# Patient Record
Sex: Male | Born: 1955
Health system: Southern US, Community
[De-identification: ages and names within clinical notes are randomized; demographics above are authoritative.]

## PROBLEM LIST (undated history)

## (undated) DIAGNOSIS — T8859XA Other complications of anesthesia, initial encounter: Secondary | ICD-10-CM

## (undated) DIAGNOSIS — M1990 Unspecified osteoarthritis, unspecified site: Secondary | ICD-10-CM

## (undated) DIAGNOSIS — K219 Gastro-esophageal reflux disease without esophagitis: Secondary | ICD-10-CM

## (undated) DIAGNOSIS — G8929 Other chronic pain: Secondary | ICD-10-CM

## (undated) DIAGNOSIS — I2510 Atherosclerotic heart disease of native coronary artery without angina pectoris: Secondary | ICD-10-CM

## (undated) DIAGNOSIS — I10 Essential (primary) hypertension: Secondary | ICD-10-CM

## (undated) DIAGNOSIS — T4145XA Adverse effect of unspecified anesthetic, initial encounter: Secondary | ICD-10-CM

## (undated) DIAGNOSIS — Z87442 Personal history of urinary calculi: Secondary | ICD-10-CM

## (undated) DIAGNOSIS — J309 Allergic rhinitis, unspecified: Secondary | ICD-10-CM

## (undated) DIAGNOSIS — D696 Thrombocytopenia, unspecified: Secondary | ICD-10-CM

## (undated) DIAGNOSIS — R519 Headache, unspecified: Secondary | ICD-10-CM

## (undated) DIAGNOSIS — E119 Type 2 diabetes mellitus without complications: Secondary | ICD-10-CM

## (undated) DIAGNOSIS — Z9889 Other specified postprocedural states: Secondary | ICD-10-CM

## (undated) DIAGNOSIS — G4730 Sleep apnea, unspecified: Secondary | ICD-10-CM

## (undated) DIAGNOSIS — R112 Nausea with vomiting, unspecified: Secondary | ICD-10-CM

## (undated) DIAGNOSIS — K648 Other hemorrhoids: Secondary | ICD-10-CM

## (undated) DIAGNOSIS — IMO0002 Reserved for concepts with insufficient information to code with codable children: Secondary | ICD-10-CM

## (undated) DIAGNOSIS — E785 Hyperlipidemia, unspecified: Secondary | ICD-10-CM

## (undated) DIAGNOSIS — R51 Headache: Secondary | ICD-10-CM

## (undated) HISTORY — DX: Other chronic pain: G89.29

## (undated) HISTORY — PX: TONSILLECTOMY AND ADENOIDECTOMY: SUR1326

## (undated) HISTORY — DX: Allergic rhinitis, unspecified: J30.9

## (undated) HISTORY — DX: Essential (primary) hypertension: I10

## (undated) HISTORY — DX: Personal history of urinary calculi: Z87.442

## (undated) HISTORY — PX: SHOULDER SURGERY: SHX246

## (undated) HISTORY — DX: Thrombocytopenia, unspecified: D69.6

## (undated) HISTORY — DX: Reserved for concepts with insufficient information to code with codable children: IMO0002

## (undated) HISTORY — PX: VASECTOMY: SHX75

## (undated) HISTORY — DX: Headache, unspecified: R51.9

## (undated) HISTORY — DX: Headache: R51

## (undated) HISTORY — DX: Type 2 diabetes mellitus without complications: E11.9

## (undated) HISTORY — DX: Other hemorrhoids: K64.8

## (undated) HISTORY — DX: Hyperlipidemia, unspecified: E78.5

## (undated) HISTORY — DX: Unspecified osteoarthritis, unspecified site: M19.90

## (undated) HISTORY — DX: Gastro-esophageal reflux disease without esophagitis: K21.9

---
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
## 1999-11-07 ENCOUNTER — Encounter: Payer: Self-pay | Admitting: Internal Medicine

## 1999-11-07 ENCOUNTER — Ambulatory Visit (HOSPITAL_COMMUNITY): Admission: RE | Admit: 1999-11-07 | Discharge: 1999-11-07 | Payer: Self-pay | Admitting: Internal Medicine

## 2000-07-31 ENCOUNTER — Ambulatory Visit (HOSPITAL_COMMUNITY): Admission: RE | Admit: 2000-07-31 | Discharge: 2000-07-31 | Payer: Self-pay | Admitting: Specialist

## 2000-07-31 ENCOUNTER — Encounter: Payer: Self-pay | Admitting: Specialist

## 2001-08-06 ENCOUNTER — Ambulatory Visit (HOSPITAL_COMMUNITY): Admission: RE | Admit: 2001-08-06 | Discharge: 2001-08-06 | Payer: Self-pay | Admitting: Internal Medicine

## 2001-08-06 ENCOUNTER — Encounter: Payer: Self-pay | Admitting: Internal Medicine

## 2001-10-04 ENCOUNTER — Ambulatory Visit (HOSPITAL_COMMUNITY): Admission: RE | Admit: 2001-10-04 | Discharge: 2001-10-04 | Payer: Self-pay | Admitting: Dentistry

## 2001-10-04 ENCOUNTER — Encounter: Payer: Self-pay | Admitting: Dentistry

## 2001-10-05 LAB — HM COLONOSCOPY: HM Colonoscopy: ABNORMAL

## 2002-09-29 ENCOUNTER — Encounter: Admission: RE | Admit: 2002-09-29 | Discharge: 2002-12-28 | Payer: Self-pay | Admitting: Internal Medicine

## 2003-09-10 ENCOUNTER — Ambulatory Visit (HOSPITAL_COMMUNITY): Admission: RE | Admit: 2003-09-10 | Discharge: 2003-09-10 | Payer: Self-pay | Admitting: Internal Medicine

## 2003-11-18 ENCOUNTER — Ambulatory Visit (HOSPITAL_COMMUNITY): Admission: RE | Admit: 2003-11-18 | Discharge: 2003-11-18 | Payer: Self-pay | Admitting: Internal Medicine

## 2006-02-17 ENCOUNTER — Ambulatory Visit: Payer: Self-pay | Admitting: Internal Medicine

## 2006-02-25 ENCOUNTER — Ambulatory Visit: Payer: Self-pay | Admitting: Internal Medicine

## 2007-02-13 ENCOUNTER — Ambulatory Visit: Payer: Self-pay | Admitting: Cardiovascular Disease

## 2007-02-13 ENCOUNTER — Ambulatory Visit: Payer: Self-pay | Admitting: Internal Medicine

## 2007-10-16 ENCOUNTER — Encounter: Admission: RE | Admit: 2007-10-16 | Discharge: 2007-10-16 | Payer: Self-pay | Admitting: Otolaryngology

## 2008-01-12 ENCOUNTER — Encounter: Payer: Self-pay | Admitting: Internal Medicine

## 2008-03-02 ENCOUNTER — Encounter: Payer: Self-pay | Admitting: *Deleted

## 2008-03-02 DIAGNOSIS — I10 Essential (primary) hypertension: Secondary | ICD-10-CM | POA: Insufficient documentation

## 2008-03-02 DIAGNOSIS — E785 Hyperlipidemia, unspecified: Secondary | ICD-10-CM | POA: Insufficient documentation

## 2008-03-02 DIAGNOSIS — J309 Allergic rhinitis, unspecified: Secondary | ICD-10-CM | POA: Insufficient documentation

## 2008-03-02 DIAGNOSIS — E119 Type 2 diabetes mellitus without complications: Secondary | ICD-10-CM | POA: Insufficient documentation

## 2008-03-02 DIAGNOSIS — IMO0002 Reserved for concepts with insufficient information to code with codable children: Secondary | ICD-10-CM | POA: Insufficient documentation

## 2008-03-02 DIAGNOSIS — M19019 Primary osteoarthritis, unspecified shoulder: Secondary | ICD-10-CM | POA: Insufficient documentation

## 2008-05-31 ENCOUNTER — Ambulatory Visit: Payer: Self-pay | Admitting: Internal Medicine

## 2008-05-31 DIAGNOSIS — K219 Gastro-esophageal reflux disease without esophagitis: Secondary | ICD-10-CM | POA: Insufficient documentation

## 2008-05-31 DIAGNOSIS — M2550 Pain in unspecified joint: Secondary | ICD-10-CM | POA: Insufficient documentation

## 2008-05-31 DIAGNOSIS — E119 Type 2 diabetes mellitus without complications: Secondary | ICD-10-CM | POA: Insufficient documentation

## 2008-07-12 ENCOUNTER — Encounter: Payer: Self-pay | Admitting: Internal Medicine

## 2008-11-21 ENCOUNTER — Ambulatory Visit: Payer: Self-pay | Admitting: Internal Medicine

## 2008-11-22 LAB — CONVERTED CEMR LAB
ALT: 31 units/L (ref 0–53)
Bilirubin, Direct: 0.2 mg/dL (ref 0.0–0.3)
CO2: 31 meq/L (ref 19–32)
Chloride: 105 meq/L (ref 96–112)
Cholesterol: 237 mg/dL (ref 0–200)
Creatinine, Ser: 1.2 mg/dL (ref 0.4–1.5)
Eosinophils Absolute: 0.1 10*3/uL (ref 0.0–0.7)
Eosinophils Relative: 2.6 % (ref 0.0–5.0)
Glucose, Bld: 114 mg/dL — ABNORMAL HIGH (ref 70–99)
MCHC: 34.2 g/dL (ref 30.0–36.0)
MCV: 97.3 fL (ref 78.0–100.0)
Microalb, Ur: 0.7 mg/dL (ref 0.0–1.9)
Monocytes Relative: 10.5 % (ref 3.0–12.0)
Neutrophils Relative %: 58.4 % (ref 43.0–77.0)
PSA: 0.59 ng/mL (ref 0.10–4.00)
Platelets: 127 10*3/uL — ABNORMAL LOW (ref 150–400)
RBC: 5.08 M/uL (ref 4.22–5.81)
RDW: 13.2 % (ref 11.5–14.6)
Total Bilirubin: 1.8 mg/dL — ABNORMAL HIGH (ref 0.3–1.2)
Total Protein: 6.6 g/dL (ref 6.0–8.3)
Triglycerides: 60 mg/dL (ref 0–149)
WBC: 5.5 10*3/uL (ref 4.5–10.5)

## 2008-11-29 ENCOUNTER — Ambulatory Visit: Payer: Self-pay | Admitting: Internal Medicine

## 2008-11-29 DIAGNOSIS — M26629 Arthralgia of temporomandibular joint, unspecified side: Secondary | ICD-10-CM | POA: Insufficient documentation

## 2008-11-29 DIAGNOSIS — M2550 Pain in unspecified joint: Secondary | ICD-10-CM

## 2008-12-07 ENCOUNTER — Encounter (INDEPENDENT_AMBULATORY_CARE_PROVIDER_SITE_OTHER): Payer: Self-pay | Admitting: *Deleted

## 2008-12-14 ENCOUNTER — Encounter: Payer: Self-pay | Admitting: Internal Medicine

## 2008-12-14 ENCOUNTER — Encounter: Admission: RE | Admit: 2008-12-14 | Discharge: 2008-12-14 | Payer: Self-pay | Admitting: Internal Medicine

## 2008-12-16 ENCOUNTER — Encounter: Payer: Self-pay | Admitting: Internal Medicine

## 2008-12-16 ENCOUNTER — Ambulatory Visit: Payer: Self-pay

## 2008-12-30 ENCOUNTER — Telehealth: Payer: Self-pay | Admitting: Internal Medicine

## 2009-02-22 ENCOUNTER — Ambulatory Visit: Payer: Self-pay | Admitting: Internal Medicine

## 2009-02-22 LAB — CONVERTED CEMR LAB
AST: 41 units/L — ABNORMAL HIGH (ref 0–37)
Albumin: 4.2 g/dL (ref 3.5–5.2)
Aldolase: 7.5 units/L (ref <=8.1)
Alkaline Phosphatase: 70 units/L (ref 39–117)
CO2: 33 meq/L — ABNORMAL HIGH (ref 19–32)
Calcium: 9.7 mg/dL (ref 8.4–10.5)
Cholesterol: 134 mg/dL (ref 0–200)
GFR calc non Af Amer: 74.46 mL/min (ref >60)
Glucose, Bld: 108 mg/dL — ABNORMAL HIGH (ref 70–99)
HDL: 52.9 mg/dL (ref >39.00)
Sodium: 143 meq/L (ref 135–145)
Total CHOL/HDL Ratio: 3
Total Protein: 7.1 g/dL (ref 6.0–8.3)
VLDL: 10 mg/dL (ref 0.0–40.0)

## 2009-03-01 ENCOUNTER — Ambulatory Visit: Payer: Self-pay | Admitting: Internal Medicine

## 2009-06-01 ENCOUNTER — Ambulatory Visit: Payer: Self-pay | Admitting: Internal Medicine

## 2009-06-01 LAB — CONVERTED CEMR LAB
ALT: 41 units/L (ref 0–53)
AST: 30 units/L (ref 0–37)
Albumin: 3.7 g/dL (ref 3.5–5.2)
BUN: 14 mg/dL (ref 6–23)
Calcium: 9.2 mg/dL (ref 8.4–10.5)
Cholesterol: 135 mg/dL (ref 0–200)
Creatinine, Ser: 1.2 mg/dL (ref 0.4–1.5)
GFR calc non Af Amer: 81.58 mL/min (ref >60)
LDL Cholesterol: 74 mg/dL (ref 0–99)
Triglycerides: 31 mg/dL (ref 0.0–149.0)

## 2009-06-14 ENCOUNTER — Ambulatory Visit: Payer: Self-pay | Admitting: Internal Medicine

## 2009-07-25 ENCOUNTER — Ambulatory Visit: Payer: Self-pay | Admitting: Internal Medicine

## 2009-07-25 LAB — CONVERTED CEMR LAB
Sed Rate: 4 mm/hr (ref 0–22)
TSH: 0.56 microintl units/mL (ref 0.35–5.50)
Uric Acid, Serum: 6 mg/dL (ref 4.0–7.8)

## 2009-08-02 ENCOUNTER — Ambulatory Visit: Payer: Self-pay | Admitting: Internal Medicine

## 2009-09-20 ENCOUNTER — Telehealth: Payer: Self-pay | Admitting: Internal Medicine

## 2010-01-31 ENCOUNTER — Inpatient Hospital Stay (HOSPITAL_COMMUNITY): Admission: EM | Admit: 2010-01-31 | Discharge: 2010-02-02 | Payer: Self-pay | Admitting: Emergency Medicine

## 2010-01-31 ENCOUNTER — Ambulatory Visit: Payer: Self-pay | Admitting: Internal Medicine

## 2010-01-31 DIAGNOSIS — M791 Myalgia, unspecified site: Secondary | ICD-10-CM | POA: Insufficient documentation

## 2010-01-31 LAB — CONVERTED CEMR LAB: Blood Glucose, Fingerstick: 136

## 2010-02-01 ENCOUNTER — Telehealth: Payer: Self-pay | Admitting: Internal Medicine

## 2010-02-01 ENCOUNTER — Ambulatory Visit: Payer: Self-pay | Admitting: Infectious Diseases

## 2010-02-06 ENCOUNTER — Ambulatory Visit: Payer: Self-pay | Admitting: Internal Medicine

## 2010-02-06 DIAGNOSIS — D696 Thrombocytopenia, unspecified: Secondary | ICD-10-CM

## 2010-02-06 LAB — CONVERTED CEMR LAB
ALT: 113 units/L — ABNORMAL HIGH (ref 0–53)
Basophils Absolute: 0.1 10*3/uL (ref 0.0–0.1)
CO2: 32 meq/L (ref 19–32)
Eosinophils Absolute: 0.1 10*3/uL (ref 0.0–0.7)
GFR calc non Af Amer: 89.96 mL/min (ref >60)
HCT: 47.7 % (ref 39.0–52.0)
Lymphocytes Relative: 22.4 % (ref 12.0–46.0)
Lymphs Abs: 1.8 10*3/uL (ref 0.7–4.0)
MCHC: 34.4 g/dL (ref 30.0–36.0)
MCV: 94.8 fL (ref 78.0–100.0)
Neutro Abs: 5.4 10*3/uL (ref 1.4–7.7)
RBC: 5.03 M/uL (ref 4.22–5.81)
Sodium: 141 meq/L (ref 135–145)
Total Protein: 7 g/dL (ref 6.0–8.3)

## 2010-02-09 ENCOUNTER — Emergency Department (HOSPITAL_COMMUNITY): Admission: EM | Admit: 2010-02-09 | Discharge: 2010-02-10 | Payer: Self-pay | Admitting: Emergency Medicine

## 2010-02-27 ENCOUNTER — Telehealth: Payer: Self-pay | Admitting: Internal Medicine

## 2010-03-08 ENCOUNTER — Encounter: Payer: Self-pay | Admitting: Internal Medicine

## 2010-03-30 ENCOUNTER — Ambulatory Visit: Payer: Self-pay | Admitting: Internal Medicine

## 2010-03-30 ENCOUNTER — Encounter (INDEPENDENT_AMBULATORY_CARE_PROVIDER_SITE_OTHER): Payer: Self-pay | Admitting: *Deleted

## 2010-04-04 LAB — CONVERTED CEMR LAB
ALT: 33 units/L (ref 0–53)
Albumin: 4.3 g/dL (ref 3.5–5.2)
Alkaline Phosphatase: 74 units/L (ref 39–117)
Basophils Absolute: 0 10*3/uL (ref 0.0–0.1)
Bilirubin, Direct: 0.3 mg/dL (ref 0.0–0.3)
CO2: 30 meq/L (ref 19–32)
Chloride: 106 meq/L (ref 96–112)
Eosinophils Absolute: 0 10*3/uL (ref 0.0–0.7)
GFR calc non Af Amer: 77.58 mL/min (ref >60)
Lipase: 22 units/L (ref 11.0–59.0)
Lymphs Abs: 1.6 10*3/uL (ref 0.7–4.0)
MCHC: 34.5 g/dL (ref 30.0–36.0)
MCV: 96.1 fL (ref 78.0–100.0)
Monocytes Absolute: 0.6 10*3/uL (ref 0.1–1.0)
Platelets: 133 10*3/uL — ABNORMAL LOW (ref 150.0–400.0)
Potassium: 3.9 meq/L (ref 3.5–5.1)
RBC: 4.95 M/uL (ref 4.22–5.81)
RDW: 15 % — ABNORMAL HIGH (ref 11.5–14.6)
Sed Rate: 7 mm/hr (ref 0–22)
Sodium: 143 meq/L (ref 135–145)

## 2010-04-13 ENCOUNTER — Encounter: Payer: Self-pay | Admitting: Internal Medicine

## 2010-04-13 ENCOUNTER — Ambulatory Visit (HOSPITAL_COMMUNITY): Admission: RE | Admit: 2010-04-13 | Discharge: 2010-04-13 | Payer: Self-pay | Admitting: Internal Medicine

## 2010-04-30 ENCOUNTER — Ambulatory Visit: Payer: Self-pay | Admitting: Internal Medicine

## 2010-04-30 ENCOUNTER — Telehealth: Payer: Self-pay | Admitting: Internal Medicine

## 2010-04-30 LAB — CONVERTED CEMR LAB
BUN: 12 mg/dL (ref 6–23)
Bilirubin, Direct: 0.2 mg/dL (ref 0.0–0.3)
Creatinine, Ser: 1.2 mg/dL (ref 0.4–1.5)
GFR calc non Af Amer: 81.3 mL/min (ref >60)
Glucose, Bld: 100 mg/dL — ABNORMAL HIGH (ref 70–99)
Total Bilirubin: 1.6 mg/dL — ABNORMAL HIGH (ref 0.3–1.2)

## 2010-05-02 ENCOUNTER — Telehealth: Payer: Self-pay | Admitting: Internal Medicine

## 2010-05-03 ENCOUNTER — Telehealth: Payer: Self-pay | Admitting: Internal Medicine

## 2010-05-22 ENCOUNTER — Ambulatory Visit: Payer: Self-pay | Admitting: Internal Medicine

## 2010-05-28 ENCOUNTER — Telehealth: Payer: Self-pay | Admitting: Internal Medicine

## 2010-06-02 ENCOUNTER — Encounter: Payer: Self-pay | Admitting: Internal Medicine

## 2010-06-13 ENCOUNTER — Ambulatory Visit (HOSPITAL_COMMUNITY)
Admission: RE | Admit: 2010-06-13 | Discharge: 2010-06-13 | Payer: Self-pay | Source: Home / Self Care | Admitting: Internal Medicine

## 2010-08-13 ENCOUNTER — Ambulatory Visit: Payer: Self-pay | Admitting: Internal Medicine

## 2010-08-13 LAB — CONVERTED CEMR LAB
ALT: 27 units/L (ref 0–53)
AST: 27 units/L (ref 0–37)
Albumin: 3.9 g/dL (ref 3.5–5.2)
Alkaline Phosphatase: 72 units/L (ref 39–117)
BUN: 16 mg/dL (ref 6–23)
Basophils Absolute: 0 10*3/uL (ref 0.0–0.1)
Basophils Relative: 0.3 % (ref 0.0–3.0)
Bilirubin, Direct: 0.2 mg/dL (ref 0.0–0.3)
CO2: 30 meq/L (ref 19–32)
Calcium: 10 mg/dL (ref 8.4–10.5)
Chloride: 107 meq/L (ref 96–112)
Creatinine, Ser: 1.3 mg/dL (ref 0.4–1.5)
Eosinophils Absolute: 0.1 10*3/uL (ref 0.0–0.7)
Eosinophils Relative: 1.2 % (ref 0.0–5.0)
GFR calc non Af Amer: 72.75 mL/min (ref >60)
Glucose, Bld: 117 mg/dL — ABNORMAL HIGH (ref 70–99)
HCT: 47.4 % (ref 39.0–52.0)
Hemoglobin: 16.3 g/dL (ref 13.0–17.0)
Hgb A1c MFr Bld: 6.4 % (ref 4.6–6.5)
Lymphocytes Relative: 27.3 % (ref 12.0–46.0)
Lymphs Abs: 1.4 10*3/uL (ref 0.7–4.0)
MCHC: 34.4 g/dL (ref 30.0–36.0)
MCV: 96.1 fL (ref 78.0–100.0)
Monocytes Absolute: 0.6 10*3/uL (ref 0.1–1.0)
Monocytes Relative: 10.8 % (ref 3.0–12.0)
Neutro Abs: 3.2 10*3/uL (ref 1.4–7.7)
Neutrophils Relative %: 60.4 % (ref 43.0–77.0)
Platelets: 126 10*3/uL — ABNORMAL LOW (ref 150.0–400.0)
Potassium: 5.7 meq/L — ABNORMAL HIGH (ref 3.5–5.1)
RBC: 4.93 M/uL (ref 4.22–5.81)
RDW: 14.4 % (ref 11.5–14.6)
Sodium: 144 meq/L (ref 135–145)
Total Bilirubin: 1.5 mg/dL — ABNORMAL HIGH (ref 0.3–1.2)
Total Protein: 6.8 g/dL (ref 6.0–8.3)
Vitamin B-12: 261 pg/mL (ref 211–911)
WBC: 5.2 10*3/uL (ref 4.5–10.5)

## 2010-11-23 ENCOUNTER — Encounter: Payer: Self-pay | Admitting: Internal Medicine

## 2010-12-04 NOTE — Assessment & Plan Note (Signed)
Summary: FU FROM APPT W/DR NORINS--STC   Vital Signs:  Patient profile:   55 year old male Height:      69 inches Weight:      191 pounds BMI:     28.31 O2 Sat:      96 % on Room air Temp:     97.9 degrees F oral Pulse rate:   68 / minute Pulse rhythm:   regular Resp:     16 per minute BP sitting:   130 / 90  (left arm) Cuff size:   regular  Vitals Entered By: Lanier Prude, CMA(AAMA) (May 22, 2010 3:56 PM)  O2 Flow:  Room air CC: f/u Comments Needs Rf on Metoprolol and NEW rx for Norvasc 10mg  1 once daily.   Pt needs gastric emptying study scheduled   Primary Care Provider:  Tresa Garter MD  CC:  f/u.  History of Present Illness: F/u nausea w/ vomiting, bloating - better F/u HTN, DM C/o L side tingling, tripping L foot  Current Medications (verified): 1)  Metoprolol Succinate 200 Mg  Tb24 (Metoprolol Succinate) .... Once Daily 2)  Glucophage 500 Mg  Tabs (Metformin Hcl) .... Take Once Daily 3)  Norvasc 5 Mg  Tabs (Amlodipine Besylate) .... 2 Daily 4)  Sumatriptan Succinate 100 Mg Tabs (Sumatriptan Succinate) .Marland Kitchen.. 1 By Mouth Once Daily As Needed For Migraine 5)  Metoclopramide Hcl 10 Mg Tabs (Metoclopramide Hcl) .Marland Kitchen.. 1 Before Meals and At Bedtime 6)  Promethazine Hcl 25 Mg Tabs (Promethazine Hcl) .Marland Kitchen.. 1-2 By Mouth Four Times A Day As Needed Nausea 7)  Nexium 40 Mg Cpdr (Esomeprazole Magnesium) .... One By Mouth Two Times A Day  Allergies (verified): 1)  ! Diovan 2)  ! * Caduet 3)  ! Pravachol  Past History:  Social History: Last updated: 06/14/2009 Occupation: office Married Never Smoked  Past Medical History: DEGENERATIVE DISC DISEASE (ICD-722.6) Hx of POLYP, COLON (ICD-211.3) UROLITHIASIS, HX OF (ICD-V13.01) Hx of ACID REFLUX DISEASE (ICD-530.81) ALLERGIC RHINITIS (ICD-477.9) Hx of DIABETES MELLITUS (ICD-250.00) DYSLIPIDEMIA (ICD-272.4) Hx of ARTHRITIS, SHOULDER (ICD-716.91) orto at [facility] HYPERTENSION (ICD-401.9)   Diabetes mellitus, type  II Hyperlipidemia Hypertension GERD Osteoarthritis Gastroparesis 2011  Review of Systems  The patient denies fever, chest pain, abdominal pain, and hematochezia.    Physical Exam  General:  WNWD AA male in no acute distress Nose:  External nasal examination shows no deformity or inflammation. Nasal mucosa are pink and moist without lesions or exudates. Mouth:  Nl throat mucosa and intranasal mucosa.  Lungs:  normal respiratory effort and normal breath sounds.   Heart:  normal rate, regular rhythm, no murmur, and no gallop.   Abdomen:  Soft, very hypoactive bowel sounds. No palpable liver edge. Non tender to palpation RUQ No stigmata on the abdominal wall.  Msk:  normal ROM, no joint tenderness, and no joint warmth.  Large wallet is in L rear pocket Neurologic:  alert & oriented X3, cranial nerves II-XII intact, and gait normal.     Impression & Recommendations:  Problem # 1:  VOMITING (ICD-787.03) Assessment Improved  Orders: Radiology Referral (Radiology)  Problem # 2:  DYSPHAGIA UNSPECIFIED (ICD-787.20) Assessment: Improved  Orders: Radiology Referral (Radiology)  Problem # 3:  DIABETES MELLITUS, TYPE II (ICD-250.00)  His updated medication list for this problem includes:    Glucophage 500 Mg Tabs (Metformin hcl) .Marland Kitchen... Take once daily  Orders: Radiology Referral (Radiology)  Problem # 4:  GERD (ICD-530.81)  His updated medication list for this problem  includes:    Nexium 40 Mg Cpdr (Esomeprazole magnesium) ..... One by mouth two times a day  Problem # 5:  Likely gastroparesis causing #1-2 Assessment: Comment Only See orders  Problem # 6:  LEG PAIN (ICD-729.5) L - poss schiatica Assessment: New Remove wallet from L rear pocket  Complete Medication List: 1)  Metoprolol Succinate 200 Mg Tb24 (Metoprolol succinate) .... Once daily 2)  Glucophage 500 Mg Tabs (Metformin hcl) .... Take once daily 3)  Sumatriptan Succinate 100 Mg Tabs (Sumatriptan succinate)  .Marland Kitchen.. 1 by mouth once daily as needed for migraine 4)  Metoclopramide Hcl 10 Mg Tabs (Metoclopramide hcl) .Marland Kitchen.. 1 before meals and at bedtime 5)  Promethazine Hcl 25 Mg Tabs (Promethazine hcl) .Marland Kitchen.. 1-2 by mouth four times a day as needed nausea 6)  Nexium 40 Mg Cpdr (Esomeprazole magnesium) .... One by mouth two times a day 7)  Amlodipine Besylate 10 Mg Tabs (Amlodipine besylate) .Marland Kitchen.. 1 by mouth once daily for blood pressure  Patient Instructions: 1)  Please schedule a follow-up appointment in 3 months. 2)  BMP prior to visit, ICD-9: 3)  HbgA1C prior to visit, ICD-9: 4)  B12 782.0 5)  Hepatic Panel prior to visit, ICD-9: 6)  CBC w/ Diff prior to visit, ICD-9: 995.20 250.00 Prescriptions: AMLODIPINE BESYLATE 10 MG TABS (AMLODIPINE BESYLATE) 1 by mouth once daily for blood pressure  #90 x 3   Entered and Authorized by:   Tresa Garter MD   Signed by:   Tresa Garter MD on 05/22/2010   Method used:   Print then Give to Patient   RxID:   [id number] METOPROLOL SUCCINATE 200 MG  TB24 (METOPROLOL SUCCINATE) once daily  #90 x 3   Entered and Authorized by:   Tresa Garter MD   Signed by:   Tresa Garter MD on 05/22/2010   Method used:   Print then Give to Patient   RxID:   [id number] METOPROLOL SUCCINATE 200 MG  TB24 (METOPROLOL SUCCINATE) once daily  #90 x 3   Entered and Authorized by:   Tresa Garter MD   Signed by:   Tresa Garter MD on 05/22/2010   Method used:   Faxed to ...       [facility] (mail-order)             , [place]         Ph: [phone]       Fax: [phone]   RxID:   [id number] AMLODIPINE BESYLATE 10 MG TABS (AMLODIPINE BESYLATE) 1 by mouth once daily for blood pressure  #90 x 3   Entered and Authorized by:   Tresa Garter MD   Signed by:   Tresa Garter MD on 05/22/2010   Method used:   Faxed to ...       [facility] (mail-order)             , [place]         Ph: [phone]       Fax: [phone]   RxID:    [id number]

## 2010-12-04 NOTE — Letter (Signed)
Summary: [facility]   Imported By: Lester Ava 03/21/2010 08:25:05  _____________________________________________________________________  External Attachment:    Type:   Image     Comment:   External Document

## 2010-12-04 NOTE — Progress Notes (Signed)
Summary: metoprolol/amlodipine  Phone Note Refill Request Message from:  Fax from Pharmacy on February 27, 2010 2:50 PM  Refills Requested: Medication #1:  METOPROLOL SUCCINATE 200 MG  TB24 once daily   Dosage confirmed as above?Dosage Confirmed  Medication #2:  NORVASC 5 MG  TABS Take once daily   Dosage confirmed as above?Dosage Confirmed  Method Requested: Fax to [facility] Initial call taken by: Brenton Grills,  February 27, 2010 2:51 PM Caller: [facility]*    Prescriptions: NORVASC 5 MG  TABS (AMLODIPINE BESYLATE) Take once daily  #90 x 3   Entered by:   Lucious Groves   Authorized by:   Tresa Garter MD   Signed by:   Lucious Groves on 02/27/2010   Method used:   Faxed to ...       [facility] (mail-order)             ,          Ph: [phone]       Fax: [phone]   RxID:   [id number] METOPROLOL SUCCINATE 200 MG  TB24 (METOPROLOL SUCCINATE) once daily  #90 x 3   Entered by:   Lucious Groves   Authorized by:   Tresa Garter MD   Signed by:   Lucious Groves on 02/27/2010   Method used:   Faxed to ...       [facility] (mail-order)             ,          Ph: [phone]       Fax: [phone]   RxID:   [id number]

## 2010-12-04 NOTE — Assessment & Plan Note (Signed)
Summary: FU  #  STC   Vital Signs:  Patient profile:   55 year old male Weight:      192 pounds BMI:     28.46 Temp:     102 degrees F oral Pulse rate:   84 / minute BP sitting:   114 / 86  (left arm)  Vitals Entered By: Tora Perches (January 31, 2010 9:11 AM) CC: f/u  pt c/o not feeling well--fever of 102/vg Is Patient Diabetic? Yes CBG Result 136   CC:  f/u  pt c/o not feeling well--fever of 102/vg.  History of Present Illness: C/o HA 6/10, fever, chills x 4 d, myalgia  Nauseated;  very weak  Preventive Screening-Counseling & Management  Alcohol-Tobacco     Smoking Status: never  Current Medications (verified): 1)  Metoprolol Succinate 200 Mg  Tb24 (Metoprolol Succinate) .... Once Daily 2)  Glucophage 500 Mg  Tabs (Metformin Hcl) .... Take Once Daily 3)  Norvasc 5 Mg  Tabs (Amlodipine Besylate) .... Take Once Daily 4)  Omeprazole 20 Mg Cpdr (Omeprazole) .... One By Mouth Daily 5)  Vitamin D3 1000 Unit  Tabs (Cholecalciferol) .... 2 Tabs By Mouth Daily 6)  Crestor 40 Mg Tabs (Rosuvastatin Calcium) .Marland Kitchen.. 1 Tablet By Mouth Daily 7)  Ibuprofen 600 Mg  Tabs (Ibuprofen) .Marland Kitchen.. 1 By Mouth Two Times A Day As Needed By Mouth Pc Prn  Allergies: 1)  ! Diovan 2)  ! * Caduet 3)  ! Pravachol  Past History:  Past Medical History: Last updated: 11/29/2008 DEGENERATIVE DISC DISEASE (ICD-722.6) Hx of POLYP, COLON (ICD-211.3) UROLITHIASIS, HX OF (ICD-V13.01) Hx of ACID REFLUX DISEASE (ICD-530.81) ALLERGIC RHINITIS (ICD-477.9) Hx of DIABETES MELLITUS (ICD-250.00) DYSLIPIDEMIA (ICD-272.4) Hx of ARTHRITIS, SHOULDER (ICD-716.91) orto at [facility] HYPERTENSION (ICD-401.9)   Diabetes mellitus, type II Hyperlipidemia Hypertension GERD Osteoarthritis  Past Surgical History: Last updated: 03/02/2008 TONSILLECTOMY AND ADENOIDECTOMY, HX OF (ICD-V45.79) POLYPECTOMY, HX OF (ICD-V15.9)     Family History: Last updated: 06/14/2009 Family History Hypertension OA in the  family  Social History: Last updated: 06/14/2009 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  The patient denies chest pain, syncope, dyspnea on exertion, prolonged cough, and abdominal pain.    Physical Exam  General:  Very tired, ill Head:  Normocephalic and atraumatic without obvious abnormalities. No apparent alopecia or balding. Ears:  External ear exam shows no significant lesions or deformities.  Otoscopic examination reveals clear canals, tympanic membranes are intact bilaterally without bulging, retraction, inflammation or discharge. Hearing is grossly normal bilaterally. Mouth:  Erythematous throat mucosa and intranasal erythema.  Lungs:  Normal respiratory effort, chest expands symmetrically. Lungs are clear to auscultation, no crackles or wheezes. Heart:  Normal rate and regular rhythm. S1 and S2 normal without gallop, murmur, click, rub or other extra sounds. Abdomen:  Bowel sounds positive,abdomen soft and non-tender without masses, organomegaly or hernias noted. Msk:  Lumbar-sacral spine is tender to palpation over paraspinal muscles and painfull with the ROM  Neurologic:  Photophobic.No cranial nerve deficits noted. Sensory, motor and coordinative functions appear intact. No meningeal signs. Skin:  Clear   Impression & Recommendations:  Problem # 1:  HEADACHE (ICD-784.0) severe Assessment New Flu vs viral meningitis. Dem/phen given. CT head/LP. Tamiflu. Spoke w/[facility] - adviced ER transfer  The office visit took longer than 45 min with patient councelling for more than 50% of the 45 min  Problem # 2:  MYALGIA (ICD-729.1) Assessment: New  as above  Problem # 3:  FEVER UNSPECIFIED (ICD-780.60) Assessment: New  Problem # 4:  NAUSEA (ICD-787.02) Assessment: New  Complete Medication List: 1)  Metoprolol Succinate 200 Mg Tb24 (Metoprolol succinate)  .... Once daily 2)  Glucophage 500 Mg Tabs (Metformin hcl) .... Take once daily 3)  Norvasc 5 Mg Tabs (Amlodipine besylate) .... Take once daily 4)  Omeprazole 20 Mg Cpdr (Omeprazole) .... One by mouth daily 5)  Vitamin D3 1000 Unit Tabs (Cholecalciferol) .... 2 tabs by mouth daily 6)  Crestor 40 Mg Tabs (Rosuvastatin calcium) .Marland Kitchen.. 1 tablet by mouth daily 7)  Ibuprofen 600 Mg Tabs (Ibuprofen) .Marland Kitchen.. 1 by mouth two times a day as needed by mouth pc prn  Other Orders: Demerol  100mg   Injection (Z6109) Promethazine up to 50mg  (J2550) Admin of Therapeutic Inj  intramuscular or subcutaneous (60454) Capillary Blood Glucose/CBG (09811)   Medication Administration  Injection # 1:    Medication: Demerol  100mg   Injection    Diagnosis: HEADACHE (ICD-784.0)    Route: IM    Site: LUOQ gluteus    Exp Date: 09/05/2011    Lot #: [id number]    Mfr: [email or web address]    Comments: 50mg  given     Patient tolerated injection without complications    Given by: Tora Perches (January 31, 2010 10:23 AM)  Injection # 2:    Medication: Promethazine up to 50mg     Diagnosis: HEADACHE (ICD-784.0)    Route: IM    Site: LUOQ gluteus    Exp Date: 02/2010    Lot #: [id number]    Mfr: [email or web address]    Patient tolerated injection without complications    Given by: Tora Perches (January 31, 2010 10:25 AM)  Orders Added: 1)  Demerol  100mg   Injection [J2175] 2)  Promethazine up to 50mg  [J2550] 3)  Admin of Therapeutic Inj  intramuscular or subcutaneous [96372] 4)  Capillary Blood Glucose/CBG [82948] 5)  Est. Patient Level V [95621]

## 2010-12-04 NOTE — Progress Notes (Signed)
  Phone Note Refill Request Message from:  Fax from Pharmacy on May 03, 2010 11:43 AM  Refills Requested: Medication #1:  METOCLOPRAMIDE HCL 10 MG TABS 1 before meals and at bedtime Initial call taken by: Ami Bullins CMA,  May 03, 2010 11:43 AM    Prescriptions: METOCLOPRAMIDE HCL 10 MG TABS (METOCLOPRAMIDE HCL) 1 before meals and at bedtime  #120 x 0   Entered by:   Ami Bullins CMA   Authorized by:   Tresa Garter MD   Signed by:   Bill Salinas CMA on 05/03/2010   Method used:   Electronically to        [phone]* (retail)       [address]       [place], [place]  [address]       Ph: [phone]       Fax: [phone]   RxID:   [id number]

## 2010-12-04 NOTE — Assessment & Plan Note (Signed)
Summary: INDIGESTION W/NAUSEA/PLOT/CD   Vital Signs:  Patient profile:   55 year old male Height:      69 inches Weight:      192 pounds BMI:     28.46 O2 Sat:      97 % on Room air Temp:     98.2 degrees F oral Pulse rate:   63 / minute BP sitting:   148 / 110  (left arm) Cuff size:   regular  Vitals Entered By: Bill Salinas CMA (April 30, 2010 3:38 PM)  O2 Flow:  Room air CC: pt here with c/o indigestion and nausea with discomfort in rib cage area on both sides/ ab   Primary Care Provider:  Tresa Garter MD  CC:  pt here with c/o indigestion and nausea with discomfort in rib cage area on both sides/ ab.  History of Present Illness: Patient presents with a 3 day history of bilateral upper abdominal pain up under the ribs, loose stools 2 times a day - no blood or mucus in the stool, normal color, and intermittent nausea. He has been taking metoclopromide which does help. He also has had a lot of eructation.   He had an episode of chest and back pain in April '11 - he had an admission for 3/30-4/1. He had an extensive work-up: negative for toxo, CMV, Syphyllis, HIV, acute hepatitis, Acute EBV. CSF was negative. CT abd/pelvis - negative except for small stones, CT brain negative, U/S - negative. Post-hospital 4/5 lab- platelet count returned to normal, WBC normal with normal diff., LFTs remained elevated.  He had rcurrent abdominal pain in late april and was seen by Dr. Isabel Caprice May 5th - negative eval and no indication renal colic.   He has recently on Amoxicillin p;rescribed by his dentist for a possible sinus infection.   Current Medications (verified): 1)  Metoprolol Succinate 200 Mg  Tb24 (Metoprolol Succinate) .... Once Daily 2)  Glucophage 500 Mg  Tabs (Metformin Hcl) .... Take Once Daily 3)  Norvasc 5 Mg  Tabs (Amlodipine Besylate) .... Take Once Daily 4)  Sumatriptan Succinate 100 Mg Tabs (Sumatriptan Succinate) .Marland Kitchen.. 1 By Mouth Once Daily As Needed For Migraine 5)   Metoclopramide Hcl 10 Mg Tabs (Metoclopramide Hcl) .Marland Kitchen.. 1 Tab Q 4 Hours As Needed 6)  Promethazine Hcl 25 Mg Tabs (Promethazine Hcl) .Marland Kitchen.. 1-2 By Mouth Four Times A Day As Needed Nausea 7)  Nexium 40 Mg Cpdr (Esomeprazole Magnesium) .... One By Mouth Two Times A Day  Allergies (verified): 1)  ! Diovan 2)  ! * Caduet 3)  ! Pravachol  Past History:  Past Medical History: Last updated: 11/29/2008 DEGENERATIVE DISC DISEASE (ICD-722.6) Hx of POLYP, COLON (ICD-211.3) UROLITHIASIS, HX OF (ICD-V13.01) Hx of ACID REFLUX DISEASE (ICD-530.81) ALLERGIC RHINITIS (ICD-477.9) Hx of DIABETES MELLITUS (ICD-250.00) DYSLIPIDEMIA (ICD-272.4) Hx of ARTHRITIS, SHOULDER (ICD-716.91) orto at [facility] HYPERTENSION (ICD-401.9)   Diabetes mellitus, type II Hyperlipidemia Hypertension GERD Osteoarthritis  Past Surgical History: Last updated: 03/02/2008 TONSILLECTOMY AND ADENOIDECTOMY, HX OF (ICD-V45.79) POLYPECTOMY, HX OF (ICD-V15.9)     Family History: Last updated: 06/14/2009 Family History Hypertension OA in the family  Social History: Last updated: 06/14/2009 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  The patient denies fever, weight loss, weight gain, vision loss, hoarseness, chest pain, syncope, dyspnea on exertion, peripheral edema, melena, hematochezia, severe indigestion/heartburn, incontinence, muscle weakness, abnormal bleeding, and enlarged lymph nodes.  eructation, early satiety, bloating and slow transit. GI:  Complains of abdominal pain, change in bowel habits, diarrhea, and loss of appetite.  Physical Exam  General:  WNWD AA male in no acute distress Head:  Normocephalic and atraumatic without obvious abnormalities. No apparent alopecia or balding. Eyes:  corneas and lenses clear.  No scleral icterus Lungs:  normal respiratory effort and normal breath sounds.   Heart:  normal rate, regular  rhythm, no murmur, and no gallop.   Abdomen:  Soft, very hypoactive bowel sounds. No palpable liver edge but tender to palpation RUQ and to percussion over the lower right anterior chest wall. No stigmata on the abdominal wall.  Msk:  normal ROM, no joint tenderness, and no joint warmth.   Pulses:  2+ radial Neurologic:  alert & oriented X3, cranial nerves II-XII intact, and gait normal.   Skin:  turgor normal, color normal, no rashes, and no suspicious lesions.   Cervical Nodes:  no anterior cervical adenopathy and no posterior cervical adenopathy.   Psych:  Oriented X3, normally interactive, and good eye contact.     Impression & Recommendations:  Problem # 1:  NAUSEA (ICD-787.02) Patient with a set of symptoms suggestive of poor GI function: gastroparesis, hepatitis, acid related dyspepsia. He has had eval for swallowing difficulty - barium swallow done, report from speech path not located.   Plan - f/u LFTs, if elevated - chronic Hep screen           Patient to discuss gastric emptying scan with [name]           continue reglan ac/hs  Orders: TLB-Hepatic/Liver Function Pnl (80076-HEPATIC) TLB-BMP (Basic Metabolic Panel-BMET) (80048-METABOL)  Addendum - LFTs normal, Bmet essentially normal  Problem # 2:  GERD (ICD-530.81) Symptoms could be related to acid related disease.  Plan - continue with nexium.  His updated medication list for this problem includes:    Nexium 40 Mg Cpdr (Esomeprazole magnesium) ..... One by mouth two times a day  Problem # 3:  HYPERTENSION (ICD-401.9)  His updated medication list for this problem includes:    Metoprolol Succinate 200 Mg Tb24 (Metoprolol succinate) ..... Once daily    Norvasc 5 Mg Tabs (Amlodipine besylate) .Marland Kitchen... Take once daily  BP today: 148/110 Prior BP: 138/90 (03/30/2010)  Labs Reviewed: K+: 3.9 (03/30/2010)  BP excessively elevated today.  Plan -continue present meds.             monitor BP at home and call if SBP  140+, DBP 100+  Problem # 4:  THROMBOCYTOPENIA (ICD-287.5) Low platelet count while in hospital  Repeat platelet April 5th - was normal.   Complete Medication List: 1)  Metoprolol Succinate 200 Mg Tb24 (Metoprolol succinate) .... Once daily 2)  Glucophage 500 Mg Tabs (Metformin hcl) .... Take once daily 3)  Norvasc 5 Mg Tabs (Amlodipine besylate) .... Take once daily 4)  Sumatriptan Succinate 100 Mg Tabs (Sumatriptan succinate) .Marland Kitchen.. 1 by mouth once daily as needed for migraine 5)  Metoclopramide Hcl 10 Mg Tabs (Metoclopramide hcl) .Marland Kitchen.. 1 tab q 4 hours as needed 6)  Promethazine Hcl 25 Mg Tabs (Promethazine hcl) .Marland Kitchen.. 1-2 by mouth four times a day as needed nausea 7)  Nexium 40 Mg Cpdr (Esomeprazole magnesium) .... One by mouth two times a day   tient: Less Reller Note: All result statuses are Final unless otherwise noted.  Tests: (1) Hepatic/Liver Function Panel (HEPATIC)   Total Bilirubin      [H]  1.6  mg/dL                   8.1-1.9   Direct Bilirubin          0.2 mg/dL                   1.4-7.8   Alkaline Phosphatase      74 U/L                      39-117   AST                       28 U/L                      0-37   ALT                       34 U/L                      0-53   Total Protein             7.6 g/dL                    2.9-5.6   Albumin                   4.2 g/dL                    2.1-3.0  Tests: (2) BMP (METABOL)   Sodium                    142 mEq/L                   135-145   Potassium                 3.8 mEq/L                   3.5-5.1   Chloride                  104 mEq/L                   96-112   Carbon Dioxide            32 mEq/L                    19-32   Glucose              [H]  100 mg/dL                   86-57   BUN                       12 mg/dL                    8-46

## 2010-12-04 NOTE — Assessment & Plan Note (Signed)
Summary: 3 MO ROV /NWS  #   Vital Signs:  Patient profile:   55 year old male Height:      69 inches Weight:      195 pounds BMI:     28.90 Temp:     98.1 degrees F oral Pulse rate:   84 / minute Pulse rhythm:   regular Resp:     16 per minute BP sitting:   120 / 88  (left arm) Cuff size:   regular  Vitals Entered By: Lanier Prude, Beverly Gust) (August 13, 2010 2:37 PM) CC: 3 mo f/u Is Patient Diabetic? Yes Comments pt is not taking sumatriptan, metoclopramide or promethazine   Primary Care Provider:  Tresa Garter MD  CC:  3 mo f/u.  History of Present Illness: The patient presents for a follow up of hypertension, diabetes, hyperlipidemia and n/v. Doing well  Current Medications (verified): 1)  Metoprolol Succinate 200 Mg  Tb24 (Metoprolol Succinate) .... Once Daily 2)  Glucophage 500 Mg  Tabs (Metformin Hcl) .... Take Once Daily 3)  Sumatriptan Succinate 100 Mg Tabs (Sumatriptan Succinate) .Marland Kitchen.. 1 By Mouth Once Daily As Needed For Migraine 4)  Metoclopramide Hcl 10 Mg Tabs (Metoclopramide Hcl) .Marland Kitchen.. 1 Before Meals and At Bedtime 5)  Promethazine Hcl 25 Mg Tabs (Promethazine Hcl) .Marland Kitchen.. 1-2 By Mouth Four Times A Day As Needed Nausea 6)  Nexium 40 Mg Cpdr (Esomeprazole Magnesium) .... One By Mouth Two Times A Day 7)  Amlodipine Besylate 10 Mg Tabs (Amlodipine Besylate) .Marland Kitchen.. 1 By Mouth Once Daily For Blood Pressure  Allergies (verified): 1)  ! Diovan 2)  ! * Caduet 3)  ! Pravachol  Past History:  Social History: Last updated: 06/14/2009 Occupation: office Married Never Smoked  Past Medical History: DEGENERATIVE DISC DISEASE (ICD-722.6) Hx of POLYP, COLON (ICD-211.3) UROLITHIASIS, HX OF (ICD-V13.01) Hx of ACID REFLUX DISEASE (ICD-530.81) ALLERGIC RHINITIS (ICD-477.9) Hx of DIABETES MELLITUS (ICD-250.00) DYSLIPIDEMIA (ICD-272.4) Hx of ARTHRITIS, SHOULDER (ICD-716.91) orto at [facility] HYPERTENSION (ICD-401.9) Borderline low B12 2011  Diabetes mellitus, type  II Hyperlipidemia Hypertension GERD Osteoarthritis Gastroparesis 2011  Review of Systems  The patient denies weight loss, dyspnea on exertion, and abdominal pain.    Physical Exam  General:  WNWD AA male in no acute distress Eyes:  corneas and lenses clear.  No scleral icterus Ears:  External ear exam shows no significant lesions or deformities.  Otoscopic examination reveals clear canals, tympanic membranes are intact bilaterally without bulging, retraction, inflammation or discharge. Hearing is grossly normal bilaterally. Mouth:  Nl throat mucosa and intranasal mucosa.  Lungs:  normal respiratory effort and normal breath sounds.   Heart:  normal rate, regular rhythm, no murmur, and no gallop.   Abdomen:  Soft, very hypoactive bowel sounds. No palpable liver edge. Non tender to palpation RUQ No stigmata on the abdominal wall.  Msk:  normal ROM, no joint tenderness, and no joint warmth.  Large wallet is in L rear pocket Extremities:  No clubbing, cyanosis, edema, or deformity noted with normal full range of motion of all joints.   Neurologic:  alert & oriented X3, cranial nerves II-XII intact, and gait normal.   Skin:  turgor normal, color normal, no rashes, and no suspicious lesions.   Psych:  Oriented X3, normally interactive, and good eye contact.     Impression & Recommendations:  Problem # 1:  VOMITING (ICD-787.03) - resolved Assessment Comment Only  Problem # 2:  DIABETES MELLITUS, TYPE II (ICD-250.00) Assessment: Unchanged  His  updated medication list for this problem includes:    Glucophage 500 Mg Tabs (Metformin hcl) .Marland Kitchen... Take once daily  Problem # 3:  HYPERLIPIDEMIA (ICD-272.4) Assessment: Unchanged  Problem # 4:  HYPERTENSION (ICD-401.9) Assessment: Unchanged  His updated medication list for this problem includes:    Metoprolol Succinate 200 Mg Tb24 (Metoprolol succinate) ..... Once daily    Amlodipine Besylate 10 Mg Tabs (Amlodipine besylate) .Marland Kitchen... 1 by  mouth once daily for blood pressure  BP today: 120/88 Prior BP: 130/90 (05/22/2010)  Labs Reviewed: K+: 3.8 (04/30/2010) Creat: : 1.2 (04/30/2010)   Chol: 135 (06/01/2009)   HDL: 55.20 (06/01/2009)   LDL: 74 (06/01/2009)   TG: 31.0 (06/01/2009)  Complete Medication List: 1)  Metoprolol Succinate 200 Mg Tb24 (Metoprolol succinate) .... Once daily 2)  Glucophage 500 Mg Tabs (Metformin hcl) .... Take once daily 3)  Sumatriptan Succinate 100 Mg Tabs (Sumatriptan succinate) .Marland Kitchen.. 1 by mouth once daily as needed for migraine 4)  Metoclopramide Hcl 10 Mg Tabs (Metoclopramide hcl) .Marland Kitchen.. 1 before meals and at bedtime 5)  Promethazine Hcl 25 Mg Tabs (Promethazine hcl) .Marland Kitchen.. 1-2 by mouth four times a day as needed nausea 6)  Nexium 40 Mg Cpdr (Esomeprazole magnesium) .... One by mouth two times a day 7)  Amlodipine Besylate 10 Mg Tabs (Amlodipine besylate) .Marland Kitchen.. 1 by mouth once daily for blood pressure 8)  Vitamin B-12 500 Mcg Tabs (Cyanocobalamin) .Marland Kitchen.. 1 by mouth once daily for vitamin b12 deficiency 9)  Vitamin D 1000 Unit Tabs (Cholecalciferol) .Marland Kitchen.. 1 by mouth qd  Contraindications/Deferment of Procedures/Staging:    Test/Procedure: FLU VAX    Reason for deferment: patient declined   Patient Instructions: 1)  Please schedule a follow-up appointment in 4 months. well w/labs and A1c 250.00 and Vit B12 266.20 Prescriptions: VITAMIN D 1000 UNIT TABS (CHOLECALCIFEROL) 1 by mouth qd  #100 x 3   Entered and Authorized by:   Tresa Garter MD   Signed by:   Tresa Garter MD on 08/13/2010   Method used:   Print then Give to Patient   RxID:   [id number] VITAMIN B-12 500 MCG TABS (CYANOCOBALAMIN) 1 by mouth once daily for Vitamin B12 deficiency  #100 x 3   Entered and Authorized by:   Tresa Garter MD   Signed by:   Tresa Garter MD on 08/13/2010   Method used:   Print then Give to Patient   RxID:   [id number]

## 2010-12-04 NOTE — Assessment & Plan Note (Signed)
Summary: FU Jacob Gordon #   Vital Signs:  Patient profile:   55 year old male Height:      69 inches Weight:      186 pounds BMI:     27.57 O2 Sat:      97 % on Room air Temp:     97.7 degrees F oral Pulse rate:   56 / minute BP sitting:   138 / 90  (left arm) Cuff size:   large  Vitals Entered By: Bill Salinas CMA (Mar 30, 2010 8:09 AM)  O2 Flow:  Room air CC: pt here for hosp follow up,he states he has stopped glucophage,narvasc, omeprazole, ibuprofen,vit d3 and sumatriptan/ ab   CC:  pt here for hosp follow up, he states he has stopped glucophage, narvasc, omeprazole, ibuprofen, and vit d3 and sumatriptan/ ab.  History of Present Illness: C/o nausea and vomiting - fairly frequent 1 month; this am nothing stays down Meds make stomach upset He had to stop many Meds He is taking only Omeprazole in am, Metoclopr as needed and Metopr and Amlodipin at night. No pattern - some days  are better. No ASA CBGs 87 this am He had EGD 1 year ago or so.  Current Medications (verified): 1)  Metoprolol Succinate 200 Mg  Tb24 (Metoprolol Succinate) .... Once Daily 2)  Glucophage 500 Mg  Tabs (Metformin Hcl) .... Take Once Daily 3)  Norvasc 5 Mg  Tabs (Amlodipine Besylate) .... Take Once Daily 4)  Omeprazole 20 Mg Cpdr (Omeprazole) .... One By Mouth Daily 5)  Vitamin D3 1000 Unit  Tabs (Cholecalciferol) .... 2 Tabs By Mouth Daily 6)  Ibuprofen 600 Mg  Tabs (Ibuprofen) .Marland Kitchen.. 1 By Mouth Two Times A Day As Needed By Mouth Pc Prn 7)  Sumatriptan Succinate 100 Mg Tabs (Sumatriptan Succinate) .Marland Kitchen.. 1 By Mouth Once Daily As Needed For Migraine 8)  Hydrocodone-Acetaminophen 10-325 Mg Tabs (Hydrocodone-Acetaminophen) .Marland Kitchen.. 1 By Mouth Two Times A Day By Mouth Bid 9)  Promethazine Hcl 25 Mg Tabs (Promethazine Hcl) .Marland Kitchen.. 1-2 By Mouth Four Times A Day As Needed Nausea 10)  Amlodipine Besylate 5 Mg Tabs (Amlodipine Besylate) .Marland Kitchen.. 1 Tab Daily 11)  Tamsulosin Hcl 0.4 Mg Caps (Tamsulosin Hcl) .Marland Kitchen.. 1 Cap Daily 12)   Metoclopramide Hcl 10 Mg Tabs (Metoclopramide Hcl) .Marland Kitchen.. 1 Tab Q 4 Hours As Needed  Allergies (verified): 1)  ! Diovan 2)  ! * Caduet 3)  ! Pravachol  Past History:  Past Medical History: Last updated: 11/29/2008 DEGENERATIVE DISC DISEASE (ICD-722.6) Hx of POLYP, COLON (ICD-211.3) UROLITHIASIS, HX OF (ICD-V13.01) Hx of ACID REFLUX DISEASE (ICD-530.81) ALLERGIC RHINITIS (ICD-477.9) Hx of DIABETES MELLITUS (ICD-250.00) DYSLIPIDEMIA (ICD-272.4) Hx of ARTHRITIS, SHOULDER (ICD-716.91) orto at [facility] HYPERTENSION (ICD-401.9)   Diabetes mellitus, type II Hyperlipidemia Hypertension GERD Osteoarthritis  Social History: Last updated: 06/14/2009 Occupation: office Married Never Smoked  Review of Systems       The patient complains of weight loss.  The patient denies fever, dyspnea on exertion, abdominal pain, and hematochezia.    Physical Exam  General:  NAD Head:  Normocephalic and atraumatic without obvious abnormalities. No apparent alopecia or balding. Eyes:  No corneal or conjunctival inflammation noted. EOMI. Perrla. Ears:  External ear exam shows no significant lesions or deformities.  Otoscopic examination reveals clear canals, tympanic membranes are intact bilaterally without bulging, retraction, inflammation or discharge. Hearing is grossly normal bilaterally. Nose:  External nasal examination shows no deformity or inflammation. Nasal mucosa are pink and moist without lesions  or exudates. Mouth:  Nl throat mucosa and intranasal mucosa.  Neck:  No deformities, masses, or tenderness noted. Lungs:  Normal respiratory effort, chest expands symmetrically. Lungs are clear to auscultation, no crackles or wheezes. Heart:  Normal rate and regular rhythm. S1 and S2 normal without gallop, murmur, click, rub or other extra sounds. Abdomen:  Bowel sounds positive,abdomen soft and non-tender without masses, organomegaly or hernias noted. Msk:  Lumbar-sacral spine is tender to palpation  over paraspinal muscles and painfull with the ROM  Extremities:  No clubbing, cyanosis, edema, or deformity noted with normal full range of motion of all joints.   Neurologic:  Photophobic.No cranial nerve deficits noted. Sensory, motor and coordinative functions appear intact. No meningeal signs. Skin:  Clear Psych:  Cognition and judgment appear intact. Alert and cooperative with normal attention span and concentration. No apparent delusions, illusions, hallucinations   Impression & Recommendations:  Problem # 1:  VOMITING (ICD-787.03) ? etiology Assessment Deteriorated As per #5 Orders: TLB-BMP (Basic Metabolic Panel-BMET) (80048-METABOL) TLB-Hepatic/Liver Function Pnl (80076-HEPATIC) TLB-CBC Platelet - w/Differential (85025-CBCD) TLB-Sedimentation Rate (ESR) (85652-ESR) TLB-Lipase (83690-LIPASE) TLB-TSH (Thyroid Stimulating Hormone) (16109-UEA) Radiology Referral (Radiology)  Problem # 2:  GERD (ICD-530.81) Assessment: Deteriorated He had EGD  1 year ago or so... His updated medication list for this problem includes:    Omeprazole 20 Mg Cpdr (Omeprazole) ..... One by mouth daily  Problem # 3:  DIABETES MELLITUS, TYPE II (ICD-250.00) Assessment: Unchanged  His updated medication list for this problem includes:    Glucophage 500 Mg Tabs (Metformin hcl) .Marland Kitchen... Take once daily  Problem # 4:  HYPERTENSION (ICD-401.9) Assessment: Deteriorated  His updated medication list for this problem includes:    Metoprolol Succinate 200 Mg Tb24 (Metoprolol succinate) ..... Once daily    Norvasc 5 Mg Tabs (Amlodipine besylate) .Marland Kitchen... Take once daily  Orders: TLB-BMP (Basic Metabolic Panel-BMET) (80048-METABOL) TLB-Hepatic/Liver Function Pnl (80076-HEPATIC) TLB-CBC Platelet - w/Differential (85025-CBCD) TLB-Sedimentation Rate (ESR) (85652-ESR) TLB-Lipase (83690-LIPASE) TLB-TSH (Thyroid Stimulating Hormone) (84443-TSH)  Problem # 5:  DYSPHAGIA UNSPECIFIED (ICD-787.20) Assessment:  Deteriorated  He had an EGD. Empiric Diflucan Upper GI  Orders: TLB-BMP (Basic Metabolic Panel-BMET) (80048-METABOL) TLB-Hepatic/Liver Function Pnl (80076-HEPATIC) TLB-CBC Platelet - w/Differential (85025-CBCD) TLB-Sedimentation Rate (ESR) (85652-ESR) TLB-Lipase (83690-LIPASE) TLB-TSH (Thyroid Stimulating Hormone) (54098-JXB) Radiology Referral (Radiology)  Complete Medication List: 1)  Metoprolol Succinate 200 Mg Tb24 (Metoprolol succinate) .... Once daily 2)  Glucophage 500 Mg Tabs (Metformin hcl) .... Take once daily 3)  Norvasc 5 Mg Tabs (Amlodipine besylate) .... Take once daily 4)  Omeprazole 20 Mg Cpdr (Omeprazole) .... One by mouth daily 5)  Vitamin D3 1000 Unit Tabs (Cholecalciferol) .... 2 tabs by mouth daily 6)  Ibuprofen 600 Mg Tabs (Ibuprofen) .Marland Kitchen.. 1 by mouth two times a day as needed by mouth pc prn 7)  Sumatriptan Succinate 100 Mg Tabs (Sumatriptan succinate) .Marland Kitchen.. 1 by mouth once daily as needed for migraine 8)  Hydrocodone-acetaminophen 10-325 Mg Tabs (Hydrocodone-acetaminophen) .Marland Kitchen.. 1 by mouth two times a day by mouth bid 9)  Tamsulosin Hcl 0.4 Mg Caps (Tamsulosin hcl) .Marland Kitchen.. 1 cap daily 10)  Metoclopramide Hcl 10 Mg Tabs (Metoclopramide hcl) .Marland Kitchen.. 1 tab q 4 hours as needed 11)  Diflucan 100 Mg Tabs (Fluconazole) .... Take two tablets on the first day, than  1 by mouth once daily untill gone for a fungul infection 12)  Promethazine Hcl 25 Mg Tabs (Promethazine hcl) .Marland Kitchen.. 1-2 by mouth four times a day as needed nausea  Patient Instructions: 1)  Please schedule  a follow-up appointment in 4-6 weeks. 2)  Take Nexium 1 two times a day instead of Omeprazole 3)  Take Diflucan 4)  Small freq meals Prescriptions: PROMETHAZINE HCL 25 MG TABS (PROMETHAZINE HCL) 1-2 by mouth four times a day as needed nausea  #60 x 1   Entered and Authorized by:   Tresa Garter MD   Signed by:   Tresa Garter MD on 03/30/2010   Method used:   Print then Give to Patient   RxID:    [id number] DIFLUCAN 100 MG TABS (FLUCONAZOLE) Take two tablets on the first day, than  1 by mouth once daily untill gone for a fungul infection  #11 x 1   Entered and Authorized by:   Tresa Garter MD   Signed by:   Tresa Garter MD on 03/30/2010   Method used:   Print then Give to Patient   RxID:   [id number]

## 2010-12-04 NOTE — Progress Notes (Signed)
Summary: call request  Phone Note Call from Patient Call back at Home Phone [phone]   Caller: Jermiah Soderman [phone] Summary of Call: Requests that MD call her about patient be admitting to the hospital. Initial call taken by: Lucious Groves,  February 01, 2010 9:37 AM  Follow-up for Phone Call        Called and left a VM Follow-up by: Tresa Garter MD,  February 01, 2010 10:53 AM  Additional Follow-up for Phone Call Additional follow up Details #1::        Called x 2 left VM Tresa Garter MD  February 01, 2010 12:35 PM     Additional Follow-up for Phone Call Additional follow up Details #2::    spouse called back to thank MD for the call, but they have now spoke with someone and she no longer needs a call.  Follow-up by: Lucious Groves,  February 01, 2010 2:15 PM  Additional Follow-up for Phone Call Additional follow up Details #3:: Details for Additional Follow-up Action Taken: We talked Additional Follow-up by: Tresa Garter MD,  February 01, 2010 9:39 PM

## 2010-12-04 NOTE — Progress Notes (Signed)
Summary: Nexium/Plot pt  Phone Note Call from Patient   Caller: Wife: [name] [phone] [id number] Summary of Call: Pt was given nexium two times a day until GI referral. He completed samples and began to have nausea & h/a again. Ok to send in Rx?  Initial call taken by: Lamar Sprinkles, CMA,  April 30, 2010 10:07 AM  Follow-up for Phone Call        left mess to call office back...................Marland KitchenLamar Sprinkles, CMA  April 30, 2010 1:20 PM   Additional Follow-up for Phone Call Additional follow up Details #1::        Pt in office for eval from Dr Debby Bud now Additional Follow-up by: Lamar Sprinkles, CMA,  April 30, 2010 3:38 PM    New/Updated Medications: NEXIUM 40 MG CPDR (ESOMEPRAZOLE MAGNESIUM) One by mouth two times a day Prescriptions: NEXIUM 40 MG CPDR (ESOMEPRAZOLE MAGNESIUM) One by mouth two times a day  #60 x 11   Entered by:   Lamar Sprinkles, CMA   Authorized by:   Tresa Garter MD   Signed by:   Lamar Sprinkles, CMA on 04/30/2010   Method used:   Electron[facility] to        [facility] [phone][facility] (retail)       [address]       [place], [place]  [address]       Ph: [phone]       Fax: [phone]   RxID:   [id number] NEXIUM 40 MG CPDR (ESOMEPRAZOLE MAGNESIUM) One by mouth two times a day  #60 x 11   Entered and Authorized by:   Etta Grandchild MD   Signed by:   Etta Grandchild MD on 04/30/2010   Method used:   Electronically to        [facility]* (retail)             ,          Ph: [phone]       Fax: [phone]   RxID:   [id number]

## 2010-12-04 NOTE — Progress Notes (Signed)
Summary: Metformin rx  Phone Note Refill Request Message from:  Spouse on May 28, 2010 9:58 AM  Refills Requested: Medication #1:  GLUCOPHAGE 500 MG  TABS Take once daily   Notes: [facility] Spouse--Rebecca [phone].  Initial call taken by: Lucious Groves CMA,  May 28, 2010 9:58 AM  Follow-up for Phone Call        Spoke with spouse and patient only has #5 at home. Sent prescription to local pharmacy also. Follow-up by: Lucious Groves CMA,  May 28, 2010 10:21 AM    Prescriptions: GLUCOPHAGE 500 MG  TABS (METFORMIN HCL) Take once daily  #14 x 0   Entered by:   Lucious Groves CMA   Authorized by:   Tresa Garter MD   Signed by:   Lucious Groves CMA on 05/28/2010   Method used:   Electronically to        [facility] [phone]* (retail)       [address]       [place], [place]  [address]       Ph: [phone]       Fax: [phone]   RxID:   [id number] GLUCOPHAGE 500 MG  TABS (METFORMIN HCL) Take once daily  #90 x 3   Entered by:   Lucious Groves CMA   Authorized by:   Tresa Garter MD   Signed by:   Lucious Groves CMA on 05/28/2010   Method used:   Faxed to ...       [facility] (mail-order)             , [place]         Ph: [phone]       Fax: [phone]   RxID:   [id number]

## 2010-12-04 NOTE — Letter (Signed)
Summary: Call A Nurse  Call A Nurse   Imported By: Sherian Rein 06/07/2010 13:40:06  _____________________________________________________________________  External Attachment:    Type:   Image     Comment:   External Document

## 2010-12-04 NOTE — Progress Notes (Signed)
Summary: Blood Pressure  Phone Note Call from Patient   Summary of Call: Patient spouse called with BP readings. Last night at 8:40pm his BP was 143/88 and this morning at 6:29am it was 157/93. She would like to know if her husband should see someone else to get his BP regulated b/c Plotnikov cannot see him until the 19th. Please advise. Initial call taken by: Lucious Groves,  May 02, 2010 8:42 AM  Follow-up for Phone Call        1) these readings are not too bad 2) increase norvasc to 10mg  (2x5mg ) once a day 3) if after 2-3 days SBP 140+, DBP 90+ add furosemide 20mg  once a day, #30  Follow-up by: Jacques Navy MD,  May 02, 2010 1:47 PM  Additional Follow-up for Phone Call Additional follow up Details #1::        Pt's wife informed. EMR Updated. She will call back w/update of bp readings to determine if he needs additional furosemide.  Additional Follow-up by: Lamar Sprinkles, CMA,  May 02, 2010 4:57 PM    New/Updated Medications: NORVASC 5 MG  TABS (AMLODIPINE BESYLATE) 2 daily METOCLOPRAMIDE HCL 10 MG TABS (METOCLOPRAMIDE HCL) 1 before meals and at bedtime Prescriptions: METOCLOPRAMIDE HCL 10 MG TABS (METOCLOPRAMIDE HCL) 1 before meals and at bedtime  #120 x 0   Entered by:   Lamar Sprinkles, CMA   Authorized by:   Tresa Garter MD   Signed by:   Lamar Sprinkles, CMA on 05/02/2010   Method used:   Electronically to        [facility] [phone]* (retail)       [address]       [place], [place]  [address]       Ph: [phone]       Fax: [phone]   RxID:   [id number]

## 2010-12-04 NOTE — Letter (Signed)
Summary: New Patient letter  [facility]  [address] [place], [place] [address]   Phone: [phone]  Fax: [phone]       03/30/2010 MRN: [id number]  Jacob Gordon [address] [place], [place]  [address]  Dear Jacob Gordon,  Welcome to the Gastroenterology Division at [facility].    You are scheduled to see Dr. Jarold Motto on 05-03-10 at 10:00a.m. on the 3rd floor at [facility], [address]  We ask that you try to arrive at our office 15 minutes prior to your appointment time to allow for check-in.  We would like you to complete the enclosed self-administered evaluation form prior to your visit and bring it with you on the day of your appointment.  We will review it with you.  Also, please bring a complete list of all your medications or, if you prefer, bring the medication bottles and we will list them.  Please bring your insurance card so that we may make a copy of it.  If your insurance requires a referral to see a specialist, please bring your referral form from your primary care physician.  Co-payments are due at the time of your visit and may be paid by cash, check or credit card.     Your office visit will consist of a consult with your physician (includes a physical exam), any laboratory testing he/she may order, scheduling of any necessary diagnostic testing (e.g. x-ray, ultrasound, CT-scan), and scheduling of a procedure (e.g. Endoscopy, Colonoscopy) if required.  Please allow enough time on your schedule to allow for any/all of these possibilities.    If you cannot keep your appointment, please call [phone] to cancel or reschedule prior to your appointment date.  This allows [place] the opportunity to schedule an appointment for another patient in need of care.  If you do not cancel or reschedule by 5 p.m. the business day prior to your appointment date, you will be charged a $50.00 late cancellation/no-show fee.    Thank you for choosing  [facility] for your medical needs.  We appreciate the opportunity to care for you.  Please visit [place] at our website  to learn more about our practice.                     Sincerely,                                                             The Gastroenterology Division

## 2010-12-04 NOTE — Assessment & Plan Note (Signed)
Summary: POST HOSP WES LONG/#/CD   Vital Signs:  Patient profile:   55 year old male Height:      69 inches Weight:      191 pounds BMI:     28.31 O2 Sat:      97 % on Room air Temp:     97.9 degrees F oral Pulse rate:   68 / minute BP sitting:   112 / 70  (left arm) Cuff size:   large  Vitals Entered By: Lucious Groves (February 06, 2010 10:36 AM)  O2 Flow:  Room air CC: Hosp f/u--Pt states that is still having HA, sensitivity to light, and back pain./kb Is Patient Diabetic? Yes Did you bring your meter with you today? No Pain Assessment Patient in pain? yes     Location: back Intensity: 5 Type: dull Onset of pain  This AM pain was a 7 of 10. Comments Patient denies abd pain, fever, and N&V./kb   CC:  Hosp f/u--Pt states that is still having HA, sensitivity to light, and and back pain./kb.  History of Present Illness: The patient presents for a post-hospital visit for   DISCHARGE DIAGNOSIS: 1. Febrile illness/ acute viral syndrome, improved. 2. Dehydration. 3. Type 2 diabetes mellitus. 4. Hypertension. 5. Abnormal liver function tests, outpatient follow-up. 6. Thrombocytopenia, improving. 7. Hypertension. 8. Hyperlipidemia. 9. History of gastroesophageal reflux disease.    No c/o dull HA 5/10 this am, light is bothering him; no n/v   Current Medications (verified): 1)  Metoprolol Succinate 200 Mg  Tb24 (Metoprolol Succinate) .... Once Daily 2)  Glucophage 500 Mg  Tabs (Metformin Hcl) .... Take Once Daily 3)  Norvasc 5 Mg  Tabs (Amlodipine Besylate) .... Take Once Daily 4)  Omeprazole 20 Mg Cpdr (Omeprazole) .... One By Mouth Daily 5)  Vitamin D3 1000 Unit  Tabs (Cholecalciferol) .... 2 Tabs By Mouth Daily 6)  Ibuprofen 600 Mg  Tabs (Ibuprofen) .Marland Kitchen.. 1 By Mouth Two Times A Day As Needed By Mouth Pc Prn  Allergies (verified): 1)  ! Diovan 2)  ! * Caduet 3)  ! Pravachol  Family History: Reviewed history from 06/14/2009 and no changes required. Family History  Hypertension OA in the family  Social History: Reviewed history from 06/14/2009 and no changes required. Occupation: office Married Never Smoked  Review of Systems  The patient denies fever, prolonged cough, and melena.    Physical Exam  General:   tired, sitting in a dark room Eyes:  No corneal or conjunctival inflammation noted. EOMI. Perrla. Ears:  External ear exam shows no significant lesions or deformities.  Otoscopic examination reveals clear canals, tympanic membranes are intact bilaterally without bulging, retraction, inflammation or discharge. Hearing is grossly normal bilaterally. Nose:  External nasal examination shows no deformity or inflammation. Nasal mucosa are pink and moist without lesions or exudates. Mouth:  Erythematous throat mucosa and intranasal erythema.  Neck:  No deformities, masses, or tenderness noted. Lungs:  Normal respiratory effort, chest expands symmetrically. Lungs are clear to auscultation, no crackles or wheezes. Heart:  Normal rate and regular rhythm. S1 and S2 normal without gallop, murmur, click, rub or other extra sounds. Abdomen:  Bowel sounds positive,abdomen soft and non-tender without masses, organomegaly or hernias noted. Msk:  Lumbar-sacral spine is tender to palpation over paraspinal muscles and painfull with the ROM  Extremities:  No clubbing, cyanosis, edema, or deformity noted with normal full range of motion of all joints.   Neurologic:  Photophobic.No cranial nerve deficits noted.  Sensory, motor and coordinative functions appear intact. No meningeal signs. Skin:  Clear Psych:  Cognition and judgment appear intact. Alert and cooperative with normal attention span and concentration. No apparent delusions, illusions, hallucinations   Impression & Recommendations:  Problem # 1:  HEADACHE (ICD-784.0) Assessment Improved  To work 4/11if OK sick since 3/30 Given 1 Triximet po His updated medication list for this problem includes:     Metoprolol Succinate 200 Mg Tb24 (Metoprolol succinate) ..... Once daily    Ibuprofen 600 Mg Tabs (Ibuprofen) .Marland Kitchen... 1 by mouth two times a day as needed by mouth pc prn    Sumatriptan Succinate 100 Mg Tabs (Sumatriptan succinate) .Marland Kitchen... 1 by mouth once daily as needed for migraine    Hydrocodone-acetaminophen 10-325 Mg Tabs (Hydrocodone-acetaminophen) .Marland Kitchen... 1 by mouth two times a day by mouth bid  Orders: TLB-CBC Platelet - w/Differential (85025-CBCD) TLB-BMP (Basic Metabolic Panel-BMET) (80048-METABOL) TLB-Hepatic/Liver Function Pnl (80076-HEPATIC)  Problem # 2:  NAUSEA (ICD-787.02) Assessment: Improved  Problem # 3:  ARTHRALGIA (ICD-719.40) Assessment: Improved  Problem # 4:  DIABETES MELLITUS, TYPE II (ICD-250.00) Assessment: Unchanged  His updated medication list for this problem includes:    Glucophage 500 Mg Tabs (Metformin hcl) .Marland Kitchen... Take once daily  Problem # 5:  THROMBOCYTOPENIA (ICD-287.5) Assessment: Comment Only CBC is pending   Complete Medication List: 1)  Metoprolol Succinate 200 Mg Tb24 (Metoprolol succinate) .... Once daily 2)  Glucophage 500 Mg Tabs (Metformin hcl) .... Take once daily 3)  Norvasc 5 Mg Tabs (Amlodipine besylate) .... Take once daily 4)  Omeprazole 20 Mg Cpdr (Omeprazole) .... One by mouth daily 5)  Vitamin D3 1000 Unit Tabs (Cholecalciferol) .... 2 tabs by mouth daily 6)  Ibuprofen 600 Mg Tabs (Ibuprofen) .Marland Kitchen.. 1 by mouth two times a day as needed by mouth pc prn 7)  Sumatriptan Succinate 100 Mg Tabs (Sumatriptan succinate) .Marland Kitchen.. 1 by mouth once daily as needed for migraine 8)  Hydrocodone-acetaminophen 10-325 Mg Tabs (Hydrocodone-acetaminophen) .Marland Kitchen.. 1 by mouth two times a day by mouth bid 9)  Promethazine Hcl 25 Mg Tabs (Promethazine hcl) .Marland Kitchen.. 1-2 by mouth four times a day as needed nausea  Patient Instructions: 1)  Please schedule a follow-up appointment in 1 month. 2)  Call if you are not better in a reasonable amount of time or if worse.    Prescriptions: PROMETHAZINE HCL 25 MG TABS (PROMETHAZINE HCL) 1-2 by mouth four times a day as needed nausea  #60 x 1   Entered and Authorized by:   Tresa Garter MD   Signed by:   Tresa Garter MD on 02/06/2010   Method used:   Print then Give to Patient   RxID:   [id number] HYDROCODONE-ACETAMINOPHEN 10-325 MG TABS (HYDROCODONE-ACETAMINOPHEN) 1 by mouth two times a day by mouth bid  #60 x 1   Entered and Authorized by:   Tresa Garter MD   Signed by:   Tresa Garter MD on 02/06/2010   Method used:   Print then Give to Patient   RxID:   [id number] SUMATRIPTAN SUCCINATE 100 MG TABS (SUMATRIPTAN SUCCINATE) 1 by mouth once daily as needed for migraine  #6 x 12   Entered and Authorized by:   Tresa Garter MD   Signed by:   Tresa Garter MD on 02/06/2010   Method used:   Print then Give to Patient   RxID:   [id number]

## 2010-12-06 NOTE — Letter (Signed)
Summary: Primary Care Appointment Letter  [facility]  [address] [place], [place] [address]   Phone: [phone]  Fax: [phone]    11/23/2010 MRN: [id number]  Jacob Gordon [address] [place], [place]  [address]  Dear [name],   Your Primary Care Physician Tresa Garter MD has indicated that:    _______it is time to schedule an appointment.    _______you missed your appointment on______ and need to call and          reschedule.    _______you need to have lab work done.    _______you need to schedule an appointment discuss lab or test results.    ___X____you need to call to reschedule your appointment that is                       scheduled on Feb. 17, 2012 with [name]. Please call the office.     Please call our office as soon as possible. Our phone number is [phone]. Please press option 1. Our office is open 8a-12noon and 1p-5p, Monday through Friday.     Thank you,    [facility]uler

## 2010-12-14 ENCOUNTER — Other Ambulatory Visit: Payer: Self-pay

## 2010-12-18 ENCOUNTER — Encounter (INDEPENDENT_AMBULATORY_CARE_PROVIDER_SITE_OTHER): Payer: Self-pay | Admitting: *Deleted

## 2010-12-18 ENCOUNTER — Other Ambulatory Visit: Payer: Medicare HMO

## 2010-12-18 ENCOUNTER — Other Ambulatory Visit: Payer: Self-pay | Admitting: Internal Medicine

## 2010-12-18 ENCOUNTER — Encounter: Payer: Self-pay | Admitting: Internal Medicine

## 2010-12-18 DIAGNOSIS — E785 Hyperlipidemia, unspecified: Secondary | ICD-10-CM

## 2010-12-18 DIAGNOSIS — Z0000 Encounter for general adult medical examination without abnormal findings: Secondary | ICD-10-CM

## 2010-12-18 DIAGNOSIS — Z125 Encounter for screening for malignant neoplasm of prostate: Secondary | ICD-10-CM

## 2010-12-18 DIAGNOSIS — E119 Type 2 diabetes mellitus without complications: Secondary | ICD-10-CM

## 2010-12-18 LAB — BASIC METABOLIC PANEL
Calcium: 9.4 mg/dL (ref 8.4–10.5)
Chloride: 105 mEq/L (ref 96–112)
GFR: 85.19 mL/min (ref >60.00)

## 2010-12-18 LAB — LDL CHOLESTEROL, DIRECT: Direct LDL: 188.6 mg/dL

## 2010-12-18 LAB — CBC WITH DIFFERENTIAL/PLATELET
Basophils Absolute: 0 10*3/uL (ref 0.0–0.1)
Basophils Relative: 0.4 % (ref 0.0–3.0)
Eosinophils Absolute: 0.1 10*3/uL (ref 0.0–0.7)
HCT: 47.9 % (ref 39.0–52.0)
Lymphs Abs: 1.4 10*3/uL (ref 0.7–4.0)
MCV: 96 fl (ref 78.0–100.0)
Monocytes Absolute: 0.5 10*3/uL (ref 0.1–1.0)
Neutro Abs: 3.2 10*3/uL (ref 1.4–7.7)
Platelets: 123 10*3/uL — ABNORMAL LOW (ref 150.0–400.0)
RBC: 4.99 Mil/uL (ref 4.22–5.81)
WBC: 5.2 10*3/uL (ref 4.5–10.5)

## 2010-12-18 LAB — HEPATIC FUNCTION PANEL: Total Bilirubin: 1.4 mg/dL — ABNORMAL HIGH (ref 0.3–1.2)

## 2010-12-18 LAB — HEMOGLOBIN A1C: Hgb A1c MFr Bld: 6.4 % (ref 4.6–6.5)

## 2010-12-18 LAB — URINALYSIS
Ketones, ur: NEGATIVE
Specific Gravity, Urine: 1.015 (ref 1.000–1.030)
Urine Glucose: NEGATIVE
pH: 7 (ref 5.0–8.0)

## 2010-12-18 LAB — LIPID PANEL
Cholesterol: 244 mg/dL — ABNORMAL HIGH (ref 0–200)
Total CHOL/HDL Ratio: 5
Triglycerides: 60 mg/dL (ref 0.0–149.0)
VLDL: 12 mg/dL (ref 0.0–40.0)

## 2011-01-23 LAB — CBC
HCT: 45.4 % (ref 39.0–52.0)
HCT: 49.3 % (ref 39.0–52.0)
Hemoglobin: 15.2 g/dL (ref 13.0–17.0)
Hemoglobin: 16.2 g/dL (ref 13.0–17.0)
MCHC: 33 g/dL (ref 30.0–36.0)
MCHC: 33.5 g/dL (ref 30.0–36.0)
MCV: 96.7 fL (ref 78.0–100.0)
MCV: 96.9 fL (ref 78.0–100.0)
Platelets: 73 K/uL — ABNORMAL LOW (ref 150–400)
RBC: 4.7 MIL/uL (ref 4.22–5.81)
RBC: 5.08 MIL/uL (ref 4.22–5.81)
RDW: 15.1 % (ref 11.5–15.5)
RDW: 15.4 % (ref 11.5–15.5)
WBC: 5.6 K/uL (ref 4.0–10.5)

## 2011-01-23 LAB — COMPREHENSIVE METABOLIC PANEL
ALT: 103 U/L — ABNORMAL HIGH (ref 0–53)
Albumin: 3.2 g/dL — ABNORMAL LOW (ref 3.5–5.2)
BUN: 10 mg/dL (ref 6–23)
BUN: 16 mg/dL (ref 6–23)
CO2: 26 mEq/L (ref 19–32)
CO2: 28 mEq/L (ref 19–32)
Calcium: 9.7 mg/dL (ref 8.4–10.5)
Chloride: 105 mEq/L (ref 96–112)
Creatinine, Ser: 1.06 mg/dL (ref 0.4–1.5)
Creatinine, Ser: 1.32 mg/dL (ref 0.4–1.5)
GFR calc Af Amer: >60 mL/min (ref >60)
GFR calc non Af Amer: 57 mL/min — ABNORMAL LOW (ref >60)
GFR calc non Af Amer: >60 mL/min (ref >60)
Glucose, Bld: 117 mg/dL — ABNORMAL HIGH (ref 70–99)
Glucose, Bld: 126 mg/dL — ABNORMAL HIGH (ref 70–99)
Potassium: 3.6 mEq/L (ref 3.5–5.1)
Sodium: 141 mEq/L (ref 135–145)
Total Bilirubin: 1.1 mg/dL (ref 0.3–1.2)
Total Protein: 5.9 g/dL — ABNORMAL LOW (ref 6.0–8.3)

## 2011-01-23 LAB — URINALYSIS, ROUTINE W REFLEX MICROSCOPIC
Ketones, ur: NEGATIVE mg/dL
Nitrite: NEGATIVE
Protein, ur: NEGATIVE mg/dL
pH: 8 (ref 5.0–8.0)

## 2011-01-23 LAB — GLUCOSE, CAPILLARY: Glucose-Capillary: 117 mg/dL — ABNORMAL HIGH (ref 70–99)

## 2011-01-23 LAB — DIFFERENTIAL
Basophils Absolute: 0 10*3/uL (ref 0.0–0.1)
Basophils Relative: 0 % (ref 0–1)
Eosinophils Absolute: 0.1 10*3/uL (ref 0.0–0.7)
Eosinophils Absolute: 0.1 10*3/uL (ref 0.0–0.7)
Lymphs Abs: 2.3 10*3/uL (ref 0.7–4.0)
Neutro Abs: 2.7 10*3/uL (ref 1.7–7.7)
Neutro Abs: 8.4 10*3/uL — ABNORMAL HIGH (ref 1.7–7.7)
Neutrophils Relative %: 49 % (ref 43–77)
Neutrophils Relative %: 73 % (ref 43–77)

## 2011-01-23 LAB — LIPASE, BLOOD: Lipase: 32 U/L (ref 11–59)

## 2011-01-28 LAB — COMPREHENSIVE METABOLIC PANEL
ALT: 65 U/L — ABNORMAL HIGH (ref 0–53)
ALT: 66 U/L — ABNORMAL HIGH (ref 0–53)
AST: 63 U/L — ABNORMAL HIGH (ref 0–37)
AST: 68 U/L — ABNORMAL HIGH (ref 0–37)
Albumin: 3.3 g/dL — ABNORMAL LOW (ref 3.5–5.2)
Albumin: 3.8 g/dL (ref 3.5–5.2)
Alkaline Phosphatase: 68 U/L (ref 39–117)
Alkaline Phosphatase: 80 U/L (ref 39–117)
GFR calc Af Amer: >60 mL/min (ref >60)
Potassium: 3.7 mEq/L (ref 3.5–5.1)
Potassium: 3.8 mEq/L (ref 3.5–5.1)
Sodium: 135 mEq/L (ref 135–145)
Sodium: 136 mEq/L (ref 135–145)
Total Protein: 6 g/dL (ref 6.0–8.3)
Total Protein: 6.7 g/dL (ref 6.0–8.3)

## 2011-01-28 LAB — GLUCOSE, CAPILLARY
Glucose-Capillary: 143 mg/dL — ABNORMAL HIGH (ref 70–99)
Glucose-Capillary: 166 mg/dL — ABNORMAL HIGH (ref 70–99)

## 2011-01-28 LAB — CK TOTAL AND CKMB (NOT AT ARMC): CK, MB: 1 ng/mL (ref 0.3–4.0)

## 2011-01-28 LAB — PARVOVIRUS B19 ANTIBODY, IGG AND IGM: Parovirus B19 IgM Abs: <0.9 Index (ref <0.9)

## 2011-01-28 LAB — CSF CELL COUNT WITH DIFFERENTIAL
RBC Count, CSF: 3 /mm3 — ABNORMAL HIGH
Tube #: 4
WBC, CSF: 0 /mm3 (ref 0–5)

## 2011-01-28 LAB — CARDIAC PANEL(CRET KIN+CKTOT+MB+TROPI)
CK, MB: 0.8 ng/mL (ref 0.3–4.0)
Relative Index: 0.4 (ref 0.0–2.5)
Relative Index: 0.5 (ref 0.0–2.5)
Total CK: 211 U/L (ref 7–232)
Troponin I: 0.03 ng/mL (ref 0.00–0.06)

## 2011-01-28 LAB — DIFFERENTIAL
Basophils Relative: 0 % (ref 0–1)
Basophils Relative: 0 % (ref 0–1)
Eosinophils Absolute: 0 10*3/uL (ref 0.0–0.7)
Eosinophils Absolute: 0 10*3/uL (ref 0.0–0.7)
Eosinophils Relative: 0 % (ref 0–5)
Monocytes Absolute: 0.3 10*3/uL (ref 0.1–1.0)
Monocytes Absolute: 0.5 10*3/uL (ref 0.1–1.0)
Monocytes Relative: 11 % (ref 3–12)
Monocytes Relative: 7 % (ref 3–12)

## 2011-01-28 LAB — CMV ANTIBODY, IGG (EIA): CMV Ab - IgG: <0.2 IU/mL (ref <0.4)

## 2011-01-28 LAB — CULTURE, BLOOD (ROUTINE X 2): Culture: NO GROWTH

## 2011-01-28 LAB — HSV PCR: HSV 2 , PCR: NOT DETECTED

## 2011-01-28 LAB — CSF CULTURE W GRAM STAIN: Culture: NO GROWTH

## 2011-01-28 LAB — RAPID URINE DRUG SCREEN, HOSP PERFORMED
Amphetamines: NOT DETECTED
Cocaine: NOT DETECTED
Opiates: POSITIVE — AB
Tetrahydrocannabinol: NOT DETECTED

## 2011-01-28 LAB — URINALYSIS, ROUTINE W REFLEX MICROSCOPIC
Nitrite: NEGATIVE
pH: 5.5 (ref 5.0–8.0)

## 2011-01-28 LAB — RPR: RPR Ser Ql: NONREACTIVE

## 2011-01-28 LAB — CBC
Hemoglobin: 15.6 g/dL (ref 13.0–17.0)
Hemoglobin: 16.8 g/dL (ref 13.0–17.0)
Platelets: 65 10*3/uL — ABNORMAL LOW (ref 150–400)
Platelets: 72 10*3/uL — ABNORMAL LOW (ref 150–400)
RDW: 14.6 % (ref 11.5–15.5)
RDW: 14.8 % (ref 11.5–15.5)

## 2011-01-28 LAB — T3, FREE: T3, Free: 2.2 pg/mL — ABNORMAL LOW (ref 2.3–4.2)

## 2011-01-28 LAB — ENTEROVIRUS PCR: Enterovirus PCR: NOT DETECTED

## 2011-01-28 LAB — HIV-1 RNA ULTRAQUANT REFLEX TO GENTYP+
HIV 1 RNA Quant: <48 copies/mL (ref <48)
HIV-1 RNA Quant, Log: <1.68 {Log} (ref <1.68)

## 2011-01-28 LAB — HEPATITIS PANEL, ACUTE
HCV Ab: NEGATIVE
Hepatitis B Surface Ag: NEGATIVE

## 2011-01-28 LAB — EPSTEIN-BARR VIRUS VCA ANTIBODY PANEL: EBV VCA IgM: 0.35 {ISR}

## 2011-01-28 LAB — URINE MICROSCOPIC-ADD ON

## 2011-01-28 LAB — TROPONIN I: Troponin I: 0.01 ng/mL (ref 0.00–0.06)

## 2011-01-28 LAB — URINE CULTURE
Colony Count: NO GROWTH
Culture: NO GROWTH

## 2011-01-28 LAB — ANA: Anti Nuclear Antibody(ANA): NEGATIVE

## 2011-01-28 LAB — PROTEIN AND GLUCOSE, CSF: Total  Protein, CSF: 71 mg/dL — ABNORMAL HIGH (ref 15–45)

## 2011-02-20 ENCOUNTER — Encounter: Payer: Self-pay | Admitting: Internal Medicine

## 2011-02-26 ENCOUNTER — Encounter: Payer: Medicare HMO | Admitting: Internal Medicine

## 2011-04-28 ENCOUNTER — Other Ambulatory Visit: Payer: Self-pay | Admitting: Internal Medicine

## 2011-07-03 ENCOUNTER — Ambulatory Visit: Payer: Medicare HMO

## 2011-07-03 DIAGNOSIS — Z1289 Encounter for screening for malignant neoplasm of other sites: Secondary | ICD-10-CM

## 2011-07-03 DIAGNOSIS — Z0000 Encounter for general adult medical examination without abnormal findings: Secondary | ICD-10-CM

## 2011-07-03 LAB — HEPATIC FUNCTION PANEL
AST: 26 U/L (ref 0–37)
Albumin: 4.1 g/dL (ref 3.5–5.2)
Alkaline Phosphatase: 81 U/L (ref 39–117)
Bilirubin, Direct: 0.2 mg/dL (ref 0.0–0.3)
Total Protein: 6.8 g/dL (ref 6.0–8.3)

## 2011-07-03 LAB — CBC WITH DIFFERENTIAL/PLATELET
Basophils Relative: 0.2 % (ref 0.0–3.0)
Eosinophils Relative: 1.1 % (ref 0.0–5.0)
HCT: 48.4 % (ref 39.0–52.0)
Hemoglobin: 16 g/dL (ref 13.0–17.0)
Lymphocytes Relative: 27.3 % (ref 12.0–46.0)
Lymphs Abs: 1.8 10*3/uL (ref 0.7–4.0)
Monocytes Relative: 9.7 % (ref 3.0–12.0)
Neutro Abs: 4.1 10*3/uL (ref 1.4–7.7)
RBC: 5.01 Mil/uL (ref 4.22–5.81)
WBC: 6.6 10*3/uL (ref 4.5–10.5)

## 2011-07-03 LAB — URINALYSIS
Bilirubin Urine: NEGATIVE
Ketones, ur: NEGATIVE
Leukocytes, UA: NEGATIVE
Specific Gravity, Urine: 1.02 (ref 1.000–1.030)
Urine Glucose: NEGATIVE
Urobilinogen, UA: 0.2 (ref 0.0–1.0)

## 2011-07-03 LAB — BASIC METABOLIC PANEL
CO2: 29 mEq/L (ref 19–32)
Calcium: 9.3 mg/dL (ref 8.4–10.5)
GFR: 75.82 mL/min (ref >60.00)
Glucose, Bld: 104 mg/dL — ABNORMAL HIGH (ref 70–99)
Potassium: 4.1 mEq/L (ref 3.5–5.1)
Sodium: 143 mEq/L (ref 135–145)

## 2011-07-03 LAB — LIPID PANEL
HDL: 48 mg/dL (ref >39.00)
Total CHOL/HDL Ratio: 6
Triglycerides: 82 mg/dL (ref 0.0–149.0)

## 2011-07-03 LAB — LDL CHOLESTEROL, DIRECT: Direct LDL: 207.7 mg/dL

## 2011-07-10 ENCOUNTER — Ambulatory Visit (INDEPENDENT_AMBULATORY_CARE_PROVIDER_SITE_OTHER): Payer: Medicare HMO | Admitting: Internal Medicine

## 2011-07-10 ENCOUNTER — Encounter: Payer: Self-pay | Admitting: Internal Medicine

## 2011-07-10 VITALS — BP 134/98 | HR 84 | Temp 98.5°F | Resp 16 | Ht 69.0 in | Wt 197.0 lb

## 2011-07-10 DIAGNOSIS — E119 Type 2 diabetes mellitus without complications: Secondary | ICD-10-CM

## 2011-07-10 DIAGNOSIS — D696 Thrombocytopenia, unspecified: Secondary | ICD-10-CM

## 2011-07-10 DIAGNOSIS — N400 Enlarged prostate without lower urinary tract symptoms: Secondary | ICD-10-CM

## 2011-07-10 DIAGNOSIS — Z0000 Encounter for general adult medical examination without abnormal findings: Secondary | ICD-10-CM

## 2011-07-10 DIAGNOSIS — I10 Essential (primary) hypertension: Secondary | ICD-10-CM

## 2011-07-10 MED ORDER — ESOMEPRAZOLE MAGNESIUM 40 MG PO CPDR: 40.0000 mg | DELAYED_RELEASE_CAPSULE | Freq: Two times a day (BID) | ORAL | Status: DC

## 2011-07-10 MED ORDER — AMLODIPINE BESYLATE 10 MG PO TABS: 10.0000 mg | ORAL_TABLET | Freq: Every day | ORAL | Status: DC

## 2011-07-10 MED ORDER — METOPROLOL SUCCINATE ER 200 MG PO TB24: 200.0000 mg | ORAL_TABLET | Freq: Every day | ORAL | Status: DC

## 2011-07-10 MED ORDER — METFORMIN HCL 500 MG PO TABS: 500.0000 mg | ORAL_TABLET | Freq: Every day | ORAL | Status: DC

## 2011-07-10 MED ORDER — ROSUVASTATIN CALCIUM 5 MG PO TABS: 5.0000 mg | ORAL_TABLET | Freq: Every day | ORAL | Status: DC

## 2011-07-10 MED ORDER — FINASTERIDE 5 MG PO TABS: 5.0000 mg | ORAL_TABLET | Freq: Every day | ORAL | Status: DC

## 2011-07-10 MED ORDER — ERGOCALCIFEROL 1.25 MG (50000 UT) PO CAPS: 50000.0000 [IU] | ORAL_CAPSULE | ORAL | Status: DC

## 2011-07-10 NOTE — Patient Instructions (Signed)
Try Crestor 1 tab of 5 mg twice a week

## 2011-07-10 NOTE — Assessment & Plan Note (Signed)
Continue with current prescription therapy as reflected on the Med list.  

## 2011-07-10 NOTE — Assessment & Plan Note (Signed)
Start Proscar. 

## 2011-07-10 NOTE — Assessment & Plan Note (Signed)
We discussed age appropriate health related issues, including available/recomended screening tests and vaccinations. We discussed a need for adhering to healthy diet and exercise. Labs/EKG were reviewed/ordered. All questions were answered.   

## 2011-07-10 NOTE — Progress Notes (Signed)
Subjective:    Patient ID: Jacob Gordon, male    DOB: [birth year], 55 y.o.   MRN: [id number]  HPI  The patient is here for a wellness exam. The patient has been doing well overall without major physical or psychological issues going on lately. The patient needs to address  chronic hypertension that has been well controlled with medicines; to address chronic  hyperlipidemia controlled with medicines as well; and to address type 2 chronic diabetes, controlled with medical treatment and diet. C/o problem emptying the bladder, urgency, frequency  Review of Systems  Constitutional: Negative for appetite change, fatigue and unexpected weight change.  HENT: Negative for nosebleeds, congestion, sore throat, sneezing, trouble swallowing and neck pain.   Eyes: Negative for itching and visual disturbance.  Respiratory: Negative for cough.   Cardiovascular: Negative for chest pain, palpitations and leg swelling.  Gastrointestinal: Negative for nausea, diarrhea, blood in stool and abdominal distention.  Genitourinary: Positive for urgency, decreased urine volume and difficulty urinating. Negative for frequency and hematuria.  Musculoskeletal: Negative for back pain, joint swelling and gait problem.  Skin: Negative for rash.  Neurological: Negative for dizziness, tremors, speech difficulty and weakness.  Psychiatric/Behavioral: Negative for sleep disturbance, dysphoric mood and agitation. The patient is not nervous/anxious.        Objective:   Physical Exam  Constitutional: He is oriented to person, place, and time. He appears well-developed and well-nourished. No distress.  HENT:  Head: Normocephalic and atraumatic.  Right Ear: External ear normal.  Left Ear: External ear normal.  Nose: Nose normal.  Mouth/Throat: Oropharynx is clear and moist. No oropharyngeal exudate.  Eyes: Conjunctivae and EOM are normal. Pupils are equal, round, and reactive to light. Right eye exhibits no discharge.  Left eye exhibits no discharge. No scleral icterus.  Neck: Normal range of motion. Neck supple. No JVD present. No tracheal deviation present. No thyromegaly present.  Cardiovascular: Normal rate, regular rhythm, normal heart sounds and intact distal pulses.  Exam reveals no gallop and no friction rub.   No murmur heard. Pulmonary/Chest: Effort normal and breath sounds normal. No stridor. No respiratory distress. He has no wheezes. He has no rales. He exhibits no tenderness.  Abdominal: Soft. Bowel sounds are normal. He exhibits no distension and no mass. There is no tenderness. There is no rebound and no guarding.  Genitourinary: Rectum normal and penis normal. Guaiac negative stool. No penile tenderness.  Musculoskeletal: Normal range of motion. He exhibits no edema and no tenderness.  Lymphadenopathy:    He has no cervical adenopathy.  Neurological: He is alert and oriented to person, place, and time. He has normal reflexes. No cranial nerve deficit. He exhibits normal muscle tone. Coordination normal.  Skin: Skin is warm and dry. No rash noted. He is not diaphoretic. No erythema. No pallor.  Psychiatric: He has a normal mood and affect. His behavior is normal. Judgment and thought content normal.     Lab Results  Component Value Date   WBC 6.6 07/03/2011   HGB 16.0 07/03/2011   HCT 48.4 07/03/2011   PLT 134.0* 07/03/2011   CHOL 271* 07/03/2011   TRIG 82.0 07/03/2011   HDL 48.00 07/03/2011   LDLDIRECT 207.7 07/03/2011   ALT 29 07/03/2011   AST 26 07/03/2011   NA 143 07/03/2011   K 4.1 07/03/2011   CL 105 07/03/2011   CREATININE 1.3 07/03/2011   BUN 13 07/03/2011   CO2 29 07/03/2011   TSH 1.09 07/03/2011   PSA 0.63 07/03/2011  HGBA1C 6.4 12/18/2010   MICROALBUR 0.7 11/21/2008        Assessment & Plan:

## 2011-07-14 ENCOUNTER — Encounter: Payer: Self-pay | Admitting: Internal Medicine

## 2011-07-14 NOTE — Assessment & Plan Note (Signed)
Better  

## 2011-09-05 ENCOUNTER — Telehealth: Payer: Self-pay | Admitting: *Deleted

## 2011-09-05 ENCOUNTER — Encounter: Payer: Self-pay | Admitting: Internal Medicine

## 2011-09-05 NOTE — Telephone Encounter (Signed)
Pt's wife left message stating that pt has appt with Dr Leone Payor for a colonoscopy. She states they are not familiar with this doctor and wants to know if there is someone that you recommend or should he keep appt with Dr Leone Payor?

## 2011-09-06 NOTE — Telephone Encounter (Signed)
Dr Leone Payor is super - I would keep appt w/him Thx

## 2011-09-06 NOTE — Telephone Encounter (Signed)
Left message on pt's cell voicemail re: instructions below and to call if any questions. Home number was disconnected.

## 2011-09-17 ENCOUNTER — Ambulatory Visit (AMBULATORY_SURGERY_CENTER): Payer: Medicare HMO

## 2011-09-17 VITALS — Ht 69.0 in | Wt 195.2 lb

## 2011-09-17 DIAGNOSIS — Z86010 Personal history of colonic polyps: Secondary | ICD-10-CM

## 2011-09-17 DIAGNOSIS — Z1211 Encounter for screening for malignant neoplasm of colon: Secondary | ICD-10-CM

## 2011-09-17 MED ORDER — PEG-KCL-NACL-NASULF-NA ASC-C 100 G PO SOLR: 1.0000 | Freq: Once | ORAL | Status: AC

## 2011-09-17 NOTE — Progress Notes (Signed)
Pt came into the office today for his pre-visit prior to the colonoscopy on 10/01/11 with Dr Leone Payor. Pt states he is allergic to and has severe vomiting with anesthesia/ sedation  but wasn't sure what the medication was. He had no problems with last colonoscopy in 2007. Ulis Rias RN

## 2011-10-01 ENCOUNTER — Ambulatory Visit (AMBULATORY_SURGERY_CENTER): Payer: Medicare HMO | Admitting: Internal Medicine

## 2011-10-01 ENCOUNTER — Encounter: Payer: Self-pay | Admitting: Internal Medicine

## 2011-10-01 VITALS — BP 126/73 | HR 70 | Temp 96.6°F | Resp 15 | Ht 69.0 in | Wt 195.0 lb

## 2011-10-01 DIAGNOSIS — K648 Other hemorrhoids: Secondary | ICD-10-CM

## 2011-10-01 DIAGNOSIS — Z86010 Personal history of colonic polyps: Secondary | ICD-10-CM

## 2011-10-01 DIAGNOSIS — Z1211 Encounter for screening for malignant neoplasm of colon: Secondary | ICD-10-CM

## 2011-10-01 HISTORY — PX: COLONOSCOPY: SHX174

## 2011-10-01 LAB — GLUCOSE, CAPILLARY: Glucose-Capillary: 115 mg/dL — ABNORMAL HIGH (ref 70–99)

## 2011-10-01 MED ORDER — SODIUM CHLORIDE 0.9 % IV SOLN: 500.0000 mL | INTRAVENOUS | Status: DC

## 2011-10-01 NOTE — Progress Notes (Signed)
Pt tolerated the colonoscopy very well. maw 

## 2011-10-01 NOTE — Patient Instructions (Signed)
Internal hemorrhoids were seen but the colon was otherwise normal. Next routine colonoscopy in about 10 years (2022). Iva Boop, MD, [facility]

## 2011-10-01 NOTE — Progress Notes (Signed)
Patient did not experience any of the following events: a burn prior to discharge; a fall within the facility; wrong site/side/patient/procedure/implant event; or a hospital transfer or hospital admission upon discharge from the facility. (G8907) Patient did not have preoperative order for IV antibiotic SSI prophylaxis. (G8918)  

## 2011-10-02 ENCOUNTER — Telehealth: Payer: Self-pay | Admitting: *Deleted

## 2011-10-02 MED ORDER — METOCLOPRAMIDE HCL 10 MG PO TABS: 10.0000 mg | ORAL_TABLET | Freq: Three times a day (TID) | ORAL | Status: DC | PRN

## 2011-10-02 NOTE — Telephone Encounter (Signed)
Addended by: Stan Head E on: 10/02/2011 09:30 AM   Modules accepted: Orders

## 2011-10-02 NOTE — Telephone Encounter (Signed)
He should sip clear liquids and gradually increase I have sent a metaclopramide prescription to his pharmacy to take before meals as needed. This will help nausea and stomach emptying. He should stop it when back to normal. Should be better later today or tomorrow. If not needs to call us back.

## 2011-10-02 NOTE — Telephone Encounter (Signed)
Notified wife of Dr. Marvell Fuller instructions. She thanked [place] for our help.

## 2011-10-02 NOTE — Telephone Encounter (Signed)
Follow up Call- Patient questions:  Do you have a fever, pain , or abdominal swelling? no Pain Score  0 *  Have you tolerated food without any problems? no  Have you been able to return to your normal activities? no  Do you have any questions about your discharge instructions: Diet   no Medications  no Follow up visit  no  Do you have questions or concerns about your Care? no  Actions: * If pain score is 4 or above: No action needed, pain <4.     SPOKE WITH PATIENT'S WIFE. SHE STATES THAT HE HAS HAD "TERRIBLE" BELCHING AND NAUSEA, BUT NO VOMITING. PATIENT IS A DIABETIC, HAS NOT BEEN ABLE TO EAT, AND HAS A BAD HEADACHE. NO FEVER. NO OTHER SYMPTOMS. RECOMMENDED GAS X TO HELP WITH GAS, TYLENOL OR MOTRIN FOR HEADACHE. SHE ASKED IF WE COULD CALL A PRESCRIPTION IN FOR HIS NAUSEA.  I TOLD HER I WOULD TALK TO YOU AND SEE WHAT MD ADVISES.

## 2011-10-03 ENCOUNTER — Other Ambulatory Visit: Payer: Self-pay | Admitting: *Deleted

## 2011-10-03 ENCOUNTER — Ambulatory Visit (INDEPENDENT_AMBULATORY_CARE_PROVIDER_SITE_OTHER): Payer: Medicare HMO | Admitting: Internal Medicine

## 2011-10-03 ENCOUNTER — Encounter: Payer: Self-pay | Admitting: Internal Medicine

## 2011-10-03 ENCOUNTER — Encounter: Payer: Self-pay | Admitting: *Deleted

## 2011-10-03 VITALS — BP 130/92 | HR 101 | Temp 100.0°F

## 2011-10-03 DIAGNOSIS — E860 Dehydration: Secondary | ICD-10-CM

## 2011-10-03 DIAGNOSIS — I10 Essential (primary) hypertension: Secondary | ICD-10-CM

## 2011-10-03 DIAGNOSIS — J111 Influenza due to unidentified influenza virus with other respiratory manifestations: Secondary | ICD-10-CM

## 2011-10-03 MED ORDER — PROMETHAZINE-CODEINE 6.25-10 MG/5ML PO SYRP: 5.0000 mL | ORAL_SOLUTION | ORAL | Status: AC | PRN

## 2011-10-03 MED ORDER — OSELTAMIVIR PHOSPHATE 75 MG PO CAPS: 75.0000 mg | ORAL_CAPSULE | Freq: Two times a day (BID) | ORAL | Status: AC

## 2011-10-03 MED ORDER — ROSUVASTATIN CALCIUM 5 MG PO TABS: 5.0000 mg | ORAL_TABLET | Freq: Every day | ORAL | Status: DC

## 2011-10-03 NOTE — Assessment & Plan Note (Signed)
BP Readings from Last 3 Encounters:  10/03/11 130/92  10/01/11 126/73  07/10/11 134/98   Reminded to avoid decongestants due to the possible effects on blood pressure elevation

## 2011-10-03 NOTE — Patient Instructions (Addendum)
It was good to see you today. Tamiflu prescribed for presumed flu symptoms Also prescription cough syrup with codeine - Your prescription(s) have been submitted to your pharmacy. Please take as directed and contact our office if you believe you are having problem(s) with the medication(s). Use Tylenol or Advil for aches, fever and plenty of hydration with rest as discussed Work note provided starting Tuesday through the rest of this week Blood pressure today is okay but avoid decongestants or stimulants that may raise his blood pressure

## 2011-10-03 NOTE — Progress Notes (Signed)
  Subjective:    HPI  complains of cold symptoms  Onset 2 days ago, progressive symptoms  associated with clear rhinorrhea, sneezing, sore throat, mild headache and fever Also mod-severe myalgias, sinus pressure and mild-mod chest congestion No relief with OTC meds Precipitated by sick contacts  Past Medical History  Diagnosis Date  . DIABETES MELLITUS, TYPE II   . THROMBOCYTOPENIA   . HYPERTENSION   . ALLERGIC RHINITIS   . GERD   . OSTEOARTHRITIS   . DEGENERATIVE DISC DISEASE   . TONSILLECTOMY AND ADENOIDECTOMY, HX OF     Review of Systems Constitutional: No night sweats, no unexpected weight change Pulmonary: No pleurisy or hemoptysis Cardiovascular: No chest pain or palpitations     Objective:   Physical Exam BP 130/92  Pulse 101  Temp(Src) 100 F (37.8 C) (Oral)  SpO2 97% GEN: fatigued and ill appearing - wife at side HENT: NCAT, mild sinus tenderness bilaterally, nares with clear discharge, oropharynx mod erythema, no exudate Eyes: Vision grossly intact, no conjunctivitis Lungs: Clear to auscultation without rhonchi or wheeze, no increased work of breathing Cardiovascular: Regular rate -slight tachy- and rhythm, no bilateral edema Neuro: AAxOx3 - MAE, cognition intact     Assessment & Plan:  Viral syndrome>> suspect influenza HTN - see below Mild dehydration due to fever and recent colonoscopy prep (48h ago)   Explained lack of efficacy for antibiotics in viral disease Empiric Tamiflu prescribed due to strong suspicion of flu Prescription cough suppression - new prescriptions done Symptomatic care with Tylenol or Advil, hydration and rest -  salt gargle advised as needed Work note provided: Tues due to colonoscopy through the remainder of this week

## 2011-11-13 ENCOUNTER — Other Ambulatory Visit: Payer: Self-pay | Admitting: Internal Medicine

## 2011-11-13 ENCOUNTER — Encounter: Payer: Self-pay | Admitting: Internal Medicine

## 2011-11-13 ENCOUNTER — Ambulatory Visit (INDEPENDENT_AMBULATORY_CARE_PROVIDER_SITE_OTHER): Payer: Medicare HMO | Admitting: Internal Medicine

## 2011-11-13 ENCOUNTER — Other Ambulatory Visit (INDEPENDENT_AMBULATORY_CARE_PROVIDER_SITE_OTHER): Payer: Medicare HMO

## 2011-11-13 VITALS — BP 114/88 | HR 80 | Temp 99.1°F | Resp 16 | Wt 196.0 lb

## 2011-11-13 DIAGNOSIS — Z23 Encounter for immunization: Secondary | ICD-10-CM

## 2011-11-13 DIAGNOSIS — I10 Essential (primary) hypertension: Secondary | ICD-10-CM

## 2011-11-13 DIAGNOSIS — E119 Type 2 diabetes mellitus without complications: Secondary | ICD-10-CM

## 2011-11-13 DIAGNOSIS — R1310 Dysphagia, unspecified: Secondary | ICD-10-CM

## 2011-11-13 DIAGNOSIS — R110 Nausea: Secondary | ICD-10-CM

## 2011-11-13 DIAGNOSIS — R1319 Other dysphagia: Secondary | ICD-10-CM | POA: Insufficient documentation

## 2011-11-13 DIAGNOSIS — K219 Gastro-esophageal reflux disease without esophagitis: Secondary | ICD-10-CM

## 2011-11-13 LAB — BASIC METABOLIC PANEL
BUN: 17 mg/dL (ref 6–23)
Calcium: 9.2 mg/dL (ref 8.4–10.5)
Chloride: 106 mEq/L (ref 96–112)
Creatinine, Ser: 1.2 mg/dL (ref 0.4–1.5)

## 2011-11-13 LAB — CBC WITH DIFFERENTIAL/PLATELET
Basophils Relative: 0.3 % (ref 0.0–3.0)
Eosinophils Absolute: 0.1 10*3/uL (ref 0.0–0.7)
HCT: 46.7 % (ref 39.0–52.0)
Hemoglobin: 15.8 g/dL (ref 13.0–17.0)
Lymphocytes Relative: 24.4 % (ref 12.0–46.0)
Lymphs Abs: 1.5 10*3/uL (ref 0.7–4.0)
MCHC: 33.9 g/dL (ref 30.0–36.0)
MCV: 95.6 fl (ref 78.0–100.0)
Monocytes Absolute: 0.6 10*3/uL (ref 0.1–1.0)
Neutro Abs: 4.1 10*3/uL (ref 1.4–7.7)
RBC: 4.88 Mil/uL (ref 4.22–5.81)

## 2011-11-13 LAB — HEPATIC FUNCTION PANEL
ALT: 40 U/L (ref 0–53)
Total Bilirubin: 1.1 mg/dL (ref 0.3–1.2)

## 2011-11-13 LAB — H. PYLORI ANTIBODY, IGG: H Pylori IgG: NEGATIVE

## 2011-11-13 LAB — LIPID PANEL: Cholesterol: 238 mg/dL — ABNORMAL HIGH (ref 0–200)

## 2011-11-13 LAB — HEMOGLOBIN A1C: Hgb A1c MFr Bld: 6.5 % (ref 4.6–6.5)

## 2011-11-13 LAB — LIPASE: Lipase: 38 U/L (ref 11.0–59.0)

## 2011-11-13 NOTE — Assessment & Plan Note (Signed)
Continue with current prescription therapy as reflected on the Med list.  

## 2011-11-13 NOTE — Patient Instructions (Signed)
Stop Crestor for now

## 2011-11-13 NOTE — Assessment & Plan Note (Signed)
D/c Crestor Will poss need EGD

## 2011-11-13 NOTE — Assessment & Plan Note (Signed)
Cont Nexium BID Stop Crestor

## 2011-11-13 NOTE — Assessment & Plan Note (Signed)
GI consult Cont Nexium

## 2011-11-13 NOTE — Progress Notes (Signed)
  Subjective:    Patient ID: Jacob Gordon, male    DOB: 08/03/1956, 56 y.o.   MRN: [id number]  HPI  C/o nausea every day; food is making it better, then nausea comes in a couple hrs. No night sx's. C/o diarrhea 2d/wk. Taking a lot of indigestion. TUMs help.Marland KitchenMarland KitchenCrestor seems to bother - got better when he stopped it.  Review of Systems  Constitutional: Negative for appetite change, fatigue and unexpected weight change.  HENT: Positive for trouble swallowing. Negative for nosebleeds, congestion, sore throat, sneezing and neck pain.   Eyes: Negative for itching and visual disturbance.  Respiratory: Negative for cough.   Cardiovascular: Negative for chest pain, palpitations and leg swelling.  Gastrointestinal: Positive for nausea and abdominal distention. Negative for vomiting, diarrhea and blood in stool.       ?dysphagia  Genitourinary: Negative for frequency and hematuria.  Musculoskeletal: Negative for back pain, joint swelling and gait problem.  Skin: Negative for rash.  Neurological: Negative for dizziness, tremors, speech difficulty and weakness.  Psychiatric/Behavioral: Negative for suicidal ideas, sleep disturbance, dysphoric mood and agitation. The patient is not nervous/anxious.        Objective:   Physical Exam  Constitutional: He is oriented to person, place, and time. He appears well-developed.  HENT:  Mouth/Throat: Oropharynx is clear and moist.  Eyes: Conjunctivae are normal. Pupils are equal, round, and reactive to light.  Neck: Normal range of motion. No JVD present. No thyromegaly present.  Cardiovascular: Normal rate, regular rhythm, normal heart sounds and intact distal pulses.  Exam reveals no gallop and no friction rub.   No murmur heard. Pulmonary/Chest: Effort normal and breath sounds normal. No respiratory distress. He has no wheezes. He has no rales. He exhibits no tenderness.  Abdominal: Soft. Bowel sounds are normal. He exhibits no distension and no  mass. There is tenderness (mild in epig area). There is no rebound and no guarding.  Musculoskeletal: Normal range of motion. He exhibits no edema and no tenderness.  Lymphadenopathy:    He has no cervical adenopathy.  Neurological: He is alert and oriented to person, place, and time. He has normal reflexes. No cranial nerve deficit. He exhibits normal muscle tone. Coordination normal.  Skin: Skin is warm and dry. No rash noted.  Psychiatric: He has a normal mood and affect. His behavior is normal. Judgment and thought content normal.          Assessment & Plan:

## 2011-11-14 ENCOUNTER — Encounter: Payer: Self-pay | Admitting: Internal Medicine

## 2011-11-15 ENCOUNTER — Telehealth: Payer: Self-pay | Admitting: Internal Medicine

## 2011-11-15 NOTE — Telephone Encounter (Signed)
[name], please, inform patient that all labs are ok Thx 

## 2011-11-15 NOTE — Telephone Encounter (Signed)
Pt informed

## 2011-12-03 ENCOUNTER — Ambulatory Visit: Payer: Medicare HMO | Admitting: Internal Medicine

## 2011-12-13 ENCOUNTER — Encounter: Payer: Self-pay | Admitting: Internal Medicine

## 2011-12-13 ENCOUNTER — Other Ambulatory Visit (INDEPENDENT_AMBULATORY_CARE_PROVIDER_SITE_OTHER): Payer: Medicare HMO

## 2011-12-13 ENCOUNTER — Ambulatory Visit (INDEPENDENT_AMBULATORY_CARE_PROVIDER_SITE_OTHER): Payer: Medicare HMO | Admitting: Internal Medicine

## 2011-12-13 DIAGNOSIS — K219 Gastro-esophageal reflux disease without esophagitis: Secondary | ICD-10-CM

## 2011-12-13 DIAGNOSIS — K3189 Other diseases of stomach and duodenum: Secondary | ICD-10-CM

## 2011-12-13 DIAGNOSIS — R1013 Epigastric pain: Secondary | ICD-10-CM

## 2011-12-13 DIAGNOSIS — R1319 Other dysphagia: Secondary | ICD-10-CM

## 2011-12-13 DIAGNOSIS — R195 Other fecal abnormalities: Secondary | ICD-10-CM

## 2011-12-13 NOTE — Assessment & Plan Note (Signed)
Vague sxs not responsive to PPI, holding meds and many OTC meds. ? If could have celiac disease or functional disorder Evaluate with EGD

## 2011-12-13 NOTE — Patient Instructions (Signed)
Please go to the basement upon leaving today to have your labs done. You have been scheduled for an Endoscopy with separate instructions given.  

## 2011-12-13 NOTE — Assessment & Plan Note (Signed)
Some sxs of this - despite bid Nexium - taking Pepto Bismol at night Labelled as gastroparesis but he has normal GES in 2011??

## 2011-12-13 NOTE — Progress Notes (Signed)
Subjective:    Patient ID: Jacob Gordon, male    DOB: Oct 18, 1956, 56 y.o.   MRN: [id number]  HPI This pleasant middle-aged [place] American man is here to be evaluated because of nausea, loose stools, and difficulty swallowing. He describes intermittent loose stools, going on for a long time. In fact looking in his records here, he was seen in 2002 and had a colonoscopy with random biopsies that were normal, because he was having diarrhea and abdominal pain then. Over the years he has tried various over-the-counter supplements etc. without obvious success. He avoids milk products because he does think he is lactose intolerant. He has a lot of stomach upset and nausea after eating and will have urgent postprandial defecation at times. When he awakens at night with dyspepsia, Pepto-Bismol seems to provide relief. Been no provided some relief but not consistently. Alka seltzer has been tried with some benefit. He does not really vomit. He feels like his neck is swollen and inflamed, and that it is difficult to swallow, even saliva. There is no real impact dysphagia. He denies odynophagia. There is intermittent nausea. He takes Nexium 40 mg twice daily and it seems to help heartburn problems but he still has symptoms despite this. He has never had an upper endoscopy. He reports seeing an ENT physician a number of years ago to evaluate the swallowing issues in the neck symptoms and was told other than some swelling in there, and some redness that things were okay. He believes he was told that was related to reflux. He thinks that it's when Nexium was started. He denies any significant anxiety or stress. His past medical history indicated gastroparesis but 2011 gastric emptying study was normal. I have asked him about metoclopramide which is on his medication list, he is not currently using that and does not really recall it. Allergies  Allergen Reactions  . Caduet   . Pravastatin Sodium   . Valsartan      Outpatient Prescriptions Prior to Visit  Medication Sig Dispense Refill  . acetaminophen (TYLENOL) 500 MG tablet Take 500 mg by mouth every 6 (six) hours as needed.        Marland Kitchen amLODipine (NORVASC) 10 MG tablet Take 1 tablet (10 mg total) by mouth daily.  90 tablet  2  . clindamycin (CLEOCIN T) 1 % external solution Apply topically as directed.       Marland Kitchen esomeprazole (NEXIUM) 40 MG capsule Take 1 capsule (40 mg total) by mouth 2 (two) times daily.  180 capsule  2  . metoprolol (TOPROL-XL) 200 MG 24 hr tablet Take 1 tablet (200 mg total) by mouth daily.  90 tablet  2  . Pediatric Multivit-Minerals-C (KIDS GUMMY BEAR VITAMINS) CHEW Chew by mouth. Take 1 [place] gummy vitamin daily       . metFORMIN (GLUCOPHAGE) 500 MG tablet Take 1 tablet (500 mg total) by mouth daily.  90 tablet  2  . ergocalciferol (VITAMIN D2) 50000 UNITS capsule Take 1 capsule (50,000 Units total) by mouth every 30 (thirty) days.  3 capsule  3  . metoCLOPramide (REGLAN) 10 MG tablet Take 10 mg by mouth 3 (three) times daily as needed.      Marland Kitchen dextromethorphan-guaiFENesin (MUCINEX DM) 30-600 MG per 12 hr tablet Take 1 tablet by mouth every 12 (twelve) hours.        . finasteride (PROSCAR) 5 MG tablet Take 1 tablet (5 mg total) by mouth daily.  30 tablet  2  . rosuvastatin (  CRESTOR) 5 MG tablet Take 1 tablet (5 mg total) by mouth at bedtime. As dirrected  30 tablet  11  . vitamin B-12 (CYANOCOBALAMIN) 500 MCG tablet Take 500 mcg by mouth daily.         Past Medical History  Diagnosis Date  . DIABETES MELLITUS, TYPE II   . THROMBOCYTOPENIA   . HYPERTENSION   . ALLERGIC RHINITIS   . GERD   . OSTEOARTHRITIS   . DEGENERATIVE DISC DISEASE   . HLD (hyperlipidemia)   . Internal hemorrhoids   . Chronic headaches    Past Surgical History  Procedure Date  . Tonsillectomy and adenoidectomy   . Colonoscopy 10/01/11    internal hemorrhoids  . Shoulder surgery     right rotator cuff surg  . Vasectomy    History   Social  History  . Marital Status: Married    Spouse Name: N/A    Number of Children: 1  .     Occupational History  . Administrative     Social History Main Topics  . Smoking status: Never Smoker   . Smokeless tobacco: Never Used  . Alcohol Use: No  . Drug Use: No  .        .    Social History Narrative   Caffeine daily    Family History  Problem Relation Age of Onset  . Colon cancer Neg Hx   . Diabetes Sister         Review of Systems Positive for arthritis, headaches, night sweats, nocturia, in those things mentioned above. Alder abuse systems negative.    Objective:   Physical Exam General:  Well-developed, well-nourished and in no acute distress Eyes:  anicteric. Bilateral arcus ENT:   Mouth and posterior pharynx free of lesions.  Neck:   supple w/o thyromegaly or mass.  Lungs: Clear to auscultation bilaterally. Heart:  S1S2, no rubs, murmurs, gallops. Abdomen:  soft, non-tender, no hepatosplenomegaly, hernia, or mass and BS+. There is no succussion splash Lymph:  no cervical or supraclavicular adenopathy. Extremities:   no edema Skin   no rash. Neuro:  A&O x 3.  Psych:  Mildly flat affect   Data Reviewed:  2002 colonoscopy and biopsies, 2002 GI office note, recent primary care office note, recent labs, gastric emptying study 2011      Assessment & Plan:

## 2011-12-13 NOTE — Assessment & Plan Note (Signed)
Vague - evaluate with EGD ? Needs dilation The risks and benefits as well as alternatives of endoscopic procedure(s) have been discussed and reviewed. All questions answered. The patient agrees to proceed.

## 2011-12-13 NOTE — Assessment & Plan Note (Addendum)
?   IBS, ? meds ? Celiac disease Check TTG Ab Evaluate with EGD - possible small bowel bx Colonoscopy ok (no bxs) screening in late 2012 2002 colonoscopy - random biopsies were normal I am very suspicious of IBS - has had intermittent sxs since 2002 at least

## 2011-12-17 NOTE — Progress Notes (Signed)
Quick Note:  Negative for celiac disease Will notify at EGD ______

## 2011-12-25 ENCOUNTER — Ambulatory Visit (AMBULATORY_SURGERY_CENTER): Payer: Medicare HMO | Admitting: Internal Medicine

## 2011-12-25 ENCOUNTER — Encounter: Payer: Self-pay | Admitting: Internal Medicine

## 2011-12-25 VITALS — BP 116/77 | HR 61 | Temp 95.6°F | Resp 12 | Ht 69.0 in | Wt 198.0 lb

## 2011-12-25 DIAGNOSIS — R195 Other fecal abnormalities: Secondary | ICD-10-CM

## 2011-12-25 DIAGNOSIS — R1013 Epigastric pain: Secondary | ICD-10-CM

## 2011-12-25 DIAGNOSIS — R1319 Other dysphagia: Secondary | ICD-10-CM

## 2011-12-25 DIAGNOSIS — K219 Gastro-esophageal reflux disease without esophagitis: Secondary | ICD-10-CM

## 2011-12-25 MED ORDER — SODIUM CHLORIDE 0.9 % IV SOLN: 500.0000 mL | INTRAVENOUS | Status: DC

## 2011-12-25 MED ORDER — DICYCLOMINE HCL 20 MG PO TABS: 20.0000 mg | ORAL_TABLET | Freq: Three times a day (TID) | ORAL | Status: DC

## 2011-12-25 NOTE — Op Note (Signed)
[facility] [address] [place], [place]  [address]  ENDOSCOPY PROCEDURE REPORT  PATIENT:  Jacob Gordon, Jacob Gordon  MR#:  [id number] BIRTHDATE:  10/04/1956, 55 yrs. old  GENDER:  male  ENDOSCOPIST:  Iva Boop, MD, [facility]  PROCEDURE DATE:  12/25/2011 PROCEDURE:  EGD, diagnostic 43235, Maloney Dilation of Esophagus ASA CLASS:  Class II INDICATIONS:  dysphagia, dyspepsia  MEDICATIONS:   These medications were titrated to patient response per physician's verbal order, Fentanyl 50 mcg IV, Versed 4 mg IV TOPICAL ANESTHETIC:  Cetacaine Spray  DESCRIPTION OF PROCEDURE:   After the risks benefits and alternatives of the procedure were thoroughly explained, informed consent was obtained.  The LB [id number]-[id number] endoscope was introduced through the mouth and advanced to the second portion of the duodenum, without limitations.  The instrument was slowly withdrawn as the mucosa was fully examined. <<PROCEDUREIMAGES>>  The upper, middle, and distal third of the esophagus were carefully inspected and no abnormalities were noted. The z-line was well seen at the GEJ. The endoscope was pushed into the fundus which was normal including a retroflexed view. The antrum,gastric body, first and second part of the duodenum were unremarkable. Retroflexed views revealed no abnormalities.    The scope was then withdrawn from the patient, a 64 [place] Maloney dilation was performed with minimal resistance and no heme, and the procedure completed.  COMPLICATIONS:  None  ENDOSCOPIC IMPRESSION: 1) Normal EGD - 54 [place] Maloney dilation performed for dysphagia RECOMMENDATIONS: 1) post-dilation diet today 2) start dicyclomine 20 mg ac/hs for dyspepsia and loose stools - Rx sent - I think he has IBS 3) follow-up GI as needed  Iva Boop, MD, [facility]  CC:  The Patient and Linda Hedges. Plotnikov, MD  n. Rosalie DoctorIva Boop at 12/25/2011 02:08 PM  Hilario Quarry, [id number]

## 2011-12-25 NOTE — Patient Instructions (Addendum)
The endoscopy exam looked normal. I dilated the esophagus to help your swallowing. I have prescribed dicyclomine to help with the nausea and loose stools you have been having. Please pick that up at your pharmacy. I think you have irritable bowel syndrome causing many of your symptoms. Follow-up with me as needed. Iva Boop, MD, [facility]     Esophageal Dilatation The esophagus is the long, narrow tube which carries food and liquid from the mouth to the stomach. Esophageal dilatation is the technique used to stretch a blocked or narrowed portion of the esophagus. This procedure is used when a part of the esophagus has become so narrow that it becomes difficult, painful or even impossible to swallow. This is generally an uncomplicated form of treatment. When this is not successful, chest surgery may be required. This is a much more extensive form of treatment with a longer recovery time. CAUSES  Some of the more common causes of blockage or strictures of the esophagus are:  Narrowing from longstanding inflammation (soreness and redness) of the lower esophagus. This comes from the constant exposure of the lower esophagus to the acid which bubbles up from the stomach. Over time this causes scarring and narrowing of the lower esophagus.   Hiatal hernia in which a small part of the stomach bulges (herniates) up through the diaphragm. This can cause a gradual narrowing of the end of the esophagus.   Schatzki's Ring is a narrow ring of benign (non-cancerous) fibrous tissue which constricts the lower esophagus. The reason for this is not known.   Scleroderma is a connective tissue disorder that affects the esophagus and makes swallowing difficult.   Achalasia is an absence of nerves to the lower esophagus and to the esophageal sphincter. This is the circular muscle between the stomach and esophagus that relaxes to allow food into the stomach. After swallowing, it contracts to keep food in the stomach.  This absence of nerves may be congenital (present since birth). This can cause irregular spasms of the lower esophageal muscle. This spasm does not open up to allow food and fluid through. The result is a persistent blockage with subsequent slow trickling of the esophageal contents into the stomach.   Strictures may develop from swallowing materials which damage the esophagus. Some examples are strong acids or alkalis such as lye.   Growths such as benign (non-cancerous) and malignant (cancerous) tumors can block the esophagus.   Heredity (present since birth) causes.  DIAGNOSIS  Your caregiver often suspects this problem by taking a medical history. They will also do a physical exam. They can then prove their suspicions using X-rays and endoscopy. Endoscopy is an exam in which a tube like a small flexible telescope is used to look at your esophagus.  TREATMENT There are different stretching (dilating) techniques which can be used. Simple bougie dilatation may be done in the office. This usually takes only a couple minutes. A numbing (anesthetic) spray of the throat is used. Endoscopy, when done, is done in an endoscopy suite, under mild sedation. When fluoroscopy is used, the procedure is performed in X-ray. Other techniques require a little longer time. Recovery is usually quick. There is no waiting time to begin eating and drinking to test success of the treatment. Following are some of the methods used. Narrowing of the esophagus is treated by making it bigger. Commonly this is a mechanical problem which can be treated with stretching. This can be done in different ways. Your caregiver will discuss these  with you. Some of the means used are:  A series of graduated (increasing thickness) flexible dilators can be used. These are weighted tubes passed through the esophagus into the stomach. The tubes used become progressively larger until the desired stretched size is reached. Graduated dilators are a  simple and quick way of opening the esophagus. No visualization is required.   Another method is the use of endoscopy to place a flexible wire across the stricture. The endoscope is removed and the wire left in place. A dilator with a hole through it from end to end is guided down the esophagus and across the stricture. One or more of these dilators are passed over the wire. At the end of the exam, the wire is removed. This type of treatment may be performed in the X-ray department under fluoroscopy. An advantage of this procedure is the examiner is visualizing the end opening in the esophagus.   Stretching of the esophagus may be done using balloons. Deflated balloons are placed through the endoscope and across the stricture. This type of balloon dilatation is often done at the time of endoscopy or fluoroscopy. Flexible endoscopy allows the examiner to directly view the stricture. A balloon is inserted in the deflated form into the area of narrowing. It is then inflated with air to a certain pressure that is pre-set for a given circumference. When inflated, it becomes sausage shaped, stretched, and makes the stricture larger.   Achalasia requires a longer larger balloon-type dilator. This is frequently done under X-ray control. In this situation, the spastic muscle fibers in the lower esophagus are stretched.  All of the above procedures make the passage of food and water into the stomach easier. They also make it easier for stomach contents to reflux back into the esophagus. Special medications may be used following the procedure to help prevent further stricturing. Proton-pump inhibitor medications are good at decreasing the amount of acid in the stomach juice. When stomach juice refluxes into the esophagus, the juice is no longer as acidic and is less likely to burn or scar the esophagus. RISKS AND COMPLICATIONS Esophageal dilatation is usually performed effectively and without problems. Some  complications that can occur are:  A small amount of bleeding almost always happens where the stretching takes place. If this is too excessive it may require more aggressive treatment.   An uncommon complication is perforation (making a hole) of the esophagus. The esophagus is thin. It is easy to make a hole in it. If this happens, an operation may be necessary to repair this.   A small, undetected perforation could lead to an infection in the chest. This can be very serious.  HOME CARE INSTRUCTIONS   If you received sedation for your procedure, do not drive, make important decisions, or perform any activities requiring your full coordination. Do not drink alcohol, take sedatives, or use any mind altering chemicals unless instructed by your caregiver.   You may use throat lozenges or warm salt water gargles if you have throat discomfort   You can begin eating and drinking normally on return home unless instructed otherwise. Do not purposely try to force large chunks of food down to test the benefits of your procedure.   Mild discomfort can be eased with sips of ice water.   Medications for discomfort may or may not be needed.  SEEK IMMEDIATE MEDICAL CARE IF:   You begin vomiting up blood.   You develop black tarry stools   You develop  chills or an unexplained temperature of over 101 F (38.3 C)   You develop chest or abdominal pain.   You develop shortness of breath or feel lightheaded or faint.   Your swallowing is becoming more painful, difficult, or you are unable to swallow.  MAKE SURE YOU:   Understand these instructions.   Will watch your condition.   Will get help right away if you are not doing well or get worse.  Document Released: 12/12/2005 Document Revised: 07/03/2011 Document Reviewed: 01/29/2006 [facility] Patient Information 2012 [employer]    LIQUIDS ONLY FOR 1 HOUR ( UNTIL 3:30 TODAY) THEN SOFT FOODS THE REST OF TODAY. RESUME NORMAL DIET TOMORROW.YOU  HAD AN ENDOSCOPIC PROCEDURE TODAY AT THE [facility]: Refer to the procedure report that was given to you for any specific questions about what was found during the examination.  If the procedure report does not answer your questions, please call your gastroenterologist to clarify.  If you requested that your care partner not be given the details of your procedure findings, then the procedure report has been included in a sealed envelope for you to review at your convenience later.  YOU SHOULD EXPECT: Some feelings of bloating in the abdomen. Passage of more gas than usual.  Walking can help get rid of the air that was put into your GI tract during the procedure and reduce the bloating. If you had a lower endoscopy (such as a colonoscopy or flexible sigmoidoscopy) you may notice spotting of blood in your stool or on the toilet paper. If you underwent a bowel prep for your procedure, then you may not have a normal bowel movement for a few days.  DIET: Your first meal following the procedure should be a light meal and then it is ok to progress to your normal diet.  A half-sandwich or bowl of soup is an example of a good first meal.  Heavy or fried foods are harder to digest and may make you feel nauseous or bloated.  Likewise meals heavy in dairy and vegetables can cause extra gas to form and this can also increase the bloating.  Drink plenty of fluids but you should avoid alcoholic beverages for 24 hours.  ACTIVITY: Your care partner should take you home directly after the procedure.  You should plan to take it easy, moving slowly for the rest of the day.  You can resume normal activity the day after the procedure however you should NOT DRIVE or use heavy machinery for 24 hours (because of the sedation medicines used during the test).    SYMPTOMS TO REPORT IMMEDIATELY: A gastroenterologist can be reached at any hour.  During normal business hours, 8:30 AM to 5:00 PM Monday through Friday, call  [phone].  After hours and on weekends, please call the GI answering service at [phone] who will take a message and have the physician on call contact you.   Following lower endoscopy (colonoscopy or flexible sigmoidoscopy):  Excessive amounts of blood in the stool  Significant tenderness or worsening of abdominal pains  Swelling of the abdomen that is new, acute  Fever of 100F or higher  Following upper endoscopy (EGD)  Vomiting of blood or coffee ground material  New chest pain or pain under the shoulder blades  Painful or persistently difficult swallowing  New shortness of breath  Fever of 100F or higher  Black, tarry-looking stools  FOLLOW UP: If any biopsies were taken you will be contacted by phone or  by letter within the next 1-3 weeks.  Call your gastroenterologist if you have not heard about the biopsies in 3 weeks.  Our staff will call the home number listed on your records the next business day following your procedure to check on you and address any questions or concerns that you may have at that time regarding the information given to you following your procedure. This is a courtesy call and so if there is no answer at the home number and we have not heard from you through the emergency physician on call, we will assume that you have returned to your regular daily activities without incident.  SIGNATURES/CONFIDENTIALITY: You and/or your care partner have signed paperwork which will be entered into your electronic medical record.  These signatures attest to the fact that that the information above on your After Visit Summary has been reviewed and is understood.  Full responsibility of the confidentiality of this discharge information lies with you and/or your care-partner.

## 2011-12-25 NOTE — Progress Notes (Signed)
Patient did not experience any of the following events: a burn prior to discharge; a fall within the facility; wrong site/side/patient/procedure/implant event; or a hospital transfer or hospital admission upon discharge from the facility. (G8907) Patient did not have preoperative order for IV antibiotic SSI prophylaxis. (G8918)  

## 2011-12-26 ENCOUNTER — Telehealth: Payer: Self-pay | Admitting: *Deleted

## 2011-12-26 NOTE — Telephone Encounter (Signed)
  Follow up Call-  Call back number 12/25/2011 10/01/2011  Post procedure Call Back phone  # [phone]  Permission to leave phone message Yes -     Patient questions:   Message left for pt to call if necessary.

## 2011-12-27 LAB — GLUCOSE, CAPILLARY: Glucose-Capillary: 104 mg/dL — ABNORMAL HIGH (ref 70–99)

## 2012-01-08 ENCOUNTER — Telehealth: Payer: Self-pay | Admitting: *Deleted

## 2012-01-08 MED ORDER — DICYCLOMINE HCL 20 MG PO TABS: 20.0000 mg | ORAL_TABLET | Freq: Three times a day (TID) | ORAL | Status: DC

## 2012-01-08 MED ORDER — ESOMEPRAZOLE MAGNESIUM 40 MG PO CPDR: 40.0000 mg | DELAYED_RELEASE_CAPSULE | Freq: Two times a day (BID) | ORAL | Status: DC

## 2012-01-08 MED ORDER — AMLODIPINE BESYLATE 10 MG PO TABS: 10.0000 mg | ORAL_TABLET | Freq: Every day | ORAL | Status: DC

## 2012-01-08 MED ORDER — CLINDAMYCIN PHOSPHATE 1 % EX SOLN: CUTANEOUS | Status: DC

## 2012-01-08 MED ORDER — ERGOCALCIFEROL 1.25 MG (50000 UT) PO CAPS: 50000.0000 [IU] | ORAL_CAPSULE | ORAL | Status: DC

## 2012-01-08 MED ORDER — METOPROLOL SUCCINATE ER 200 MG PO TB24: 200.0000 mg | ORAL_TABLET | Freq: Every day | ORAL | Status: DC

## 2012-01-08 MED ORDER — METFORMIN HCL 500 MG PO TABS: 500.0000 mg | ORAL_TABLET | Freq: Every day | ORAL | Status: DC

## 2012-01-08 NOTE — Telephone Encounter (Signed)
Pt's wife calling stating pt's ins plan has changed and all his meds need to be sent to [facility] home delivery. Done

## 2012-04-23 ENCOUNTER — Telehealth: Payer: Self-pay | Admitting: Internal Medicine

## 2012-04-23 ENCOUNTER — Ambulatory Visit: Payer: PRIVATE HEALTH INSURANCE | Admitting: Internal Medicine

## 2012-04-23 DIAGNOSIS — Z0289 Encounter for other administrative examinations: Secondary | ICD-10-CM

## 2012-04-23 NOTE — Telephone Encounter (Signed)
WANTS TO BE WORKED IN FOR DIARRHEA THAT MAY BE RELATED TO HIS MEDS WK OF June 24-28.

## 2012-04-27 NOTE — Telephone Encounter (Signed)
Ok this [employer] after 1 pm Thx

## 2012-04-28 NOTE — Telephone Encounter (Signed)
APPT MADE FOR THURS AT 4:15.

## 2012-04-30 ENCOUNTER — Encounter: Payer: Self-pay | Admitting: Internal Medicine

## 2012-04-30 ENCOUNTER — Ambulatory Visit (INDEPENDENT_AMBULATORY_CARE_PROVIDER_SITE_OTHER): Payer: Managed Care, Other (non HMO) | Admitting: Internal Medicine

## 2012-04-30 VITALS — BP 102/70 | HR 55 | Temp 98.1°F | Resp 16 | Wt 197.0 lb

## 2012-04-30 DIAGNOSIS — R1013 Epigastric pain: Secondary | ICD-10-CM

## 2012-04-30 DIAGNOSIS — K219 Gastro-esophageal reflux disease without esophagitis: Secondary | ICD-10-CM

## 2012-04-30 DIAGNOSIS — E119 Type 2 diabetes mellitus without complications: Secondary | ICD-10-CM

## 2012-04-30 DIAGNOSIS — K3189 Other diseases of stomach and duodenum: Secondary | ICD-10-CM

## 2012-04-30 DIAGNOSIS — I10 Essential (primary) hypertension: Secondary | ICD-10-CM

## 2012-04-30 MED ORDER — COLESEVELAM HCL 625 MG PO TABS: 1875.0000 mg | ORAL_TABLET | Freq: Two times a day (BID) | ORAL | Status: DC

## 2012-04-30 MED ORDER — LINAGLIPTIN 5 MG PO TABS: 5.0000 mg | ORAL_TABLET | Freq: Every day | ORAL | Status: DC

## 2012-04-30 NOTE — Assessment & Plan Note (Signed)
See Rx change Labs in 3 mo

## 2012-04-30 NOTE — Patient Instructions (Signed)
Stop Metformin

## 2012-04-30 NOTE — Assessment & Plan Note (Signed)
Worse 6/13 ?due to metformin D/c metformin

## 2012-04-30 NOTE — Assessment & Plan Note (Signed)
Continue with current prescription therapy as reflected on the Med list.  

## 2012-04-30 NOTE — Progress Notes (Signed)
   Subjective:    Patient ID: Jacob Gordon, male    DOB: 23-Dec-1955, 56 y.o.   MRN: [id number]  HPI  C/o indigestion and nausea every day; food is not making it better, then nausea comes in a couple hrs. No night sx's. C/o diarrhea after meals daily - worse at night. Taking a lot of indigestion meds - TUMs help.Marland KitchenMarland KitchenCrestor seems to bother - he stopped it 6 wks ago.  Review of Systems  Constitutional: Negative for appetite change, fatigue and unexpected weight change.  HENT: Positive for trouble swallowing. Negative for nosebleeds, congestion, sore throat, sneezing and neck pain.   Eyes: Negative for itching and visual disturbance.  Cardiovascular: Negative for chest pain, palpitations and leg swelling.  Gastrointestinal: Positive for nausea, diarrhea and abdominal distention. Negative for vomiting, abdominal pain, constipation and blood in stool.       ?dysphagia  Genitourinary: Negative for frequency and hematuria.  Musculoskeletal: Negative for back pain, joint swelling and gait problem.  Skin: Negative for rash.  Neurological: Negative for dizziness, tremors, speech difficulty and weakness.  Psychiatric/Behavioral: Negative for suicidal ideas, disturbed wake/sleep cycle, dysphoric mood and agitation. The patient is not nervous/anxious.        Objective:   Physical Exam  Constitutional: He is oriented to person, place, and time. He appears well-developed.  HENT:  Mouth/Throat: Oropharynx is clear and moist.  Eyes: Conjunctivae are normal. Pupils are equal, round, and reactive to light.  Neck: Normal range of motion. No JVD present. No thyromegaly present.  Cardiovascular: Normal rate, regular rhythm, normal heart sounds and intact distal pulses.  Exam reveals no gallop and no friction rub.   No murmur heard. Pulmonary/Chest: Effort normal and breath sounds normal. No respiratory distress. He has no wheezes. He has no rales. He exhibits no tenderness.  Abdominal: Soft. Bowel  sounds are normal. He exhibits no distension and no mass. There is tenderness (mild in epig area). There is no rebound and no guarding.  Musculoskeletal: Normal range of motion. He exhibits no edema and no tenderness.  Lymphadenopathy:    He has no cervical adenopathy.  Neurological: He is alert and oriented to person, place, and time. He has normal reflexes. No cranial nerve deficit. He exhibits normal muscle tone. Coordination normal.  Skin: Skin is warm and dry. No rash noted.  Psychiatric: He has a normal mood and affect. His behavior is normal. Judgment and thought content normal.          Assessment & Plan:

## 2012-04-30 NOTE — Assessment & Plan Note (Signed)
nexium did not help D/c Metformin, call in 2 wks

## 2012-07-29 ENCOUNTER — Encounter: Payer: Self-pay | Admitting: Internal Medicine

## 2012-07-29 ENCOUNTER — Ambulatory Visit (INDEPENDENT_AMBULATORY_CARE_PROVIDER_SITE_OTHER): Payer: Managed Care, Other (non HMO) | Admitting: Internal Medicine

## 2012-07-29 VITALS — BP 140/62 | HR 72 | Temp 97.9°F | Resp 16 | Wt 188.0 lb

## 2012-07-29 DIAGNOSIS — Z566 Other physical and mental strain related to work: Secondary | ICD-10-CM | POA: Insufficient documentation

## 2012-07-29 DIAGNOSIS — R519 Headache, unspecified: Secondary | ICD-10-CM | POA: Insufficient documentation

## 2012-07-29 DIAGNOSIS — E119 Type 2 diabetes mellitus without complications: Secondary | ICD-10-CM

## 2012-07-29 DIAGNOSIS — R51 Headache: Secondary | ICD-10-CM

## 2012-07-29 DIAGNOSIS — Z569 Unspecified problems related to employment: Secondary | ICD-10-CM

## 2012-07-29 DIAGNOSIS — I10 Essential (primary) hypertension: Secondary | ICD-10-CM

## 2012-07-29 DIAGNOSIS — G4700 Insomnia, unspecified: Secondary | ICD-10-CM | POA: Insufficient documentation

## 2012-07-29 MED ORDER — NORTRIPTYLINE HCL 10 MG PO CAPS: 10.0000 mg | ORAL_CAPSULE | Freq: Every day | ORAL | Status: DC

## 2012-07-29 MED ORDER — CLONAZEPAM 0.5 MG PO TABS: 0.5000 mg | ORAL_TABLET | Freq: Two times a day (BID) | ORAL | Status: DC | PRN

## 2012-07-29 MED ORDER — BUTALBITAL-ACETAMINOPHEN 50-300 MG PO TABS: 1.0000 | ORAL_TABLET | Freq: Two times a day (BID) | ORAL | Status: DC | PRN

## 2012-07-29 NOTE — Assessment & Plan Note (Signed)
Continue with current prescription therapy as reflected on the Med list.  

## 2012-07-29 NOTE — Assessment & Plan Note (Signed)
Nortriptyline qhs Clonazepam prn

## 2012-07-29 NOTE — Assessment & Plan Note (Signed)
Discussed  Dr Dellia Cloud

## 2012-07-29 NOTE — Assessment & Plan Note (Signed)
BUPAP prn

## 2012-07-29 NOTE — Progress Notes (Signed)
Subjective:    Patient ID: Jacob Gordon, male    DOB: 10-02-1956, 56 y.o.   MRN: [id number]  HPI C/o stress at work since April 2013. He is being intimidated and bullied by a co-worker C/o insomnia and back HAs due to stress   F/u indigestion and nausea every day - better; food is not making it better, then nausea comes in a couple hrs. No night sx's. C/o diarrhea after meals daily - worse at night. Taking a lot of indigestion meds - TUMs help.Marland KitchenMarland KitchenCrestor seems to bother - he stopped it   Wt Readings from Last 3 Encounters:  07/29/12 188 lb (85.276 kg)  04/30/12 197 lb (89.359 kg)  12/25/11 198 lb (89.812 kg)   BP Readings from Last 3 Encounters:  07/29/12 140/62  04/30/12 102/70  12/25/11 116/77      Review of Systems  Constitutional: Negative for appetite change, fatigue and unexpected weight change.  HENT: Negative for nosebleeds, congestion, sore throat, sneezing, trouble swallowing and neck pain.   Eyes: Negative for itching and visual disturbance.  Cardiovascular: Negative for chest pain, palpitations and leg swelling.  Gastrointestinal: Positive for nausea. Negative for vomiting, abdominal pain, diarrhea, constipation, blood in stool and abdominal distention.       ?dysphagia  Genitourinary: Negative for frequency and hematuria.  Musculoskeletal: Negative for back pain, joint swelling and gait problem.  Skin: Negative for rash.  Neurological: Negative for dizziness, tremors, speech difficulty and weakness.  Psychiatric/Behavioral: Negative for suicidal ideas, disturbed wake/sleep cycle, dysphoric mood and agitation. The patient is not nervous/anxious.        Not homicidal       Objective:   Physical Exam  Constitutional: He is oriented to person, place, and time. He appears well-developed and well-nourished. No distress.  HENT:  Mouth/Throat: Oropharynx is clear and moist.  Eyes: Conjunctivae normal are normal. Pupils are equal, round, and reactive to  light.  Neck: Normal range of motion. No JVD present. No thyromegaly present.  Cardiovascular: Normal rate, regular rhythm, normal heart sounds and intact distal pulses.  Exam reveals no gallop and no friction rub.   No murmur heard. Pulmonary/Chest: Effort normal and breath sounds normal. No respiratory distress. He has no wheezes. He has no rales. He exhibits no tenderness.  Abdominal: Soft. Bowel sounds are normal. He exhibits no distension and no mass. There is tenderness (mild in epig area). There is no rebound and no guarding.  Musculoskeletal: Normal range of motion. He exhibits no edema and no tenderness.  Lymphadenopathy:    He has no cervical adenopathy.  Neurological: He is alert and oriented to person, place, and time. He has normal reflexes. No cranial nerve deficit. He exhibits normal muscle tone. Coordination normal.  Skin: Skin is warm and dry. No rash noted.  Psychiatric: He has a normal mood and affect. His behavior is normal. Judgment and thought content normal.       sad   Lab Results  Component Value Date   WBC 6.3 11/13/2011   HGB 15.8 11/13/2011   HCT 46.7 11/13/2011   PLT 134.0* 11/13/2011   GLUCOSE 101* 11/13/2011   CHOL 238* 11/13/2011   TRIG 146.0 11/13/2011   HDL 53.40 11/13/2011   LDLDIRECT 151.2 11/13/2011   LDLCALC 74 06/01/2009   ALT 40 11/13/2011   AST 30 11/13/2011   NA 143 11/13/2011   K 4.1 11/13/2011   CL 106 11/13/2011   CREATININE 1.2 11/13/2011   BUN 17 11/13/2011  CO2 29 11/13/2011   TSH 1.09 07/03/2011   PSA 0.63 07/03/2011   HGBA1C 6.5 11/13/2011   MICROALBUR 0.7 11/21/2008           Assessment & Plan:

## 2012-08-12 ENCOUNTER — Ambulatory Visit: Payer: Managed Care, Other (non HMO) | Admitting: Psychology

## 2012-08-24 ENCOUNTER — Ambulatory Visit (INDEPENDENT_AMBULATORY_CARE_PROVIDER_SITE_OTHER): Payer: Managed Care, Other (non HMO) | Admitting: Psychology

## 2012-08-24 DIAGNOSIS — F411 Generalized anxiety disorder: Secondary | ICD-10-CM

## 2012-08-25 ENCOUNTER — Ambulatory Visit (INDEPENDENT_AMBULATORY_CARE_PROVIDER_SITE_OTHER): Payer: Managed Care, Other (non HMO)

## 2012-08-25 DIAGNOSIS — Z23 Encounter for immunization: Secondary | ICD-10-CM

## 2012-09-02 ENCOUNTER — Ambulatory Visit (INDEPENDENT_AMBULATORY_CARE_PROVIDER_SITE_OTHER): Payer: Managed Care, Other (non HMO) | Admitting: Psychology

## 2012-09-02 DIAGNOSIS — F411 Generalized anxiety disorder: Secondary | ICD-10-CM

## 2012-09-11 ENCOUNTER — Ambulatory Visit (INDEPENDENT_AMBULATORY_CARE_PROVIDER_SITE_OTHER): Payer: Managed Care, Other (non HMO) | Admitting: Psychology

## 2012-09-11 DIAGNOSIS — F411 Generalized anxiety disorder: Secondary | ICD-10-CM

## 2012-09-30 ENCOUNTER — Ambulatory Visit: Payer: Managed Care, Other (non HMO) | Admitting: Internal Medicine

## 2012-09-30 ENCOUNTER — Encounter: Payer: Self-pay | Admitting: Internal Medicine

## 2012-09-30 ENCOUNTER — Ambulatory Visit (INDEPENDENT_AMBULATORY_CARE_PROVIDER_SITE_OTHER): Payer: Managed Care, Other (non HMO) | Admitting: Internal Medicine

## 2012-09-30 VITALS — BP 102/78 | HR 88 | Temp 98.3°F | Resp 16 | Wt 193.0 lb

## 2012-09-30 DIAGNOSIS — E119 Type 2 diabetes mellitus without complications: Secondary | ICD-10-CM

## 2012-09-30 DIAGNOSIS — I10 Essential (primary) hypertension: Secondary | ICD-10-CM

## 2012-09-30 DIAGNOSIS — Z569 Unspecified problems related to employment: Secondary | ICD-10-CM

## 2012-09-30 DIAGNOSIS — R61 Generalized hyperhidrosis: Secondary | ICD-10-CM | POA: Insufficient documentation

## 2012-09-30 DIAGNOSIS — Z566 Other physical and mental strain related to work: Secondary | ICD-10-CM

## 2012-09-30 DIAGNOSIS — N400 Enlarged prostate without lower urinary tract symptoms: Secondary | ICD-10-CM

## 2012-09-30 MED ORDER — CARVEDILOL 12.5 MG PO TABS: 12.5000 mg | ORAL_TABLET | Freq: Two times a day (BID) | ORAL | Status: DC

## 2012-09-30 MED ORDER — NORTRIPTYLINE HCL 10 MG PO CAPS: 10.0000 mg | ORAL_CAPSULE | Freq: Every day | ORAL | Status: DC

## 2012-09-30 MED ORDER — NORTRIPTYLINE HCL 10 MG PO CAPS: 20.0000 mg | ORAL_CAPSULE | Freq: Every day | ORAL | Status: DC

## 2012-09-30 NOTE — Assessment & Plan Note (Signed)
doing better

## 2012-09-30 NOTE — Assessment & Plan Note (Signed)
He is seeing Dr Dellia Cloud Continue with current prescription therapy as reflected on the Med list.

## 2012-09-30 NOTE — Assessment & Plan Note (Addendum)
I added Coreg - take if SBP is high consistently

## 2012-09-30 NOTE — Progress Notes (Signed)
Patient ID: Jacob Gordon, male   DOB: July 14, 1956, 56 y.o.   MRN: [id number]   Subjective:    Patient ID: Jacob Gordon, male    DOB: 05-07-1956, 56 y.o.   MRN: [id number]  HPI C/o stress at work since April 2013. He is being intimidated and bullied by a co-worker. He has been seeing Dr Dellia Cloud C/o insomnia and back HAs due to stress, sweats   F/u indigestion and nausea every day - better; food is not making it better, then nausea comes in a couple hrs. No night sx's. C/o diarrhea after meals daily - worse at night. Taking a lot of indigestion meds - TUMs help.Marland KitchenMarland KitchenCrestor seems to bother - he stopped it        Review of Systems  Constitutional: Negative for appetite change, fatigue and unexpected weight change.  HENT: Negative for nosebleeds, congestion, sore throat, sneezing, trouble swallowing and neck pain.   Eyes: Negative for itching and visual disturbance.  Cardiovascular: Negative for chest pain, palpitations and leg swelling.  Gastrointestinal: Positive for nausea. Negative for vomiting, abdominal pain, diarrhea, constipation, blood in stool and abdominal distention.       ?dysphagia  Genitourinary: Negative for frequency and hematuria.  Musculoskeletal: Negative for back pain, joint swelling and gait problem.  Skin: Negative for rash.  Neurological: Negative for dizziness, tremors, speech difficulty and weakness.  Psychiatric/Behavioral: Negative for suicidal ideas, disturbed wake/sleep cycle, dysphoric mood and agitation. The patient is not nervous/anxious.        Not homicidal   BP 102/78  Pulse 88  Temp 98.3 F (36.8 C) (Oral)  Resp 16  Wt 193 lb (87.544 kg)     Objective:   Physical Exam  Constitutional: He is oriented to person, place, and time. He appears well-developed and well-nourished. No distress.  HENT:  Mouth/Throat: Oropharynx is clear and moist.  Eyes: Conjunctivae normal are normal. Pupils are equal, round, and reactive to light.    Neck: Normal range of motion. No JVD present. No thyromegaly present.  Cardiovascular: Normal rate, regular rhythm, normal heart sounds and intact distal pulses.  Exam reveals no gallop and no friction rub.   No murmur heard. Pulmonary/Chest: Effort normal and breath sounds normal. No respiratory distress. He has no wheezes. He has no rales. He exhibits no tenderness.  Abdominal: Soft. Bowel sounds are normal. He exhibits no distension and no mass. There is tenderness (mild in epig area). There is no rebound and no guarding.  Musculoskeletal: Normal range of motion. He exhibits no edema and no tenderness.  Lymphadenopathy:    He has no cervical adenopathy.  Neurological: He is alert and oriented to person, place, and time. He has normal reflexes. No cranial nerve deficit. He exhibits normal muscle tone. Coordination normal.  Skin: Skin is warm and dry. No rash noted.  Psychiatric: He has a normal mood and affect. His behavior is normal. Judgment and thought content normal.        Lab Results  Component Value Date   WBC 6.3 11/13/2011   HGB 15.8 11/13/2011   HCT 46.7 11/13/2011   PLT 134.0* 11/13/2011   GLUCOSE 101* 11/13/2011   CHOL 238* 11/13/2011   TRIG 146.0 11/13/2011   HDL 53.40 11/13/2011   LDLDIRECT 151.2 11/13/2011   LDLCALC 74 06/01/2009   ALT 40 11/13/2011   AST 30 11/13/2011   NA 143 11/13/2011   K 4.1 11/13/2011   CL 106 11/13/2011   CREATININE 1.2 11/13/2011   BUN 17  11/13/2011   CO2 29 11/13/2011   TSH 1.09 07/03/2011   PSA 0.63 07/03/2011   HGBA1C 6.5 11/13/2011   MICROALBUR 0.7 11/21/2008           Assessment & Plan:

## 2012-09-30 NOTE — Assessment & Plan Note (Signed)
Continue with current prescription therapy as reflected on the Med list.  

## 2012-09-30 NOTE — Assessment & Plan Note (Signed)
Labs

## 2012-10-20 ENCOUNTER — Other Ambulatory Visit (INDEPENDENT_AMBULATORY_CARE_PROVIDER_SITE_OTHER): Payer: Managed Care, Other (non HMO)

## 2012-10-20 DIAGNOSIS — R61 Generalized hyperhidrosis: Secondary | ICD-10-CM

## 2012-10-20 DIAGNOSIS — E119 Type 2 diabetes mellitus without complications: Secondary | ICD-10-CM

## 2012-10-20 DIAGNOSIS — I10 Essential (primary) hypertension: Secondary | ICD-10-CM

## 2012-10-20 DIAGNOSIS — Z566 Other physical and mental strain related to work: Secondary | ICD-10-CM

## 2012-10-20 DIAGNOSIS — Z569 Unspecified problems related to employment: Secondary | ICD-10-CM

## 2012-10-20 DIAGNOSIS — N400 Enlarged prostate without lower urinary tract symptoms: Secondary | ICD-10-CM

## 2012-10-20 LAB — LIPID PANEL
Total CHOL/HDL Ratio: 5
VLDL: 18 mg/dL (ref 0.0–40.0)

## 2012-10-20 LAB — HEMOGLOBIN A1C: Hgb A1c MFr Bld: 6.6 % — ABNORMAL HIGH (ref 4.6–6.5)

## 2012-10-20 LAB — BASIC METABOLIC PANEL
BUN: 16 mg/dL (ref 6–23)
CO2: 28 mEq/L (ref 19–32)
Calcium: 9.4 mg/dL (ref 8.4–10.5)
Chloride: 103 mEq/L (ref 96–112)
Creatinine, Ser: 1.1 mg/dL (ref 0.4–1.5)
Glucose, Bld: 113 mg/dL — ABNORMAL HIGH (ref 70–99)

## 2012-10-20 LAB — TSH: TSH: 1.6 u[IU]/mL (ref 0.35–5.50)

## 2012-10-20 LAB — SEDIMENTATION RATE: Sed Rate: 5 mm/hr (ref 0–22)

## 2012-10-20 LAB — HEPATIC FUNCTION PANEL
Albumin: 4.2 g/dL (ref 3.5–5.2)
Alkaline Phosphatase: 89 U/L (ref 39–117)
Total Protein: 6.9 g/dL (ref 6.0–8.3)

## 2012-10-21 ENCOUNTER — Ambulatory Visit (INDEPENDENT_AMBULATORY_CARE_PROVIDER_SITE_OTHER): Payer: Managed Care, Other (non HMO) | Admitting: Psychology

## 2012-10-21 DIAGNOSIS — F411 Generalized anxiety disorder: Secondary | ICD-10-CM

## 2012-10-26 ENCOUNTER — Ambulatory Visit: Payer: Managed Care, Other (non HMO) | Admitting: Psychology

## 2012-11-03 ENCOUNTER — Ambulatory Visit: Payer: Managed Care, Other (non HMO) | Admitting: Psychology

## 2012-12-01 ENCOUNTER — Ambulatory Visit (INDEPENDENT_AMBULATORY_CARE_PROVIDER_SITE_OTHER): Payer: BC Managed Care – PPO | Admitting: Internal Medicine

## 2012-12-01 ENCOUNTER — Encounter: Payer: Self-pay | Admitting: Internal Medicine

## 2012-12-01 ENCOUNTER — Telehealth: Payer: Self-pay | Admitting: Internal Medicine

## 2012-12-01 VITALS — BP 140/92 | HR 80 | Temp 97.0°F | Resp 16 | Wt 193.0 lb

## 2012-12-01 DIAGNOSIS — N400 Enlarged prostate without lower urinary tract symptoms: Secondary | ICD-10-CM

## 2012-12-01 DIAGNOSIS — N320 Bladder-neck obstruction: Secondary | ICD-10-CM

## 2012-12-01 DIAGNOSIS — E119 Type 2 diabetes mellitus without complications: Secondary | ICD-10-CM

## 2012-12-01 DIAGNOSIS — I10 Essential (primary) hypertension: Secondary | ICD-10-CM

## 2012-12-01 MED ORDER — FINASTERIDE 5 MG PO TABS: 5.0000 mg | ORAL_TABLET | Freq: Every day | ORAL | Status: DC

## 2012-12-01 MED ORDER — CARVEDILOL 25 MG PO TABS: 25.0000 mg | ORAL_TABLET | Freq: Two times a day (BID) | ORAL | Status: DC

## 2012-12-01 NOTE — Assessment & Plan Note (Signed)
CBG PSA

## 2012-12-01 NOTE — Telephone Encounter (Signed)
Patient calling, he was just in the office and left a script at the check on counter.  Please send to [employer], phone number [phone].   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

## 2012-12-01 NOTE — Progress Notes (Signed)
   Subjective:     HPI F/u indigestion and nausea.Crestor seems to bother - he stopped it   C/o urinary urgency - worse x 1 mo C/o elev BP   Review of Systems  Constitutional: Negative for appetite change, fatigue and unexpected weight change.  HENT: Negative for nosebleeds, congestion, sore throat, sneezing, trouble swallowing and neck pain.   Eyes: Negative for itching and visual disturbance.  Cardiovascular: Negative for chest pain, palpitations and leg swelling.  Gastrointestinal: Positive for nausea. Negative for vomiting, abdominal pain, diarrhea, constipation, blood in stool and abdominal distention.       ?dysphagia  Genitourinary: Negative for frequency and hematuria.  Musculoskeletal: Negative for back pain, joint swelling and gait problem.  Skin: Negative for rash.  Neurological: Negative for dizziness, tremors, speech difficulty and weakness.  Psychiatric/Behavioral: Negative for suicidal ideas, disturbed wake/sleep cycle, dysphoric mood and agitation. The patient is not nervous/anxious.        Not homicidal   BP 140/92  Pulse 80  Temp 97 F (36.1 C) (Oral)  Resp 16  Wt 193 lb (87.544 kg)     Objective:   Physical Exam  Constitutional: He is oriented to person, place, and time. He appears well-developed and well-nourished. No distress.  HENT:  Mouth/Throat: Oropharynx is clear and moist.  Eyes: Conjunctivae normal are normal. Pupils are equal, round, and reactive to light.  Neck: Normal range of motion. No JVD present. No thyromegaly present.  Cardiovascular: Normal rate, regular rhythm, normal heart sounds and intact distal pulses.  Exam reveals no gallop and no friction rub.   No murmur heard. Pulmonary/Chest: Effort normal and breath sounds normal. No respiratory distress. He has no wheezes. He has no rales. He exhibits no tenderness.  Abdominal: Soft. Bowel sounds are normal. He exhibits no distension and no mass. There is tenderness (mild in epig area).  There is no rebound and no guarding.  Musculoskeletal: Normal range of motion. He exhibits no edema and no tenderness.  Lymphadenopathy:    He has no cervical adenopathy.  Neurological: He is alert and oriented to person, place, and time. He has normal reflexes. No cranial nerve deficit. He exhibits normal muscle tone. Coordination normal.  Skin: Skin is warm and dry. No rash noted.  Psychiatric: He has a normal mood and affect. His behavior is normal. Judgment and thought content normal.        Lab Results  Component Value Date   WBC 6.3 11/13/2011   HGB 15.8 11/13/2011   HCT 46.7 11/13/2011   PLT 134.0* 11/13/2011   GLUCOSE 101* 11/13/2011   CHOL 238* 11/13/2011   TRIG 146.0 11/13/2011   HDL 53.40 11/13/2011   LDLDIRECT 151.2 11/13/2011   LDLCALC 74 06/01/2009   ALT 40 11/13/2011   AST 30 11/13/2011   NA 143 11/13/2011   K 4.1 11/13/2011   CL 106 11/13/2011   CREATININE 1.2 11/13/2011   BUN 17 11/13/2011   CO2 29 11/13/2011   TSH 1.09 07/03/2011   PSA 0.63 07/03/2011   HGBA1C 6.5 11/13/2011   MICROALBUR 0.7 11/21/2008           Assessment & Plan:

## 2012-12-01 NOTE — Assessment & Plan Note (Signed)
Start Proscar. 

## 2012-12-01 NOTE — Assessment & Plan Note (Signed)
Will increase Coreg to 25 mg b.i.d..

## 2012-12-01 NOTE — Assessment & Plan Note (Signed)
Labs

## 2012-12-22 ENCOUNTER — Ambulatory Visit: Payer: BC Managed Care – PPO | Admitting: Psychology

## 2012-12-24 ENCOUNTER — Ambulatory Visit: Payer: BC Managed Care – PPO | Admitting: Internal Medicine

## 2012-12-29 ENCOUNTER — Encounter: Payer: Self-pay | Admitting: Internal Medicine

## 2012-12-29 ENCOUNTER — Ambulatory Visit (INDEPENDENT_AMBULATORY_CARE_PROVIDER_SITE_OTHER): Payer: BC Managed Care – PPO | Admitting: Internal Medicine

## 2012-12-29 VITALS — BP 140/86 | HR 88 | Ht 69.0 in | Wt 196.0 lb

## 2012-12-29 DIAGNOSIS — R0989 Other specified symptoms and signs involving the circulatory and respiratory systems: Secondary | ICD-10-CM

## 2012-12-29 DIAGNOSIS — R1310 Dysphagia, unspecified: Secondary | ICD-10-CM

## 2012-12-29 DIAGNOSIS — F458 Other somatoform disorders: Secondary | ICD-10-CM

## 2012-12-29 NOTE — Progress Notes (Signed)
  Subjective:    Patient ID: Jacob Gordon, male    DOB: 1956-05-26, 57 y.o.   MRN: [id number]  HPI The patient is here with complaints of globus and vague dysphagia again. He says the esophageal dilation performed 1 year ago provided 3 months of relief. He is having a constant lump in his throat now and some mild sense of fodd going down slowly. Still has occasional lower abdominal pain and had an episode of LLQ pain and loose stools with nausea and vomiting that was self-limited at the end of January. Lasted 24-36 hrs. No GU sxs but pain was as bad as prior kidney stone.  Medications, allergies, past medical history, past surgical history, family history and social history are reviewed and updated in the EMR.   Review of Systems Having work-related stress Had insomnia - better now not using nortriptyline    Objective:   Physical Exam NAD Pharynx is clear Neck supple without mass or thyromegaly  Ba swallow 2008 NL EGD 12/2010 normal     Assessment & Plan:  Globus sensation - Plan: esophageal manometry - also to try clonazepam  Dysphagia, unspecified - Plan: esophageal manometry

## 2012-12-29 NOTE — Patient Instructions (Addendum)
You have been scheduled for an esophageal manometry at [facility] on 01/11/13 at 9:30am. Please arrive 30 minutes prior to your procedure for registration. You will need to go to outpatient registration (1st floor of the hospital) first. Make certain to bring your insurance cards as well as a complete list of medications.  Please remember the following:  1) Nothing to eat or drink after 12:00 midnight on the night before your test.  2) Hold all diabetic medications/insulin the morning of the test. You may eat and take your medications after the test.  3) For 3 days prior to your test do not take: Dexilant, Prevacid, Nexium, Protonix,  Aciphex, Zegerid, Pantoprazole, Prilosec or omeprazole.  4) For 2 days prior to your test, do not take: Reglan, Tagamet, Zantac, Axid or Pepcid.  5) You MAY use an antacid such as Rolaids or Tums up to 12 hours prior to your test.  It will take at least 2 weeks to receive the results of this test from your physician. ------------------------------------------ ABOUT ESOPHAGEAL MANOMETRY Esophageal manometry (muh-NOM-uh-tree) is a test that gauges how well your esophagus works. Your esophagus is the long, muscular tube that connects your throat to your stomach. Esophageal manometry measures the rhythmic muscle contractions (peristalsis) that occur in your esophagus when you swallow. Esophageal manometry also measures the coordination and force exerted by the muscles of your esophagus.  During esophageal manometry, a thin, flexible tube (catheter) that contains sensors is passed through your nose, down your esophagus and into your stomach. Esophageal manometry can be helpful in diagnosing some mostly uncommon disorders that affect your esophagus.  Why it's done Esophageal manometry is used to evaluate the movement (motility) of food through the esophagus and into the stomach. The test measures how well the circular bands of muscle (sphincters) at the top and  bottom of your esophagus open and close, as well as the pressure, strength and pattern of the wave of esophageal muscle contractions that moves food along.  What you can expect Esophageal manometry is an outpatient procedure done without sedation. Most people tolerate it well. You may be asked to change into a hospital gown before the test starts.  During esophageal manometry  While you are sitting up, a member of your health care team sprays your throat with a numbing medication or puts numbing gel in your nose or both.  A catheter is guided through your nose into your esophagus. The catheter may be sheathed in a water-filled sleeve. It doesn't interfere with your breathing. However, your eyes may water, and you may gag. You may have a slight nosebleed from irritation.  After the catheter is in place, you may be asked to lie on your back on an exam table, or you may be asked to remain seated.  You then swallow small sips of water. As you do, a computer connected to the catheter records the pressure, strength and pattern of your esophageal muscle contractions.  During the test, you'll be asked to breathe slowly and smoothly, remain as still as possible, and swallow only when you're asked to do so.  A member of your health care team may move the catheter down into your stomach while the catheter continues its measurements.  The catheter then is slowly withdrawn. The test usually lasts 20 to 30 minutes.  After esophageal manometry  When your esophageal manometry is complete, you may return to your normal activities  This test typically takes 30-45 minutes to complete.  ________________________________________________________________________________  Take your clonazepam on a regular basis and see if this with help your trouble swallowing.  Thank you for choosing me and [facility].  Iva Boop, M.D., [facility]

## 2012-12-30 ENCOUNTER — Encounter: Payer: Self-pay | Admitting: Internal Medicine

## 2013-01-11 ENCOUNTER — Ambulatory Visit (HOSPITAL_COMMUNITY)
Admission: RE | Admit: 2013-01-11 | Payer: BC Managed Care – PPO | Source: Ambulatory Visit | Admitting: Internal Medicine

## 2013-01-11 ENCOUNTER — Encounter (HOSPITAL_COMMUNITY): Admission: RE | Payer: Self-pay | Source: Ambulatory Visit

## 2013-01-11 SURGERY — MANOMETRY, ESOPHAGUS

## 2013-02-25 ENCOUNTER — Other Ambulatory Visit: Payer: Self-pay | Admitting: Internal Medicine

## 2013-03-12 ENCOUNTER — Ambulatory Visit: Payer: BC Managed Care – PPO | Admitting: Internal Medicine

## 2013-03-16 ENCOUNTER — Other Ambulatory Visit: Payer: Self-pay | Admitting: Internal Medicine

## 2013-03-19 ENCOUNTER — Other Ambulatory Visit (INDEPENDENT_AMBULATORY_CARE_PROVIDER_SITE_OTHER): Payer: BC Managed Care – PPO

## 2013-03-19 ENCOUNTER — Encounter: Payer: Self-pay | Admitting: Internal Medicine

## 2013-03-19 ENCOUNTER — Ambulatory Visit (INDEPENDENT_AMBULATORY_CARE_PROVIDER_SITE_OTHER): Payer: BC Managed Care – PPO | Admitting: Internal Medicine

## 2013-03-19 VITALS — BP 130/90 | HR 80 | Temp 98.3°F | Resp 16 | Wt 199.0 lb

## 2013-03-19 DIAGNOSIS — M2550 Pain in unspecified joint: Secondary | ICD-10-CM

## 2013-03-19 DIAGNOSIS — E119 Type 2 diabetes mellitus without complications: Secondary | ICD-10-CM

## 2013-03-19 DIAGNOSIS — I10 Essential (primary) hypertension: Secondary | ICD-10-CM

## 2013-03-19 DIAGNOSIS — M19019 Primary osteoarthritis, unspecified shoulder: Secondary | ICD-10-CM

## 2013-03-19 LAB — BASIC METABOLIC PANEL
Calcium: 9.3 mg/dL (ref 8.4–10.5)
Creatinine, Ser: 1.4 mg/dL (ref 0.4–1.5)
GFR: 69.62 mL/min (ref >60.00)

## 2013-03-19 LAB — HEMOGLOBIN A1C: Hgb A1c MFr Bld: 6.3 % (ref 4.6–6.5)

## 2013-03-19 MED ORDER — TADALAFIL 5 MG PO TABS: 5.0000 mg | ORAL_TABLET | Freq: Every day | ORAL | Status: DC | PRN

## 2013-03-19 NOTE — Progress Notes (Signed)
Subjective:     HPI  C/o B shoulder pain/OA; elbow pain; knee OA- Dr Ave Filter is seeing him for shoulders and Dr Thomasena Edis and [facility] for knees. He has been having a lot of OA issues; c/o neck and LB pains. He is applying for disability  F/u indigestion and nausea.Crestor seems to bother - he stopped it.  C/o urinary urgency, nocturia - better x 1 mo. C/o ED - worse on Proscar.  C/o elev BP   Review of Systems  Constitutional: Negative for appetite change, fatigue and unexpected weight change.  HENT: Negative for nosebleeds, congestion, sore throat, sneezing, trouble swallowing and neck pain.   Eyes: Negative for itching and visual disturbance.  Cardiovascular: Negative for chest pain, palpitations and leg swelling.  Gastrointestinal: Positive for nausea. Negative for vomiting, abdominal pain, diarrhea, constipation, blood in stool and abdominal distention.       ?dysphagia  Genitourinary: Negative for frequency and hematuria.  Musculoskeletal: Negative for back pain, joint swelling and gait problem.  Skin: Negative for rash.  Neurological: Negative for dizziness, tremors, speech difficulty and weakness.  Psychiatric/Behavioral: Negative for suicidal ideas, disturbed wake/sleep cycle, dysphoric mood and agitation. The patient is not nervous/anxious.        Not homicidal   BP 130/90  Pulse 80  Temp(Src) 98.3 F (36.8 C) (Oral)  Resp 16  Wt 199 lb (90.266 kg)  BMI 29.37 kg/m2     Objective:   Physical Exam  Constitutional: He is oriented to person, place, and time. He appears well-developed and well-nourished. No distress.  HENT:  Mouth/Throat: Oropharynx is clear and moist.  Eyes: Conjunctivae normal are normal. Pupils are equal, round, and reactive to light.  Neck: Normal range of motion. No JVD present. No thyromegaly present.  Cardiovascular: Normal rate, regular rhythm, normal heart sounds and intact distal pulses.  Exam reveals no gallop and no friction rub.   No  murmur heard. Pulmonary/Chest: Effort normal and breath sounds normal. No respiratory distress. He has no wheezes. He has no rales. He exhibits no tenderness.  Abdominal: Soft. Bowel sounds are normal. He exhibits no distension and no mass. There is tenderness (mild in epig area). There is no rebound and no guarding.  Musculoskeletal: Normal range of motion. He exhibits no edema and no tenderness.  Lymphadenopathy:    He has no cervical adenopathy.  Neurological: He is alert and oriented to person, place, and time. He has normal reflexes. No cranial nerve deficit. He exhibits normal muscle tone. Coordination normal.  Skin: Skin is warm and dry. No rash noted.  Psychiatric: He has a normal mood and affect. His behavior is normal. Judgment and thought content normal.  Neck is stiff, tender w/ROM B shoulders are stiff, tender w/ROM B knees are tender w/ROM LS is tender w/ROM        Lab Results  Component Value Date   WBC 6.3 11/13/2011   HGB 15.8 11/13/2011   HCT 46.7 11/13/2011   PLT 134.0* 11/13/2011   GLUCOSE 101* 11/13/2011   CHOL 238* 11/13/2011   TRIG 146.0 11/13/2011   HDL 53.40 11/13/2011   LDLDIRECT 151.2 11/13/2011   LDLCALC 74 06/01/2009   ALT 40 11/13/2011   AST 30 11/13/2011   NA 143 11/13/2011   K 4.1 11/13/2011   CL 106 11/13/2011   CREATININE 1.2 11/13/2011   BUN 17 11/13/2011   CO2 29 11/13/2011   TSH 1.09 07/03/2011   PSA 0.63 07/03/2011   HGBA1C 6.5 11/13/2011  MICROALBUR 0.7 11/21/2008           Assessment & Plan:

## 2013-03-19 NOTE — Assessment & Plan Note (Signed)
Continue with current prescription therapy as reflected on the Med list. BP Readings from Last 3 Encounters:  03/19/13 130/90  12/29/12 140/86  12/01/12 140/92

## 2013-03-19 NOTE — Assessment & Plan Note (Addendum)
Continue with current prescription therapy as reflected on the Med list. Wt Readings from Last 3 Encounters:  03/19/13 199 lb (90.266 kg)  12/29/12 196 lb (88.905 kg)  12/01/12 193 lb (87.544 kg)

## 2013-03-22 NOTE — Assessment & Plan Note (Addendum)
Chronic, OA related - severe pains He is planning to apply for disability

## 2013-03-22 NOTE — Assessment & Plan Note (Signed)
B (managed per ortho)

## 2013-03-23 DIAGNOSIS — Z0279 Encounter for issue of other medical certificate: Secondary | ICD-10-CM

## 2013-03-30 ENCOUNTER — Other Ambulatory Visit: Payer: Self-pay | Admitting: Internal Medicine

## 2013-03-30 ENCOUNTER — Ambulatory Visit: Payer: BC Managed Care – PPO | Admitting: Internal Medicine

## 2013-03-30 ENCOUNTER — Telehealth: Payer: Self-pay | Admitting: *Deleted

## 2013-03-30 NOTE — Telephone Encounter (Signed)
Left msg on triage stating md rx cialis. Pharmacy inform them med needed prior authorization. Requesting PA ASAP...lmb

## 2013-04-05 ENCOUNTER — Other Ambulatory Visit: Payer: Self-pay | Admitting: Internal Medicine

## 2013-04-05 ENCOUNTER — Encounter: Payer: Self-pay | Admitting: Internal Medicine

## 2013-04-05 MED ORDER — AMLODIPINE BESYLATE 10 MG PO TABS: 10.0000 mg | ORAL_TABLET | Freq: Every day | ORAL | Status: DC

## 2013-06-13 ENCOUNTER — Other Ambulatory Visit: Payer: Self-pay | Admitting: Internal Medicine

## 2013-06-16 ENCOUNTER — Other Ambulatory Visit: Payer: Self-pay | Admitting: Internal Medicine

## 2013-06-17 ENCOUNTER — Telehealth: Payer: Self-pay | Admitting: Internal Medicine

## 2013-06-17 NOTE — Telephone Encounter (Signed)
Pt wants a call about the ciallis refill.  He wants it for the dx of frequent urination.  He has sent my chart or e-mail messages with no response.

## 2013-06-21 MED ORDER — TADALAFIL 5 MG PO TABS: 5.0000 mg | ORAL_TABLET | Freq: Every day | ORAL | Status: DC

## 2013-06-21 NOTE — Telephone Encounter (Signed)
Ok Done Thx 

## 2013-06-21 NOTE — Telephone Encounter (Signed)
Pt is aware to check with his pharmacy.

## 2013-06-28 ENCOUNTER — Telehealth: Payer: Self-pay | Admitting: *Deleted

## 2013-06-28 NOTE — Telephone Encounter (Signed)
Jacob Gordon called requesting status of Cialis Rx.  I Spoke with pt and refaxed PA.

## 2013-07-01 ENCOUNTER — Telehealth: Payer: Self-pay | Admitting: *Deleted

## 2013-07-01 NOTE — Telephone Encounter (Signed)
[facility] PA is approved.

## 2013-07-10 ENCOUNTER — Other Ambulatory Visit: Payer: Self-pay | Admitting: Internal Medicine

## 2013-10-08 ENCOUNTER — Telehealth: Payer: Self-pay | Admitting: *Deleted

## 2013-10-08 NOTE — Telephone Encounter (Signed)
Cialis PA form was given to MD for completion today.

## 2013-10-08 NOTE — Telephone Encounter (Signed)
Pt called states a PA for Cialis is needed.  Please advise

## 2013-10-11 ENCOUNTER — Telehealth: Payer: Self-pay

## 2013-10-11 NOTE — Telephone Encounter (Signed)
Completed PA form faxed to [facility].

## 2013-10-11 NOTE — Telephone Encounter (Signed)
Phone call from patient's wife, [name]. She states insurance needs authorization for his Cialis. The insurance company has faxed required info to be filled out. [name] is requesting a call be placed instead of dealing with the fax. [phone] dept) fax [phone]. She states she has been speaking to [place] at [phone].

## 2013-10-13 NOTE — Telephone Encounter (Signed)
Rec fax from [facility] stating PA is approved from 10/11/13 until 10/10/14. Pharmacy informed.

## 2013-10-13 NOTE — Telephone Encounter (Signed)
Pt informed

## 2013-10-13 NOTE — Telephone Encounter (Signed)
Updated PA form faxed to [facility] at [phone]. Left detailed mess informing Dawn with [employer] the pt's AUA-SI score prior to therapy was 12 and it is now 9.

## 2013-10-13 NOTE — Telephone Encounter (Signed)
pts wife called again states they have not received the PA.  Please refax

## 2013-10-30 ENCOUNTER — Other Ambulatory Visit: Payer: Self-pay | Admitting: Internal Medicine

## 2013-12-13 ENCOUNTER — Telehealth: Payer: Self-pay | Admitting: Endocrinology

## 2013-12-13 NOTE — Telephone Encounter (Signed)
Rec'd from [facility] forward 7 pages to Dr.Ellsion

## 2013-12-14 ENCOUNTER — Other Ambulatory Visit: Payer: Self-pay | Admitting: Internal Medicine

## 2014-01-03 ENCOUNTER — Other Ambulatory Visit: Payer: Self-pay | Admitting: Internal Medicine

## 2014-01-19 DIAGNOSIS — M722 Plantar fascial fibromatosis: Secondary | ICD-10-CM

## 2014-04-14 ENCOUNTER — Other Ambulatory Visit: Payer: Self-pay | Admitting: Internal Medicine

## 2014-05-02 ENCOUNTER — Ambulatory Visit (INDEPENDENT_AMBULATORY_CARE_PROVIDER_SITE_OTHER): Payer: BC Managed Care – PPO | Admitting: Podiatry

## 2014-05-02 ENCOUNTER — Ambulatory Visit: Payer: Self-pay

## 2014-05-02 ENCOUNTER — Encounter: Payer: Self-pay | Admitting: Podiatry

## 2014-05-02 ENCOUNTER — Ambulatory Visit (INDEPENDENT_AMBULATORY_CARE_PROVIDER_SITE_OTHER): Payer: BC Managed Care – PPO

## 2014-05-02 VITALS — BP 163/108 | HR 65 | Resp 16

## 2014-05-02 DIAGNOSIS — M722 Plantar fascial fibromatosis: Secondary | ICD-10-CM

## 2014-05-02 DIAGNOSIS — R52 Pain, unspecified: Secondary | ICD-10-CM

## 2014-05-02 DIAGNOSIS — M779 Enthesopathy, unspecified: Secondary | ICD-10-CM

## 2014-05-02 MED ORDER — TRIAMCINOLONE ACETONIDE 10 MG/ML IJ SUSP
10.0000 mg | Freq: Once | INTRAMUSCULAR | Status: AC
  Administered 2014-05-02: 10 mg

## 2014-05-02 NOTE — Patient Instructions (Signed)

## 2014-05-02 NOTE — Progress Notes (Signed)
   Subjective:    Patient ID: Jacob Gordon, male    DOB: [birth year], 58 y.o.   MRN: [id number]  HPI  Pt states that has pain in both feet, has a h/o of plantar fasciitis. New pain in right foot at 5th met , pain in left foot is heel pain, both feet are in constant pain and worsens with walking. Has noticed pain is unbearable for the past 8 days, he has compensated with his walking.  Review of Systems  Gastrointestinal: Positive for diarrhea.  Endocrine: Positive for polyuria.  Genitourinary: Positive for urgency.  Musculoskeletal: Positive for back pain and gait problem.  All other systems reviewed and are negative.      Objective:   Physical Exam        Assessment & Plan:

## 2014-05-02 NOTE — Progress Notes (Signed)
Subjective:     Patient ID: Jacob Gordon, male   DOB: [birth year], 58 y.o.   MRN: [id number]  HPI patient states that the bottom of my left heel has been killing me for the last few weeks and I developed pain in the top of my right foot around this joint pointing to the fourth stating it's been very tender   Review of Systems     Objective:   Physical Exam Neurovascular status is intact with inflammation and pain left plantar heel at the insertion of the fascia into the calcaneus. Tenderness and pain fourth MPJ right with inflammation noted    Assessment:     Plantar fasciitis of the left heel acute in nature and capsulitis right fourth MPJ    Plan:     Injected the left plantar fascia 3 mg Kenalog 5 mg Xylocaine Marcaine mixture and did a proximal block of the right fourth MPJ and then aspirated the joint giving out a small amount of clear fluid and then injected with half cc of dexamethasone Kenalog and applied a pad

## 2014-06-19 ENCOUNTER — Other Ambulatory Visit: Payer: Self-pay | Admitting: Internal Medicine

## 2014-08-28 ENCOUNTER — Other Ambulatory Visit: Payer: Self-pay | Admitting: Internal Medicine

## 2014-10-09 ENCOUNTER — Other Ambulatory Visit: Payer: Self-pay | Admitting: Internal Medicine

## 2014-10-14 ENCOUNTER — Telehealth: Payer: Self-pay | Admitting: Internal Medicine

## 2014-10-14 MED ORDER — TADALAFIL 5 MG PO TABS: 5.0000 mg | ORAL_TABLET | Freq: Every day | ORAL | Status: DC

## 2014-10-14 NOTE — Telephone Encounter (Signed)
Pt request refill for Cialis to be call into [facility]. Pt has an appt 12/15/115, please call it in bc he is out of this med.

## 2014-10-18 ENCOUNTER — Ambulatory Visit (INDEPENDENT_AMBULATORY_CARE_PROVIDER_SITE_OTHER): Payer: BC Managed Care – PPO | Admitting: Internal Medicine

## 2014-10-18 ENCOUNTER — Encounter: Payer: Self-pay | Admitting: Internal Medicine

## 2014-10-18 VITALS — BP 138/90 | HR 72 | Temp 98.3°F | Wt 190.0 lb

## 2014-10-18 DIAGNOSIS — I10 Essential (primary) hypertension: Secondary | ICD-10-CM

## 2014-10-18 DIAGNOSIS — M2550 Pain in unspecified joint: Secondary | ICD-10-CM

## 2014-10-18 DIAGNOSIS — E119 Type 2 diabetes mellitus without complications: Secondary | ICD-10-CM

## 2014-10-18 MED ORDER — TADALAFIL 5 MG PO TABS: 5.0000 mg | ORAL_TABLET | Freq: Every day | ORAL | Status: DC

## 2014-10-18 NOTE — Progress Notes (Signed)
   Subjective:     HPI    F/u indigestion and nausea.Crestor seems to bother - he stopped it.  C/o urinary urgency, nocturia - better x 1 mo. C/o ED - worse on Proscar.  C/o elev BP, DM, BPH/ED   Review of Systems  Constitutional: Negative for appetite change, fatigue and unexpected weight change.  HENT: Negative for nosebleeds, congestion, sore throat, sneezing, trouble swallowing and neck pain.   Eyes: Negative for itching and visual disturbance.  Cardiovascular: Negative for chest pain, palpitations and leg swelling.  Gastrointestinal: Positive for nausea. Negative for vomiting, abdominal pain, diarrhea, constipation, blood in stool and abdominal distention.       ?dysphagia  Genitourinary: Negative for frequency and hematuria.  Musculoskeletal: Negative for back pain, joint swelling and gait problem.  Skin: Negative for rash.  Neurological: Negative for dizziness, tremors, speech difficulty and weakness.  Psychiatric/Behavioral: Negative for suicidal ideas, disturbed wake/sleep cycle, dysphoric mood and agitation. The patient is not nervous/anxious.        Not homicidal   BP 138/90 mmHg  Pulse 72  Temp(Src) 98.3 F (36.8 C) (Oral)  Wt 190 lb (86.183 kg)  SpO2 95%     Objective:   Physical Exam  Constitutional: He is oriented to person, place, and time. He appears well-developed and well-nourished. No distress.  HENT:  Mouth/Throat: Oropharynx is clear and moist.  Eyes: Conjunctivae normal are normal. Pupils are equal, round, and reactive to light.  Neck: Normal range of motion. No JVD present. No thyromegaly present.  Cardiovascular: Normal rate, regular rhythm, normal heart sounds and intact distal pulses.  Exam reveals no gallop and no friction rub.   No murmur heard. Pulmonary/Chest: Effort normal and breath sounds normal. No respiratory distress. He has no wheezes. He has no rales. He exhibits no tenderness.  Abdominal: Soft. Bowel sounds are normal. He  exhibits no distension and no mass. There is tenderness (mild in epig area). There is no rebound and no guarding.  Musculoskeletal: Normal range of motion. He exhibits no edema and no tenderness.  Lymphadenopathy:    He has no cervical adenopathy.  Neurological: He is alert and oriented to person, place, and time. He has normal reflexes. No cranial nerve deficit. He exhibits normal muscle tone. Coordination normal.  Skin: Skin is warm and dry. No rash noted.  Psychiatric: He has a normal mood and affect. His behavior is normal. Judgment and thought content normal.  Neck is stiff, tender w/ROM B shoulders are stiff, tender w/ROM B knees are tender w/ROM LS is tender w/ROM        Lab Results  Component Value Date   WBC 6.3 11/13/2011   HGB 15.8 11/13/2011   HCT 46.7 11/13/2011   PLT 134.0* 11/13/2011   GLUCOSE 101* 11/13/2011   CHOL 238* 11/13/2011   TRIG 146.0 11/13/2011   HDL 53.40 11/13/2011   LDLDIRECT 151.2 11/13/2011   LDLCALC 74 06/01/2009   ALT 40 11/13/2011   AST 30 11/13/2011   NA 143 11/13/2011   K 4.1 11/13/2011   CL 106 11/13/2011   CREATININE 1.2 11/13/2011   BUN 17 11/13/2011   CO2 29 11/13/2011   TSH 1.09 07/03/2011   PSA 0.63 07/03/2011   HGBA1C 6.5 11/13/2011   MICROALBUR 0.7 11/21/2008           Assessment & Plan:

## 2014-10-18 NOTE — Assessment & Plan Note (Signed)
Labs

## 2014-10-18 NOTE — Assessment & Plan Note (Signed)
Chronic. 

## 2014-10-18 NOTE — Progress Notes (Signed)
Pre visit review using our clinic review tool, if applicable. No additional management support is needed unless otherwise documented below in the visit note. 

## 2014-10-18 NOTE — Assessment & Plan Note (Signed)
Continue with current prescription therapy as reflected on the Med list.  

## 2014-10-19 ENCOUNTER — Telehealth: Payer: Self-pay | Admitting: Internal Medicine

## 2014-10-19 NOTE — Telephone Encounter (Signed)
emmi emailed °

## 2014-10-20 ENCOUNTER — Other Ambulatory Visit (INDEPENDENT_AMBULATORY_CARE_PROVIDER_SITE_OTHER): Payer: BC Managed Care – PPO

## 2014-10-20 ENCOUNTER — Other Ambulatory Visit: Payer: Self-pay | Admitting: Internal Medicine

## 2014-10-20 DIAGNOSIS — M2550 Pain in unspecified joint: Secondary | ICD-10-CM

## 2014-10-20 DIAGNOSIS — I10 Essential (primary) hypertension: Secondary | ICD-10-CM

## 2014-10-20 DIAGNOSIS — E119 Type 2 diabetes mellitus without complications: Secondary | ICD-10-CM

## 2014-10-20 LAB — CBC WITH DIFFERENTIAL/PLATELET
Basophils Absolute: 0 10*3/uL (ref 0.0–0.1)
Basophils Relative: 0.5 % (ref 0.0–3.0)
Eosinophils Absolute: 0.1 10*3/uL (ref 0.0–0.7)
Eosinophils Relative: 1.2 % (ref 0.0–5.0)
HCT: 51.4 % (ref 39.0–52.0)
Hemoglobin: 16.8 g/dL (ref 13.0–17.0)
Lymphocytes Relative: 24.5 % (ref 12.0–46.0)
Lymphs Abs: 1.3 10*3/uL (ref 0.7–4.0)
MCHC: 32.7 g/dL (ref 30.0–36.0)
MCV: 96.3 fl (ref 78.0–100.0)
Monocytes Absolute: 0.6 10*3/uL (ref 0.1–1.0)
Monocytes Relative: 11.9 % (ref 3.0–12.0)
Neutro Abs: 3.3 10*3/uL (ref 1.4–7.7)
Neutrophils Relative %: 61.9 % (ref 43.0–77.0)
Platelets: 133 10*3/uL — ABNORMAL LOW (ref 150.0–400.0)
RBC: 5.34 Mil/uL (ref 4.22–5.81)
RDW: 14.6 % (ref 11.5–15.5)
WBC: 5.4 10*3/uL (ref 4.0–10.5)

## 2014-10-20 LAB — HEPATIC FUNCTION PANEL
ALT: 31 U/L (ref 0–53)
AST: 27 U/L (ref 0–37)
Albumin: 4.3 g/dL (ref 3.5–5.2)
Alkaline Phosphatase: 79 U/L (ref 39–117)
BILIRUBIN DIRECT: 0.1 mg/dL (ref 0.0–0.3)
BILIRUBIN TOTAL: 1.2 mg/dL (ref 0.2–1.2)
Total Protein: 6.9 g/dL (ref 6.0–8.3)

## 2014-10-20 LAB — PSA: PSA: 1 ng/mL (ref 0.10–4.00)

## 2014-10-20 LAB — SEDIMENTATION RATE: Sed Rate: 0 mm/hr (ref 0–22)

## 2014-10-20 LAB — URIC ACID: Uric Acid, Serum: 5.5 mg/dL (ref 4.0–7.8)

## 2014-10-20 LAB — LIPID PANEL
Cholesterol: 253 mg/dL — ABNORMAL HIGH (ref 0–200)
HDL: 55.6 mg/dL (ref >39.00)
LDL CALC: 184 mg/dL — AB (ref 0–99)
NonHDL: 197.4
TRIGLYCERIDES: 67 mg/dL (ref 0.0–149.0)
Total CHOL/HDL Ratio: 5
VLDL: 13.4 mg/dL (ref 0.0–40.0)

## 2014-10-20 LAB — TSH: TSH: 1.11 u[IU]/mL (ref 0.35–4.50)

## 2014-10-20 LAB — BASIC METABOLIC PANEL
BUN: 14 mg/dL (ref 6–23)
CO2: 27 mEq/L (ref 19–32)
Calcium: 9.5 mg/dL (ref 8.4–10.5)
Chloride: 106 mEq/L (ref 96–112)
Creatinine, Ser: 1.3 mg/dL (ref 0.4–1.5)
GFR: 75.61 mL/min (ref >60.00)
Glucose, Bld: 113 mg/dL — ABNORMAL HIGH (ref 70–99)
Potassium: 4.6 mEq/L (ref 3.5–5.1)
Sodium: 140 mEq/L (ref 135–145)

## 2014-10-20 LAB — VITAMIN D 25 HYDROXY (VIT D DEFICIENCY, FRACTURES): VITD: 15.12 ng/mL — ABNORMAL LOW (ref 30.00–100.00)

## 2014-10-20 LAB — HEMOGLOBIN A1C: Hgb A1c MFr Bld: 6.5 % (ref 4.6–6.5)

## 2014-10-20 LAB — VITAMIN B12: Vitamin B-12: 286 pg/mL (ref 211–911)

## 2014-10-20 LAB — RHEUMATOID FACTOR

## 2014-10-20 MED ORDER — VITAMIN B-12 1000 MCG SL SUBL: 1.0000 | SUBLINGUAL_TABLET | Freq: Every day | SUBLINGUAL | Status: DC

## 2014-10-20 MED ORDER — ERGOCALCIFEROL 1.25 MG (50000 UT) PO CAPS: 50000.0000 [IU] | ORAL_CAPSULE | ORAL | Status: DC

## 2014-10-20 MED ORDER — VITAMIN D 1000 UNITS PO TABS: 1000.0000 [IU] | ORAL_TABLET | Freq: Every day | ORAL | Status: AC

## 2014-10-21 ENCOUNTER — Telehealth: Payer: Self-pay | Admitting: Internal Medicine

## 2014-10-21 NOTE — Telephone Encounter (Signed)
Pt's spouse request to know if PA is done with Cialis. Please call spouse back today because the drug store request this to be done since Monday. Pt's spouse would like to know the date and time if we already faxed it to the insurance.

## 2014-10-25 ENCOUNTER — Other Ambulatory Visit: Payer: Self-pay | Admitting: Internal Medicine

## 2014-10-25 NOTE — Telephone Encounter (Signed)
[phone]. I called PA number. Completed PA form faxed back. Pt's wife informed.

## 2014-10-25 NOTE — Telephone Encounter (Signed)
Patients wife called back in regards.  She would like a call back.  She states [employer] states they have not received PA.  Wife can be reached at [phone]

## 2014-10-29 ENCOUNTER — Other Ambulatory Visit: Payer: Self-pay | Admitting: Internal Medicine

## 2014-11-23 ENCOUNTER — Telehealth: Payer: Self-pay | Admitting: Internal Medicine

## 2014-11-23 NOTE — Telephone Encounter (Signed)
[employer] prior auth department calling to let MD - cialis denied but MD can have denial overturned. This can be overturned be calling [phone] [facility].

## 2014-12-06 NOTE — Telephone Encounter (Signed)
Sounds like an appeal is needed. Pt wants #30/ month for his BPH

## 2014-12-08 NOTE — Telephone Encounter (Signed)
AUA document attained.  Called [employer] for a peer to peer appeal over the phone.   Appeal has been approved  Pt informed

## 2014-12-08 NOTE — Telephone Encounter (Signed)
This PA was submitted using the code for BPH.

## 2014-12-08 NOTE — Telephone Encounter (Signed)
Pt's wife left vm stating we submitted PA wrong with incorrect dx code.  See PA form faxed on 11/25/13. Form states need for Cialis 5 mg is prescribed for BPH. I called  Pt's insurance at number below to verify what the problem is. [employer] rep informed me that the PA was denied because pt's AUA SI score was less than 8. Please advise on what to say re: this score. I do not know how to word it properly.  We need to submit appeal info  To fax #[phone].

## 2014-12-12 ENCOUNTER — Telehealth: Payer: Self-pay | Admitting: Internal Medicine

## 2014-12-12 NOTE — Telephone Encounter (Signed)
Patient is requesting your call. He was speaking with you about a prior auth, which shows approved as of 12/08/2014. He states that he has addtl questions

## 2014-12-13 NOTE — Telephone Encounter (Signed)
Pt requesting copy of approval letter. Copy upfront for p/u. Pt informed

## 2014-12-20 ENCOUNTER — Encounter: Payer: Self-pay | Admitting: Internal Medicine

## 2015-01-04 ENCOUNTER — Other Ambulatory Visit: Payer: Self-pay | Admitting: Internal Medicine

## 2015-02-02 ENCOUNTER — Other Ambulatory Visit: Payer: Self-pay | Admitting: Internal Medicine

## 2015-03-19 ENCOUNTER — Encounter: Payer: Self-pay | Admitting: Internal Medicine

## 2015-03-24 ENCOUNTER — Ambulatory Visit: Payer: BC Managed Care – PPO | Admitting: Internal Medicine

## 2015-03-24 DIAGNOSIS — Z0289 Encounter for other administrative examinations: Secondary | ICD-10-CM

## 2015-05-01 ENCOUNTER — Other Ambulatory Visit: Payer: Self-pay

## 2015-05-17 ENCOUNTER — Other Ambulatory Visit: Payer: Self-pay | Admitting: Internal Medicine

## 2015-06-08 ENCOUNTER — Encounter: Payer: Self-pay | Admitting: Internal Medicine

## 2015-06-08 ENCOUNTER — Ambulatory Visit (INDEPENDENT_AMBULATORY_CARE_PROVIDER_SITE_OTHER): Payer: 59 | Admitting: Internal Medicine

## 2015-06-08 ENCOUNTER — Other Ambulatory Visit (INDEPENDENT_AMBULATORY_CARE_PROVIDER_SITE_OTHER): Payer: 59

## 2015-06-08 VITALS — BP 120/84 | HR 72 | Temp 98.3°F | Resp 16 | Ht 69.0 in | Wt 193.0 lb

## 2015-06-08 DIAGNOSIS — R10814 Left lower quadrant abdominal tenderness: Secondary | ICD-10-CM

## 2015-06-08 DIAGNOSIS — E119 Type 2 diabetes mellitus without complications: Secondary | ICD-10-CM

## 2015-06-08 DIAGNOSIS — I10 Essential (primary) hypertension: Secondary | ICD-10-CM | POA: Diagnosis not present

## 2015-06-08 LAB — COMPREHENSIVE METABOLIC PANEL
ALK PHOS: 77 U/L (ref 39–117)
ALT: 24 U/L (ref 0–53)
AST: 22 U/L (ref 0–37)
Albumin: 4.2 g/dL (ref 3.5–5.2)
BUN: 16 mg/dL (ref 6–23)
CO2: 29 mEq/L (ref 19–32)
CREATININE: 1.45 mg/dL (ref 0.40–1.50)
Calcium: 9.3 mg/dL (ref 8.4–10.5)
Chloride: 105 mEq/L (ref 96–112)
GFR: 64.15 mL/min (ref >60.00)
Glucose, Bld: 106 mg/dL — ABNORMAL HIGH (ref 70–99)
POTASSIUM: 3.7 meq/L (ref 3.5–5.1)
Sodium: 140 mEq/L (ref 135–145)
Total Bilirubin: 1.2 mg/dL (ref 0.2–1.2)
Total Protein: 7.1 g/dL (ref 6.0–8.3)

## 2015-06-08 LAB — CBC WITH DIFFERENTIAL/PLATELET
BASOS PCT: 0.5 % (ref 0.0–3.0)
Basophils Absolute: 0 10*3/uL (ref 0.0–0.1)
EOS ABS: 0.1 10*3/uL (ref 0.0–0.7)
Eosinophils Relative: 1.5 % (ref 0.0–5.0)
HEMATOCRIT: 47.7 % (ref 39.0–52.0)
HEMOGLOBIN: 15.9 g/dL (ref 13.0–17.0)
Lymphocytes Relative: 23.5 % (ref 12.0–46.0)
Lymphs Abs: 1.7 10*3/uL (ref 0.7–4.0)
MCHC: 33.5 g/dL (ref 30.0–36.0)
MCV: 95.1 fl (ref 78.0–100.0)
Monocytes Absolute: 0.7 10*3/uL (ref 0.1–1.0)
Monocytes Relative: 10.2 % (ref 3.0–12.0)
Neutro Abs: 4.7 10*3/uL (ref 1.4–7.7)
Neutrophils Relative %: 64.3 % (ref 43.0–77.0)
Platelets: 133 10*3/uL — ABNORMAL LOW (ref 150.0–400.0)
RBC: 5.01 Mil/uL (ref 4.22–5.81)
RDW: 14.9 % (ref 11.5–15.5)
WBC: 7.3 10*3/uL (ref 4.0–10.5)

## 2015-06-08 LAB — URINALYSIS, ROUTINE W REFLEX MICROSCOPIC
Hgb urine dipstick: NEGATIVE
LEUKOCYTES UA: NEGATIVE
NITRITE: NEGATIVE
RBC / HPF: NONE SEEN (ref 0–?)
Specific Gravity, Urine: >=1.03 — AB (ref 1.000–1.030)
URINE GLUCOSE: NEGATIVE
UROBILINOGEN UA: 0.2 (ref 0.0–1.0)
pH: 5.5 (ref 5.0–8.0)

## 2015-06-08 LAB — AMYLASE: Amylase: 64 U/L (ref 27–131)

## 2015-06-08 LAB — LIPASE: LIPASE: 40 U/L (ref 11.0–59.0)

## 2015-06-08 LAB — C-REACTIVE PROTEIN: CRP: 0.2 mg/dL — ABNORMAL LOW (ref 0.5–20.0)

## 2015-06-08 LAB — HEMOGLOBIN A1C: Hgb A1c MFr Bld: 6 % (ref 4.6–6.5)

## 2015-06-08 NOTE — Progress Notes (Signed)
Subjective:  Patient ID: Jacob Gordon, male    DOB: 1955-12-22  Age: 59 y.o. MRN: [id number]  CC: Abdominal Pain   HPI Jacob Gordon presents for one-week history of left flank pain that he describes as a pressure sensation. The pain radiates towards his left lower quadrant. He also complains of constipation, urinary frequency and nocturia. He has a remote history of kidney stones.  Outpatient Prescriptions Prior to Visit  Medication Sig Dispense Refill  . acetaminophen (TYLENOL) 500 MG tablet Take 500 mg by mouth every 6 (six) hours as needed. Only AS NEEDED.    Marland Kitchen amLODipine (NORVASC) 10 MG tablet TAKE 1 TABLET BY MOUTH EVERY DAY. NEEDS OFFICE VISIT FOR FURTHER REFILLS. 90 tablet 2  . Butalbital-Acetaminophen (BUPAP) 50-300 MG TABS Take 1 tablet by mouth 2 (two) times daily as needed. 60 tablet 2  . carvedilol (COREG) 25 MG tablet TAKE 1 TABLET BY MOUTH TWICE DAILY WITH A MEAL. NEEDS OFFICE VISIT FOR FURTHER REFILLS. 180 tablet 0  . cholecalciferol (VITAMIN D) 1000 UNITS tablet Take 1 tablet (1,000 Units total) by mouth daily. Start in 2 month 100 tablet 3  . clindamycin (CLEOCIN T) 1 % external solution Apply topically as directed. 90 mL 2  . clonazePAM (KLONOPIN) 0.5 MG tablet TAKE 1 TABLET BY MOUTH TWICE DAILY AS NEEDED FOR ANXIETY 60 tablet 0  . Cyanocobalamin (VITAMIN B-12) 1000 MCG SUBL Place 1 tablet (1,000 mcg total) under the tongue daily. 100 tablet 3  . ergocalciferol (VITAMIN D2) 50000 UNITS capsule Take 1 capsule (50,000 Units total) by mouth once a week. 8 capsule 0  . meloxicam (MOBIC) 15 MG tablet TAKE 1 TABLET BY MOUTH DAILY WITH FOOD FOR 2 WEEKS, THEN AS NEEDED 90 tablet 0  . nortriptyline (PAMELOR) 10 MG capsule Take 2-3 capsules (20-30 mg total) by mouth at bedtime. 90 capsule 5  . tadalafil (CIALIS) 5 MG tablet Take 1 tablet (5 mg total) by mouth daily. 100 tablet 3  . TRADJENTA 5 MG TABS tablet TAKE 1 TABLET BY MOUTH EVERY DAY 30 tablet 5  . WELCHOL 625  MG tablet TAKE 3 TABLETS BY MOUTH TWICE DAILY WITH A MEAL 180 tablet 1   No facility-administered medications prior to visit.    ROS Review of Systems  Constitutional: Negative.  Negative for fever, chills, diaphoresis, appetite change and fatigue.  HENT: Negative.  Negative for trouble swallowing.   Eyes: Negative.   Respiratory: Negative.  Negative for cough, choking, chest tightness, shortness of breath and stridor.   Cardiovascular: Negative.  Negative for chest pain, palpitations and leg swelling.  Gastrointestinal: Positive for abdominal pain and constipation. Negative for nausea, vomiting, diarrhea, blood in stool, abdominal distention, anal bleeding and rectal pain.  Endocrine: Negative.   Genitourinary: Positive for frequency and flank pain. Negative for urgency, hematuria, scrotal swelling, difficulty urinating and testicular pain.  Musculoskeletal: Negative.   Skin: Negative.   Allergic/Immunologic: Negative.   Neurological: Negative.   Hematological: Negative.  Negative for adenopathy. Does not bruise/bleed easily.  Psychiatric/Behavioral: Negative.     Objective:  BP 120/84 mmHg  Pulse 72  Temp(Src) 98.3 F (36.8 C) (Oral)  Resp 16  Ht 5\' 9"  (1.753 m)  Wt 193 lb (87.544 kg)  BMI 28.49 kg/m2  SpO2 96%  BP Readings from Last 3 Encounters:  06/08/15 120/84  10/18/14 138/90  05/02/14 163/108    Wt Readings from Last 3 Encounters:  06/08/15 193 lb (87.544 kg)  10/18/14 190 lb (86.183  kg)  03/19/13 199 lb (90.266 kg)    Physical Exam  Constitutional: He is oriented to person, place, and time. He appears well-developed and well-nourished.  Non-toxic appearance. He does not have a sickly appearance. He does not appear ill. No distress.  HENT:  Mouth/Throat: Oropharynx is clear and moist. No oropharyngeal exudate.  Eyes: Conjunctivae are normal. Right eye exhibits no discharge. Left eye exhibits no discharge. No scleral icterus.  Neck: Normal range of motion.  Neck supple. No JVD present. No tracheal deviation present. No thyromegaly present.  Cardiovascular: Normal rate, regular rhythm, normal heart sounds and intact distal pulses.  Exam reveals no gallop and no friction rub.   No murmur heard. Pulmonary/Chest: Effort normal and breath sounds normal. No stridor. No respiratory distress. He has no wheezes. He has no rales. He exhibits no tenderness.  Abdominal: Soft. Normal appearance and bowel sounds are normal. He exhibits no shifting dullness, no distension, no pulsatile liver, no fluid wave, no abdominal bruit, no ascites, no pulsatile midline mass and no mass. There is no hepatosplenomegaly, splenomegaly or hepatomegaly. There is tenderness in the suprapubic area, left upper quadrant and left lower quadrant. There is no rigidity, no rebound, no guarding, no CVA tenderness, no tenderness at McBurney's point and negative Murphy's sign. No hernia. Hernia confirmed negative in the ventral area, confirmed negative in the right inguinal area and confirmed negative in the left inguinal area.  Genitourinary: Rectum normal, testes normal and penis normal. Rectal exam shows no external hemorrhoid, no internal hemorrhoid, no fissure, no mass, no tenderness and anal tone normal. Guaiac negative stool. Prostate is enlarged (1+ smooth symm BPH). Prostate is not tender. Right testis shows no mass, no swelling and no tenderness. Right testis is descended. Left testis shows no mass, no swelling and no tenderness. Left testis is descended. Circumcised. No penile erythema or penile tenderness. No discharge found.  Musculoskeletal: Normal range of motion. He exhibits no edema or tenderness.  Lymphadenopathy:    He has no cervical adenopathy.       Right: No inguinal adenopathy present.       Left: No inguinal adenopathy present.  Neurological: He is oriented to person, place, and time.  Skin: Skin is warm and dry. No rash noted. He is not diaphoretic. No erythema. No pallor.   Psychiatric: He has a normal mood and affect. His behavior is normal. Judgment and thought content normal.    Lab Results  Component Value Date   WBC 7.3 06/08/2015   HGB 15.9 06/08/2015   HCT 47.7 06/08/2015   PLT 133.0* 06/08/2015   GLUCOSE 106* 06/08/2015   CHOL 253* 10/20/2014   TRIG 67.0 10/20/2014   HDL 55.60 10/20/2014   LDLDIRECT 184.1 10/20/2012   LDLCALC 184* 10/20/2014   ALT 24 06/08/2015   AST 22 06/08/2015   NA 140 06/08/2015   K 3.7 06/08/2015   CL 105 06/08/2015   CREATININE 1.45 06/08/2015   BUN 16 06/08/2015   CO2 29 06/08/2015   TSH 1.11 10/20/2014   PSA 1.00 10/20/2014   HGBA1C 6.0 06/08/2015   MICROALBUR 0.7 11/21/2008    No results found.  Assessment & Plan:   Jacob Gordon was seen today for abdominal pain.  Diagnoses and all orders for this visit:  Diabetes mellitus type 2, controlled- his blood sugars are well-controlled Orders: -     Hemoglobin A1c; Future  Essential hypertension- his blood pressure is well-controlled, electrolytes and renal function are stable. Orders: -  Comprehensive metabolic panel; Future -     CBC with Differential/Platelet; Future -     Urinalysis, Routine w reflex microscopic (not at [facility]); Future  Abdominal tenderness of left lower quadrant- his labs are all normal, he has some tenderness on examination but there is no rebound or guarding. Will get a CT scan to see if there is a mass in the abdomen to explain his discomfort. Orders: -     Lipase; Future -     Amylase; Future -     C-reactive protein; Future -     CT Abdomen Pelvis W Contrast; Future   I am having Mr. Kaeser maintain his acetaminophen, clindamycin, Butalbital-Acetaminophen, nortriptyline, meloxicam, clonazePAM, tadalafil, ergocalciferol, cholecalciferol, Vitamin B-12, TRADJENTA, amLODipine, carvedilol, and WELCHOL.  No orders of the defined types were placed in this encounter.     Follow-up: Return in about 1 week (around 06/15/2015).  Sanda Linger, MD

## 2015-06-08 NOTE — Patient Instructions (Signed)
Flank Pain °Flank pain refers to pain that is located on the side of the body between the upper abdomen and the back. The pain may occur over a short period of time (acute) or may be long-term or reoccurring (chronic). It may be mild or severe. Flank pain can be caused by many things. °CAUSES  °Some of the more common causes of flank pain include: °· Muscle strains.   °· Muscle spasms.   °· A disease of your spine (vertebral disk disease).   °· A lung infection (pneumonia).   °· Fluid around your lungs (pulmonary edema).   °· A kidney infection.   °· Kidney stones.   °· A very painful skin rash caused by the chickenpox virus (shingles).   °· Gallbladder disease.   °HOME CARE INSTRUCTIONS  °Home care will depend on the cause of your pain. In general, °· Rest as directed by your caregiver. °· Drink enough fluids to keep your urine clear or pale yellow. °· Only take over-the-counter or prescription medicines as directed by your caregiver. Some medicines may help relieve the pain. °· Tell your caregiver about any changes in your pain. °· Follow up with your caregiver as directed. °SEEK IMMEDIATE MEDICAL CARE IF:  °· Your pain is not controlled with medicine.   °· You have new or worsening symptoms. °· Your pain increases.   °· You have abdominal pain.   °· You have shortness of breath.   °· You have persistent nausea or vomiting.   °· You have swelling in your abdomen.   °· You feel faint or pass out.   °· You have blood in your urine. °· You have a fever or persistent symptoms for more than 2-3 days. °· You have a fever and your symptoms suddenly get worse. °MAKE SURE YOU:  °· Understand these instructions. °· Will watch your condition. °· Will get help right away if you are not doing well or get worse. °Document Released: 12/12/2005 Document Revised: 07/15/2012 Document Reviewed: 06/04/2012 °ExitCare® Patient Information ©2015 [employer]. This information is not intended to replace advice given to you by your  health care provider. Make sure you discuss any questions you have with your health care provider. ° °

## 2015-06-08 NOTE — Progress Notes (Signed)
Pre visit review using our clinic review tool, if applicable. No additional management support is needed unless otherwise documented below in the visit note. 

## 2015-06-12 ENCOUNTER — Ambulatory Visit (INDEPENDENT_AMBULATORY_CARE_PROVIDER_SITE_OTHER)
Admission: RE | Admit: 2015-06-12 | Discharge: 2015-06-12 | Disposition: A | Discharge status: Home / Self Care | Payer: 59 | Source: Ambulatory Visit | Attending: Internal Medicine | Admitting: Internal Medicine

## 2015-06-12 ENCOUNTER — Encounter: Payer: Self-pay | Admitting: Internal Medicine

## 2015-06-12 DIAGNOSIS — R10814 Left lower quadrant abdominal tenderness: Secondary | ICD-10-CM | POA: Diagnosis not present

## 2015-06-12 MED ORDER — IOHEXOL 300 MG/ML  SOLN
100.0000 mL | Freq: Once | INTRAMUSCULAR | Status: AC | PRN
  Administered 2015-06-12: 100 mL via INTRAVENOUS

## 2015-06-15 ENCOUNTER — Ambulatory Visit: Payer: 59 | Admitting: Internal Medicine

## 2015-07-06 ENCOUNTER — Telehealth: Payer: Self-pay | Admitting: Internal Medicine

## 2015-07-06 NOTE — Telephone Encounter (Signed)
I called pt's ins and spoke to [place]. I answered all questions and Cialis 5 mg PA is approved effective today until 07/05/2016. PA [id number]. [facility] an patient informed.

## 2015-07-06 NOTE — Telephone Encounter (Signed)
Pt called in would like to know the status of the PA of the pt tadalafil (CIALIS) 5 MG tablet [[id number]] .  Have you see one?

## 2015-07-06 NOTE — Telephone Encounter (Signed)
No, I was not aware a PA was needed. Will contact pt's pharmacy to verify.

## 2015-07-13 ENCOUNTER — Other Ambulatory Visit: Payer: Self-pay | Admitting: *Deleted

## 2015-07-13 MED ORDER — LINAGLIPTIN 5 MG PO TABS: 5.0000 mg | ORAL_TABLET | Freq: Every day | ORAL | Status: DC

## 2015-10-20 DIAGNOSIS — M722 Plantar fascial fibromatosis: Secondary | ICD-10-CM

## 2015-11-10 ENCOUNTER — Other Ambulatory Visit: Payer: Self-pay | Admitting: Internal Medicine

## 2015-11-23 ENCOUNTER — Ambulatory Visit (INDEPENDENT_AMBULATORY_CARE_PROVIDER_SITE_OTHER): Payer: 59

## 2015-11-23 ENCOUNTER — Encounter: Payer: Self-pay | Admitting: Podiatry

## 2015-11-23 ENCOUNTER — Ambulatory Visit (INDEPENDENT_AMBULATORY_CARE_PROVIDER_SITE_OTHER): Payer: 59 | Admitting: Podiatry

## 2015-11-23 DIAGNOSIS — M779 Enthesopathy, unspecified: Secondary | ICD-10-CM

## 2015-11-23 MED ORDER — TRIAMCINOLONE ACETONIDE 10 MG/ML IJ SUSP
10.0000 mg | Freq: Once | INTRAMUSCULAR | Status: AC
  Administered 2015-11-23: 10 mg

## 2015-11-23 NOTE — Progress Notes (Signed)
Subjective:     Patient ID: Jacob Gordon, male   DOB: 08-22-1956, 60 y.o.   MRN: [id number]  HPI patient states for the last few months she's been getting pain around his lesser metatarsal joint and does not remember specific injury of both feet   Review of Systems     Objective:   Physical Exam Neurovascular status intact muscle strength adequate with inflammation and pain third metatarsophalangeal joint bilateral that is localized in nature with no proximal edema erythema drainage noted    Assessment:     Appears to be inflammatory capsulitis third MPJ bilateral    Plan:     H&P and x-rays reviewed. I did a proximal blocks of each area and aspirated the third MPJ getting out of small amount of clear fluid and injected with a quarter cc deck some some Kenalog and discussed long-term orthotics and dispensed metatarsal pads today. The orthotics he is wearing is not perfectly adequate for him

## 2015-12-14 ENCOUNTER — Ambulatory Visit (INDEPENDENT_AMBULATORY_CARE_PROVIDER_SITE_OTHER): Payer: 59 | Admitting: Podiatry

## 2015-12-14 ENCOUNTER — Encounter: Payer: Self-pay | Admitting: Podiatry

## 2015-12-14 VITALS — BP 141/87 | HR 71 | Resp 16

## 2015-12-14 DIAGNOSIS — M779 Enthesopathy, unspecified: Secondary | ICD-10-CM

## 2015-12-14 DIAGNOSIS — R52 Pain, unspecified: Secondary | ICD-10-CM

## 2015-12-14 NOTE — Progress Notes (Signed)
Subjective:     Patient ID: Jacob Gordon, male   DOB: [birth year], 60 y.o.   MRN: [id number]  HPI patient states that I'm doing okay but I'm still having a lot of pain in those joints and I need something to take pressure off of   Review of Systems     Objective:   Physical Exam Neurovascular status intact muscle strength adequate with continued discomfort around the third metatarsophalangeal joint bilateral with inflammation and fluid upon palpation    Assessment:     Inflammatory capsulitis third MPJ bilateral    Plan:     Scanned for custom orthotics to reduce stress against the joints and did this through a plaster technique. Discussed continued padding usage and rigid bottom shoes

## 2015-12-24 ENCOUNTER — Encounter: Payer: Self-pay | Admitting: Internal Medicine

## 2015-12-29 NOTE — Telephone Encounter (Signed)
Amlodipine - 07/2011 Carvediolol - 09/2012 Clonazepam - 07/2012 Tradjenta - 04/2012

## 2016-01-03 ENCOUNTER — Ambulatory Visit (INDEPENDENT_AMBULATORY_CARE_PROVIDER_SITE_OTHER): Payer: 59

## 2016-01-03 DIAGNOSIS — M779 Enthesopathy, unspecified: Secondary | ICD-10-CM

## 2016-01-03 NOTE — Patient Instructions (Signed)

## 2016-01-03 NOTE — Progress Notes (Signed)
Patient ID: Jacob Gordon, male   DOB: 10-07-1956, 60 y.o.   MRN: [id number] Patient presents to pick up orthotics. Orthotics dispensed with instructions. Patient is to follow-up with any concerns or complications on an as needed basis.

## 2016-01-05 ENCOUNTER — Other Ambulatory Visit: Payer: Self-pay

## 2016-01-05 MED ORDER — COLESEVELAM HCL 625 MG PO TABS: ORAL_TABLET | ORAL | Status: DC

## 2016-02-01 ENCOUNTER — Telehealth: Payer: Self-pay | Admitting: *Deleted

## 2016-02-01 NOTE — Telephone Encounter (Signed)
I called pt to see if he has received his 2016-17 Flu vaccine. No answer. Left mess for patient to call back if he would like to receive vaccine today or tomorrow 02/02/16.

## 2016-02-22 ENCOUNTER — Ambulatory Visit (INDEPENDENT_AMBULATORY_CARE_PROVIDER_SITE_OTHER): Payer: 59 | Admitting: Internal Medicine

## 2016-02-22 ENCOUNTER — Encounter: Payer: Self-pay | Admitting: Internal Medicine

## 2016-02-22 VITALS — BP 138/90 | HR 75 | Ht 69.0 in | Wt 189.0 lb

## 2016-02-22 DIAGNOSIS — Z0000 Encounter for general adult medical examination without abnormal findings: Secondary | ICD-10-CM

## 2016-02-22 DIAGNOSIS — E1121 Type 2 diabetes mellitus with diabetic nephropathy: Secondary | ICD-10-CM

## 2016-02-22 DIAGNOSIS — N320 Bladder-neck obstruction: Secondary | ICD-10-CM

## 2016-02-22 DIAGNOSIS — M2550 Pain in unspecified joint: Secondary | ICD-10-CM

## 2016-02-22 DIAGNOSIS — I10 Essential (primary) hypertension: Secondary | ICD-10-CM

## 2016-02-22 LAB — GLUCOSE, POCT (MANUAL RESULT ENTRY): POC Glucose: 148 mg/dl — AB (ref 70–99)

## 2016-02-22 MED ORDER — VITAMIN D3 50 MCG (2000 UT) PO CAPS: 2000.0000 [IU] | ORAL_CAPSULE | Freq: Every day | ORAL | Status: DC

## 2016-02-22 NOTE — Assessment & Plan Note (Addendum)
We discussed age appropriate health related issues, including available/recomended screening tests and vaccinations. We discussed a need for adhering to healthy diet and exercise. Labs/EKG were reviewed/ordered. All questions were answered. Colon 2012

## 2016-02-22 NOTE — Assessment & Plan Note (Signed)
Labs [employer]

## 2016-02-22 NOTE — Progress Notes (Signed)
Subjective:  Patient ID: Jacob Gordon, male    DOB: [birth year]  Age: 60 y.o. MRN: [id number]  CC: Annual Exam   HPI Jacob Gordon presents for a well exam C/o a lot of arthralgias and stiffness x years; worse lately. Pain in the joints is severe. Pt just finished Prednisone per [place]. On statins x 2 mo  Outpatient Prescriptions Prior to Visit  Medication Sig Dispense Refill  . acetaminophen (TYLENOL) 500 MG tablet Take 500 mg by mouth every 6 (six) hours as needed. Only AS NEEDED.    Marland Kitchen amLODipine (NORVASC) 10 MG tablet TAKE 1 TABLET BY MOUTH EVERY DAY. NEEDS OFFICE VISIT FOR FURTHER REFILLS. 90 tablet 2  . Butalbital-Acetaminophen (BUPAP) 50-300 MG TABS Take 1 tablet by mouth 2 (two) times daily as needed. 60 tablet 2  . carvedilol (COREG) 25 MG tablet TAKE 1 TABLET BY MOUTH TWICE DAILY WITH A MEAL. NEEDS OFFICE VISIT FOR FURTHER REFILLS. 180 tablet 0  . CIALIS 5 MG tablet TAKE 1 TABLET BY MOUTH EVERY DAY 100 tablet 0  . clindamycin (CLEOCIN T) 1 % external solution Apply topically as directed. 90 mL 2  . clonazePAM (KLONOPIN) 0.5 MG tablet TAKE 1 TABLET BY MOUTH TWICE DAILY AS NEEDED FOR ANXIETY 60 tablet 0  . colesevelam (WELCHOL) 625 MG tablet TAKE 3 TABLETS BY MOUTH TWICE DAILY WITH A MEAL-----patient needs office visit/labs before any further refills 180 tablet 0  . linagliptin (TRADJENTA) 5 MG TABS tablet Take 1 tablet (5 mg total) by mouth daily. 30 tablet 5  . meloxicam (MOBIC) 15 MG tablet TAKE 1 TABLET BY MOUTH DAILY WITH FOOD FOR 2 WEEKS, THEN AS NEEDED 90 tablet 0  . nortriptyline (PAMELOR) 10 MG capsule Take 2-3 capsules (20-30 mg total) by mouth at bedtime. 90 capsule 5  . Cyanocobalamin (VITAMIN B-12) 1000 MCG SUBL Place 1 tablet (1,000 mcg total) under the tongue daily. (Patient not taking: Reported on 02/22/2016) 100 tablet 3  . ergocalciferol (VITAMIN D2) 50000 UNITS capsule Take 1 capsule (50,000 Units total) by mouth once a week. (Patient not taking: Reported  on 02/22/2016) 8 capsule 0   No facility-administered medications prior to visit.    ROS Review of Systems  Constitutional: Positive for fatigue. Negative for appetite change and unexpected weight change.  HENT: Negative for congestion, nosebleeds, sneezing, sore throat and trouble swallowing.   Eyes: Negative for itching and visual disturbance.  Respiratory: Negative for cough.   Cardiovascular: Negative for chest pain, palpitations and leg swelling.  Gastrointestinal: Negative for nausea, diarrhea, blood in stool and abdominal distention.  Genitourinary: Negative for frequency and hematuria.  Musculoskeletal: Positive for back pain, arthralgias, gait problem, neck pain and neck stiffness. Negative for joint swelling.  Skin: Negative for rash.  Neurological: Negative for dizziness, tremors, speech difficulty and weakness.  Psychiatric/Behavioral: Negative for sleep disturbance, dysphoric mood and agitation. The patient is not nervous/anxious.     Objective:  BP 138/90 mmHg  Pulse 75  Ht  (1.753 m)  Wt 189 lb (85.73 kg)  BMI 27.90 kg/m2  SpO2 97%  BP Readings from Last 3 Encounters:  02/22/16 138/90  12/14/15 141/87  06/08/15 120/84    Wt Readings from Last 3 Encounters:  02/22/16 189 lb (85.73 kg)  06/08/15 193 lb (87.544 kg)  10/18/14 190 lb (86.183 kg)    Physical Exam  Constitutional: He is oriented to person, place, and time. He appears well-developed and well-nourished. No distress.  HENT:  Head: Normocephalic  and atraumatic.  Right Ear: External ear normal.  Left Ear: External ear normal.  Nose: Nose normal.  Mouth/Throat: Oropharynx is clear and moist. No oropharyngeal exudate.  Eyes: Conjunctivae and EOM are normal. Pupils are equal, round, and reactive to light. Right eye exhibits no discharge. Left eye exhibits no discharge. No scleral icterus.  Neck: Normal range of motion. Neck supple. No JVD present. No tracheal deviation present. No thyromegaly  present.  Cardiovascular: Normal rate, regular rhythm, normal heart sounds and intact distal pulses.  Exam reveals no gallop and no friction rub.   No murmur heard. Pulmonary/Chest: Effort normal and breath sounds normal. No stridor. No respiratory distress. He has no wheezes. He has no rales. He exhibits no tenderness.  Abdominal: Soft. Bowel sounds are normal. He exhibits no distension and no mass. There is no tenderness. There is no rebound and no guarding.  Musculoskeletal: Normal range of motion. He exhibits tenderness. He exhibits no edema.  Lymphadenopathy:    He has no cervical adenopathy.  Neurological: He is alert and oriented to person, place, and time. He has normal reflexes. No cranial nerve deficit. He exhibits normal muscle tone. Coordination normal.  Skin: Skin is warm and dry. No rash noted. He is not diaphoretic. No erythema. No pallor.  Psychiatric: He has a normal mood and affect. His behavior is normal. Judgment and thought content normal.  Pt moves w/difficulties due to pain and stiffness Shoulders, knees, hands, LS back, neck - tender w/ROM and palpation Rectal 1 mo ago at the [place] nl  EKG NSR, no change from 2012  Lab Results  Component Value Date   WBC 7.3 06/08/2015   HGB 15.9 06/08/2015   HCT 47.7 06/08/2015   PLT 133.0* 06/08/2015   GLUCOSE 106* 06/08/2015   CHOL 253* 10/20/2014   TRIG 67.0 10/20/2014   HDL 55.60 10/20/2014   LDLDIRECT 184.1 10/20/2012   LDLCALC 184* 10/20/2014   ALT 24 06/08/2015   AST 22 06/08/2015   NA 140 06/08/2015   K 3.7 06/08/2015   CL 105 06/08/2015   CREATININE 1.45 06/08/2015   BUN 16 06/08/2015   CO2 29 06/08/2015   TSH 1.11 10/20/2014   PSA 1.00 10/20/2014   HGBA1C 6.0 06/08/2015   MICROALBUR 0.7 11/21/2008    Ct Abdomen Pelvis W Contrast  06/12/2015  CLINICAL DATA:  Left flank pain and left lower quadrant tenderness. Diarrhea. EXAM: CT ABDOMEN AND PELVIS WITH CONTRAST TECHNIQUE: Multidetector CT imaging of the abdomen  and pelvis was performed using the standard protocol following bolus administration of intravenous contrast. CONTRAST:  OMNIPAQUE IOHEXOL 300 MG/ML  SOLN COMPARISON:  CT scan dated 01/31/2010 FINDINGS: Lower chest:  Normal. Hepatobiliary: There several small benign cysts in the liver the largest being 8 mm in diameter in the mid right lobe. Liver parenchyma and biliary tree are otherwise normal. Pancreas: Pancreatic parenchyma is normal. Slight chronic prominence of the pancreatic duct with a diameter of 2.5 mm. Pancreatic duct has a separate ampulla. Spleen: Normal. Adrenals/Urinary Tract: 5 and 9 mm cyst in the upper pole of the left kidney. 4 mm stone in the upper pole of the left kidney. 4 mm cyst in the lower pole of the left kidney. Otherwise normal. Stomach/Bowel: Normal. Vascular/Lymphatic: Aortic atherosclerosis.  No adenopathy. Reproductive: Normal. Other: No free air or free fluid. 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 Moderate arthritis of both hips, right more than left. IMPRESSION: No acute abnormality of the abdomen. Small stone in the  upper pole of the left kidney. Electronically Signed   By: Francene BoyersJames  Maxwell M.D.   On: 06/12/2015 16:26    Assessment & Plan:   There are no diagnoses linked to this encounter. I have discontinued Mr. Marigene EhlersLee's ergocalciferol and Vitamin B-12. I am also having him maintain his acetaminophen, clindamycin, Butalbital-Acetaminophen, nortriptyline, meloxicam, clonazePAM, amLODipine, carvedilol, linagliptin, CIALIS, colesevelam, atorvastatin, terazosin, DULoxetine, loperamide, naproxen sodium, Calcium Carbonate Antacid (TUMS PO), Loperamide HCl (IMODIUM PO), and cyanocobalamin.  Meds ordered this encounter  Medications  . atorvastatin (LIPITOR) 20 MG tablet    Sig: Take 20 mg by mouth daily.  Marland Kitchen. terazosin (HYTRIN) 2 MG capsule    Sig: Take 2 mg by mouth at bedtime.  . DULoxetine (CYMBALTA) 30 MG capsule    Sig: Take 30 mg by  mouth daily.  Marland Kitchen. loperamide (IMODIUM) 2 MG capsule    Sig: Take 2 mg by mouth as needed for diarrhea or loose stools.  . naproxen sodium (ALEVE) 220 MG tablet    Sig: Take 220 mg by mouth as needed.  . Calcium Carbonate Antacid (TUMS PO)    Sig: Take by mouth daily.  . Loperamide HCl (IMODIUM PO)    Sig: Take by mouth as needed.  . cyanocobalamin 1000 MCG tablet    Sig: Take 1,000 mcg by mouth daily.     Follow-up: No Follow-up on file.  Sonda PrimesAlex Plotnikov, MD

## 2016-02-22 NOTE — Assessment & Plan Note (Signed)
Chronic , severe, disabling Arthritis of ?etiology Mr Jacob Gordon is disabled [facility]rays

## 2016-02-22 NOTE — Assessment & Plan Note (Signed)
Chronic  Amlodipine, Coreg Labs

## 2016-02-22 NOTE — Patient Instructions (Signed)
Please see a Rheumatologist

## 2016-02-22 NOTE — Assessment & Plan Note (Signed)
[facility]

## 2016-02-22 NOTE — Progress Notes (Signed)
Pre visit review using our clinic review tool, if applicable. No additional management support is needed unless otherwise documented below in the visit note. 

## 2016-02-23 ENCOUNTER — Other Ambulatory Visit (INDEPENDENT_AMBULATORY_CARE_PROVIDER_SITE_OTHER): Payer: 59

## 2016-02-23 DIAGNOSIS — Z0000 Encounter for general adult medical examination without abnormal findings: Secondary | ICD-10-CM | POA: Diagnosis not present

## 2016-02-23 DIAGNOSIS — E1121 Type 2 diabetes mellitus with diabetic nephropathy: Secondary | ICD-10-CM

## 2016-02-23 LAB — CBC WITH DIFFERENTIAL/PLATELET
BASOS ABS: 0 10*3/uL (ref 0.0–0.1)
Basophils Relative: 0.3 % (ref 0.0–3.0)
Eosinophils Absolute: 0.1 10*3/uL (ref 0.0–0.7)
Eosinophils Relative: 0.9 % (ref 0.0–5.0)
HCT: 45.7 % (ref 39.0–52.0)
Hemoglobin: 15.6 g/dL (ref 13.0–17.0)
LYMPHS ABS: 1.5 10*3/uL (ref 0.7–4.0)
Lymphocytes Relative: 20.3 % (ref 12.0–46.0)
MCHC: 34 g/dL (ref 30.0–36.0)
MCV: 94.5 fl (ref 78.0–100.0)
Monocytes Absolute: 0.8 10*3/uL (ref 0.1–1.0)
Monocytes Relative: 10.4 % (ref 3.0–12.0)
NEUTROS PCT: 68.1 % (ref 43.0–77.0)
Neutro Abs: 5 10*3/uL (ref 1.4–7.7)
Platelets: 117 10*3/uL — ABNORMAL LOW (ref 150.0–400.0)
RBC: 4.84 Mil/uL (ref 4.22–5.81)
RDW: 14.1 % (ref 11.5–15.5)
WBC: 7.3 10*3/uL (ref 4.0–10.5)

## 2016-02-23 LAB — LIPID PANEL
Cholesterol: 145 mg/dL (ref 0–200)
HDL: 53.9 mg/dL (ref >39.00)
LDL Cholesterol: 80 mg/dL (ref 0–99)
NONHDL: 90.94
TRIGLYCERIDES: 53 mg/dL (ref 0.0–149.0)
Total CHOL/HDL Ratio: 3
VLDL: 10.6 mg/dL (ref 0.0–40.0)

## 2016-02-23 LAB — HEPATIC FUNCTION PANEL
ALK PHOS: 61 U/L (ref 39–117)
ALT: 24 U/L (ref 0–53)
AST: 19 U/L (ref 0–37)
Albumin: 4 g/dL (ref 3.5–5.2)
Bilirubin, Direct: 0.3 mg/dL (ref 0.0–0.3)
Total Bilirubin: 1.6 mg/dL — ABNORMAL HIGH (ref 0.2–1.2)
Total Protein: 6.5 g/dL (ref 6.0–8.3)

## 2016-02-23 LAB — URINALYSIS
Bilirubin Urine: NEGATIVE
Hgb urine dipstick: NEGATIVE
KETONES UR: NEGATIVE
LEUKOCYTES UA: NEGATIVE
Nitrite: NEGATIVE
PH: 5.5 (ref 5.0–8.0)
SPECIFIC GRAVITY, URINE: 1.025 (ref 1.000–1.030)
Total Protein, Urine: NEGATIVE
Urine Glucose: NEGATIVE
Urobilinogen, UA: 0.2 (ref 0.0–1.0)

## 2016-02-23 LAB — BASIC METABOLIC PANEL
BUN: 16 mg/dL (ref 6–23)
CO2: 30 mEq/L (ref 19–32)
Calcium: 9.4 mg/dL (ref 8.4–10.5)
Chloride: 107 mEq/L (ref 96–112)
Creatinine, Ser: 1.27 mg/dL (ref 0.40–1.50)
GFR: 74.57 mL/min (ref >60.00)
GLUCOSE: 110 mg/dL — AB (ref 70–99)
Potassium: 3.9 mEq/L (ref 3.5–5.1)
Sodium: 143 mEq/L (ref 135–145)

## 2016-02-23 LAB — HEMOGLOBIN A1C: Hgb A1c MFr Bld: 6.4 % (ref 4.6–6.5)

## 2016-02-23 LAB — TSH: TSH: 1.53 u[IU]/mL (ref 0.35–4.50)

## 2016-03-12 ENCOUNTER — Other Ambulatory Visit: Payer: Self-pay | Admitting: Internal Medicine

## 2016-03-18 ENCOUNTER — Other Ambulatory Visit: Payer: Self-pay | Admitting: Internal Medicine

## 2016-04-06 ENCOUNTER — Other Ambulatory Visit: Payer: Self-pay | Admitting: Internal Medicine

## 2016-05-17 ENCOUNTER — Other Ambulatory Visit: Payer: Self-pay | Admitting: *Deleted

## 2016-05-17 MED ORDER — LINAGLIPTIN 5 MG PO TABS: 5.0000 mg | ORAL_TABLET | Freq: Every day | ORAL | Status: DC

## 2016-05-27 ENCOUNTER — Other Ambulatory Visit: Payer: Self-pay | Admitting: *Deleted

## 2016-05-27 MED ORDER — COLESEVELAM HCL 625 MG PO TABS: ORAL_TABLET | ORAL | 1 refills | Status: DC

## 2016-06-21 ENCOUNTER — Ambulatory Visit (INDEPENDENT_AMBULATORY_CARE_PROVIDER_SITE_OTHER): Payer: 59

## 2016-06-21 ENCOUNTER — Ambulatory Visit (INDEPENDENT_AMBULATORY_CARE_PROVIDER_SITE_OTHER): Payer: 59 | Admitting: Podiatry

## 2016-06-21 ENCOUNTER — Encounter: Payer: Self-pay | Admitting: Podiatry

## 2016-06-21 VITALS — BP 132/88 | HR 69 | Resp 12

## 2016-06-21 DIAGNOSIS — M79672 Pain in left foot: Secondary | ICD-10-CM

## 2016-06-21 DIAGNOSIS — M79671 Pain in right foot: Secondary | ICD-10-CM | POA: Diagnosis not present

## 2016-06-24 NOTE — Progress Notes (Signed)
Subjective:     Patient ID: Jacob DawleyLinwood Matthew Gordon, male   DOB: 1956-05-28, 60 y.o.   MRN: [id number]  HPI patient states that he's getting a lot of pain in the bones again and it's making it hard for him to walk   Review of Systems  All other systems reviewed and are negative.      Objective:   Physical Exam  Constitutional: He is oriented to person, place, and time.  Cardiovascular: Intact distal pulses.   Musculoskeletal: Normal range of motion.  Neurological: He is oriented to person, place, and time.  Skin: Skin is warm.  Nursing note and vitals reviewed.  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 I have recommended shortening osteotomy and I explained procedure and risk patient wants this done and will have 1 foot done and time. Today I allowed him to read a consent form going over alternative treatments complications and the fact recovery can take upwards of 6 months. Patient wants surgery understanding is no guarantee this will solve the problem and I did dispense air fracture walker for the postoperative period today with all instructions on usage  X-ray report indicated that there is no indications of arthritis or stress fracture

## 2016-06-25 ENCOUNTER — Telehealth: Payer: Self-pay | Admitting: *Deleted

## 2016-06-25 NOTE — Telephone Encounter (Signed)
"  I need a copy of my papers that I signed on Friday.  I saw Dr. Charlsie Merlesegal and he talked about surgery."  You can have a copy.  Are you going to come by and get it.  "I was hoping I would be able to get you to fax it or email it."  I am not able to fax it nor email it due to HIPAA compliance.  "Can you mail it to me?"  I'll put it in the mail.  I called and left patient a message that he will have to come by the office to get a copy of consent form.  I am not able to send out any patient information according to HIPAA without appropriate documentation on file.

## 2016-07-23 ENCOUNTER — Encounter: Payer: Self-pay | Admitting: Podiatry

## 2016-07-26 NOTE — Progress Notes (Signed)
DOS 09.19.2017 Shortening Osteotomy with Screw 3rd Left

## 2016-08-01 ENCOUNTER — Encounter (INDEPENDENT_AMBULATORY_CARE_PROVIDER_SITE_OTHER): Payer: 59 | Admitting: Podiatry

## 2016-08-01 DIAGNOSIS — M779 Enthesopathy, unspecified: Secondary | ICD-10-CM

## 2016-08-08 ENCOUNTER — Other Ambulatory Visit: Payer: Self-pay | Admitting: Internal Medicine

## 2016-11-05 DIAGNOSIS — N200 Calculus of kidney: Secondary | ICD-10-CM | POA: Diagnosis not present

## 2017-01-29 ENCOUNTER — Other Ambulatory Visit: Payer: Self-pay | Admitting: Internal Medicine

## 2017-01-29 NOTE — Telephone Encounter (Signed)
Wife left msg on triage stating pharmay will be sending a refill on husband cialis. Lately he [facility] and to take 1 every. May need PA if so PA # [phone]...Raechel Chute/lmb

## 2017-02-03 ENCOUNTER — Telehealth: Payer: Self-pay | Admitting: Internal Medicine

## 2017-02-03 NOTE — Telephone Encounter (Signed)
Pt wife called wanting a refill of Cialis, and wants to know why no refills were given normally there are 12 refills given.

## 2017-02-03 NOTE — Telephone Encounter (Signed)
OK to ref Thx 

## 2017-02-03 NOTE — Telephone Encounter (Signed)
Routing to dr plotnikov---patient has OV scheduled for 02/24/17---are you ok with refilling enough to last until that office visit, please advise, I will call patient's wife back and explain that he will get more refills after office visit, has to be seen regularly in order to get refills with this med, thanks

## 2017-02-14 NOTE — Telephone Encounter (Signed)
rx sent 3/28

## 2017-02-24 ENCOUNTER — Encounter: Payer: 59 | Admitting: Internal Medicine

## 2017-02-28 DIAGNOSIS — M1712 Unilateral primary osteoarthritis, left knee: Secondary | ICD-10-CM | POA: Diagnosis not present

## 2017-02-28 DIAGNOSIS — M542 Cervicalgia: Secondary | ICD-10-CM | POA: Diagnosis not present

## 2017-02-28 DIAGNOSIS — M25511 Pain in right shoulder: Secondary | ICD-10-CM | POA: Diagnosis not present

## 2017-03-17 ENCOUNTER — Encounter: Payer: Self-pay | Admitting: Internal Medicine

## 2017-03-17 ENCOUNTER — Ambulatory Visit (INDEPENDENT_AMBULATORY_CARE_PROVIDER_SITE_OTHER): Payer: 59 | Admitting: Internal Medicine

## 2017-03-17 VITALS — BP 116/74 | HR 74 | Temp 98.5°F | Ht 69.0 in | Wt 195.0 lb

## 2017-03-17 DIAGNOSIS — Z0000 Encounter for general adult medical examination without abnormal findings: Secondary | ICD-10-CM

## 2017-03-17 DIAGNOSIS — E1121 Type 2 diabetes mellitus with diabetic nephropathy: Secondary | ICD-10-CM | POA: Diagnosis not present

## 2017-03-17 DIAGNOSIS — L731 Pseudofolliculitis barbae: Secondary | ICD-10-CM | POA: Diagnosis not present

## 2017-03-17 DIAGNOSIS — M791 Myalgia, unspecified site: Secondary | ICD-10-CM

## 2017-03-17 DIAGNOSIS — R351 Nocturia: Secondary | ICD-10-CM

## 2017-03-17 DIAGNOSIS — N401 Enlarged prostate with lower urinary tract symptoms: Secondary | ICD-10-CM

## 2017-03-17 DIAGNOSIS — M2550 Pain in unspecified joint: Secondary | ICD-10-CM

## 2017-03-17 DIAGNOSIS — I10 Essential (primary) hypertension: Secondary | ICD-10-CM

## 2017-03-17 MED ORDER — DOXYCYCLINE HYCLATE 100 MG PO TABS: 100.0000 mg | ORAL_TABLET | Freq: Two times a day (BID) | ORAL | 0 refills | Status: DC

## 2017-03-17 MED ORDER — TADALAFIL 5 MG PO TABS: 5.0000 mg | ORAL_TABLET | Freq: Every day | ORAL | 11 refills | Status: DC

## 2017-03-17 MED ORDER — CLINDAMYCIN PHOSPHATE 1 % EX SOLN: CUTANEOUS | 2 refills | Status: DC

## 2017-03-17 MED ORDER — VITAMIN D3 50 MCG (2000 UT) PO CAPS: 2000.0000 [IU] | ORAL_CAPSULE | Freq: Every day | ORAL | 3 refills | Status: DC

## 2017-03-17 NOTE — Assessment & Plan Note (Signed)
OA and stiffness is better w/acupuncture 5/18 Try Turmeric

## 2017-03-17 NOTE — Assessment & Plan Note (Signed)
Amlodipine, Coreg 

## 2017-03-17 NOTE — Assessment & Plan Note (Signed)
We discussed age appropriate health related issues, including available/recomended screening tests and vaccinations. We discussed a need for adhering to healthy diet and exercise. Labs were ordered to be later reviewed . All questions were answered.  Colon 2012

## 2017-03-17 NOTE — Assessment & Plan Note (Signed)
Tradjenta Labs 

## 2017-03-17 NOTE — Assessment & Plan Note (Signed)
CleocinT Doxy

## 2017-03-17 NOTE — Progress Notes (Signed)
Subjective:  Patient ID: Jacob Gordon, male    DOB: 1956/07/07  Age: 61 y.o. MRN: [id number]  CC: No chief complaint on file.   HPI Jacob Gordon presents for a well exam OA and stiffness is better w/acupuncture Had labs at the [facility] C/o neck skin irritation  Outpatient Medications Prior to Visit  Medication Sig Dispense Refill  . amLODipine (NORVASC) 10 MG tablet TAKE 1 TABLET BY MOUTH EVERY DAY. NEEDS OFFICE VISIT FOR FURTHER REFILLS. 90 tablet 2  . atorvastatin (LIPITOR) 20 MG tablet Take 20 mg by mouth daily.    . Butalbital-Acetaminophen (BUPAP) 50-300 MG TABS Take 1 tablet by mouth 2 (two) times daily as needed. 60 tablet 2  . carvedilol (COREG) 25 MG tablet TAKE 1 TABLET BY MOUTH TWICE DAILY WITH A MEAL. NEEDS OFFICE VISIT FOR FURTHER REFILLS. 180 tablet 0  . clindamycin (CLEOCIN T) 1 % external solution Apply topically as directed. 90 mL 2  . clonazePAM (KLONOPIN) 0.5 MG tablet TAKE 1 TABLET BY MOUTH TWICE DAILY AS NEEDED FOR ANXIETY 60 tablet 0  . colesevelam (WELCHOL) 625 MG tablet TAKE 3 TABLETS BY MOUTH TWICE DAILY WITH A MEAL 540 tablet 1  . linagliptin (TRADJENTA) 5 MG TABS tablet Take 1 tablet (5 mg total) by mouth daily. 90 tablet 3  . loperamide (IMODIUM) 2 MG capsule Take 2 mg by mouth as needed for diarrhea or loose stools.    . naproxen sodium (ALEVE) 220 MG tablet Take 220 mg by mouth as needed.    . nortriptyline (PAMELOR) 10 MG capsule Take 2-3 capsules (20-30 mg total) by mouth at bedtime. 90 capsule 5  . tadalafil (CIALIS) 5 MG tablet Take 1 tablet (5 mg total) by mouth daily. Yearly physical is due in April must see Md for refills 30 tablet 0  . terazosin (HYTRIN) 2 MG capsule Take 8 mg by mouth at bedtime.     Marland Kitchen acetaminophen (TYLENOL) 500 MG tablet Take 500 mg by mouth every 6 (six) hours as needed. Only AS NEEDED.    . Calcium Carbonate Antacid (TUMS PO) Take by mouth daily.    . Cholecalciferol (VITAMIN D3) 2000 units capsule Take 1 capsule  (2,000 Units total) by mouth daily. 100 capsule 3  . cyanocobalamin 1000 MCG tablet Take 1,000 mcg by mouth daily.    . DULoxetine (CYMBALTA) 30 MG capsule Take 30 mg by mouth daily.    . Loperamide HCl (IMODIUM PO) Take by mouth as needed.    . meloxicam (MOBIC) 15 MG tablet TAKE 1 TABLET BY MOUTH DAILY WITH FOOD FOR 2 WEEKS, THEN AS NEEDED 90 tablet 0   No facility-administered medications prior to visit.     ROS Review of Systems  Constitutional: Negative for appetite change, fatigue and unexpected weight change.  HENT: Negative for congestion, nosebleeds, sneezing, sore throat and trouble swallowing.   Eyes: Negative for itching and visual disturbance.  Respiratory: Negative for cough.   Cardiovascular: Negative for chest pain, palpitations and leg swelling.  Gastrointestinal: Negative for abdominal distention, blood in stool, diarrhea and nausea.  Genitourinary: Positive for urgency. Negative for frequency and hematuria.  Musculoskeletal: Positive for arthralgias. Negative for back pain, gait problem, joint swelling and neck pain.  Skin: Negative for rash.  Neurological: Negative for dizziness, tremors, speech difficulty and weakness.  Psychiatric/Behavioral: Negative for agitation, dysphoric mood and sleep disturbance. The patient is not nervous/anxious.     Objective:  BP 116/74 (BP Location: Left Arm, Patient Position:  Sitting, Cuff Size: Normal)   Pulse 74   Temp 98.5 F (36.9 C) (Oral)   Ht 5\' 9"  (1.753 m)   Wt 195 lb (88.5 kg)   SpO2 99%   BMI 28.80 kg/m   BP Readings from Last 3 Encounters:  03/17/17 116/74  06/21/16 132/88  02/22/16 138/90    Wt Readings from Last 3 Encounters:  03/17/17 195 lb (88.5 kg)  02/22/16 189 lb (85.7 kg)  06/08/15 193 lb (87.5 kg)    Physical Exam  Constitutional: He is oriented to person, place, and time. He appears well-developed. No distress.  NAD  HENT:  Mouth/Throat: Oropharynx is clear and moist.  Eyes: Conjunctivae are  normal. Pupils are equal, round, and reactive to light.  Neck: Normal range of motion. No JVD present. No thyromegaly present.  Cardiovascular: Normal rate, regular rhythm, normal heart sounds and intact distal pulses.  Exam reveals no gallop and no friction rub.   No murmur heard. Pulmonary/Chest: Effort normal and breath sounds normal. No respiratory distress. He has no wheezes. He has no rales. He exhibits no tenderness.  Abdominal: Soft. Bowel sounds are normal. He exhibits no distension and no mass. There is no tenderness. There is no rebound and no guarding.  Musculoskeletal: Normal range of motion. He exhibits no edema or tenderness.  Lymphadenopathy:    He has no cervical adenopathy.  Neurological: He is alert and oriented to person, place, and time. He has normal reflexes. No cranial nerve deficit. He exhibits normal muscle tone. He displays a negative Romberg sign. Coordination and gait normal.  Skin: Skin is warm and dry. No rash noted.  Psychiatric: He has a normal mood and affect. His behavior is normal. Judgment and thought content normal.  the pt had rectal exam w/Urology Ingrown hair - beard  Lab Results  Component Value Date   WBC 7.3 02/23/2016   HGB 15.6 02/23/2016   HCT 45.7 02/23/2016   PLT 117.0 (L) 02/23/2016   GLUCOSE 110 (H) 02/23/2016   CHOL 145 02/23/2016   TRIG 53.0 02/23/2016   HDL 53.90 02/23/2016   LDLDIRECT 184.1 10/20/2012   LDLCALC 80 02/23/2016   ALT 24 02/23/2016   AST 19 02/23/2016   NA 143 02/23/2016   K 3.9 02/23/2016   CL 107 02/23/2016   CREATININE 1.27 02/23/2016   BUN 16 02/23/2016   CO2 30 02/23/2016   TSH 1.53 02/23/2016   PSA 1.00 10/20/2014   HGBA1C 6.4 02/23/2016   MICROALBUR 0.7 11/21/2008    Ct Abdomen Pelvis W Contrast  Result Date: 06/12/2015 CLINICAL DATA:  Left flank pain and left lower quadrant tenderness. Diarrhea. EXAM: CT ABDOMEN AND PELVIS WITH CONTRAST TECHNIQUE: Multidetector CT imaging of the abdomen and pelvis  was performed using the standard protocol following bolus administration of intravenous contrast. CONTRAST:  OMNIPAQUE IOHEXOL 300 MG/ML  SOLN COMPARISON:  CT scan dated 01/31/2010 FINDINGS: Lower chest:  Normal. Hepatobiliary: There several small benign cysts in the liver the largest being 8 mm in diameter in the mid right lobe. Liver parenchyma and biliary tree are otherwise normal. Pancreas: Pancreatic parenchyma is normal. Slight chronic prominence of the pancreatic duct with a diameter of 2.5 mm. Pancreatic duct has a separate ampulla. Spleen: Normal. Adrenals/Urinary Tract: 5 and 9 mm cyst in the upper pole of the left kidney. 4 mm stone in the upper pole of the left kidney. 4 mm cyst in the lower pole of the left kidney. Otherwise normal. Stomach/Bowel: Normal. Vascular/Lymphatic: Aortic  atherosclerosis.  No adenopathy. Reproductive: Normal. Other: No free air or free fluid. 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 Moderate arthritis of both hips, right more than left. IMPRESSION: No acute abnormality of the abdomen. Small stone in the upper pole of the left kidney. Electronically Signed   By: Francene BoyersJames  Maxwell M.D.   On: 06/12/2015 16:26    Assessment & Plan:   There are no diagnoses linked to this encounter. I have discontinued [name]'s acetaminophen, meloxicam, DULoxetine, Calcium Carbonate Antacid (TUMS PO), Loperamide HCl (IMODIUM PO), cyanocobalamin, and Vitamin D3. I am also having him maintain his clindamycin, Butalbital-Acetaminophen, nortriptyline, clonazePAM, amLODipine, carvedilol, atorvastatin, terazosin, loperamide, naproxen sodium, linagliptin, colesevelam, and tadalafil.  No orders of the defined types were placed in this encounter.    Follow-up: No Follow-up on file.  Sonda PrimesAlex Severo Beber, MD

## 2017-03-17 NOTE — Patient Instructions (Addendum)
You can try Turmeric for arthritis Shingrix vaccine   Ingrown Hair An ingrown hair is a hair that curls and re-enters the skin instead of growing straight out of the skin. An ingrown hair can develop in any part of the skin that hair is removed from. An ingrown hair may cause small pockets of infection. What are the causes? An ingrown hair can be caused by:  Shaving.  Tweezing.  Waxing.  Using a hair removal cream. What increases the risk? Ingrown hairs are more likely to develop in people who have curly hair. What are the signs or symptoms? Symptoms of an ingrown hair may include:  Small bumps on the skin. The bumps may be filled with pus.  Pain.  Itching. How is this diagnosed? An ingrown hair is diagnosed with a skin exam. How is this treated? Treatment is often not needed unless the ingrown hair has caused an infection. Treatment may involve:  Applying prescription creams to the skin. This can help with inflammation.  Applying warm compresses to the skin. This can help soften the skin.  Taking antibiotic medicine. An antibiotic may be prescribed if the infection is severe. Follow these instructions at home:  Do not shave irritated areas of skin. You may start shaving these areas again once the irritation has gone away.  Take, apply, or use over-the-counter and prescription medicines only as told by your health care provider. This includes any prescription creams.  If you were prescribed an antibiotic medicine, take it as told by your health care provider. Do not stop taking the antibiotic even if your condition improves.  To help remove ingrown hairs on your face, you may use a facial sponge in a gentle circular motion.  If directed, apply heat to the affected area. Use the heat source that your health care provider recommends, such as a moist heat pack or a heating pad.  Place a towel between your skin and the heat source.  Leave the heat on for 20-30  minutes.  Remove the heat if your skin turns bright red. This is especially important if you are unable to feel pain, heat, or cold. You may have a greater risk of getting burned. How is this prevented?  Shower before shaving.  Wrap areas that you are going to shave in warm, moist wraps for several minutes before shaving. The warmth and moisture helps to soften the hairs and makes ingrown hairs less likely.  Use thick shaving gels.  Use a razor that cuts hair slightly above your skin. Or, use an electric shaver with a long shave setting.  Shave in the direction of hair growth.  Avoid making multiple razor strokes.  Apply a moisturizing lotion after shaving. This information is not intended to replace advice given to you by your health care provider. Make sure you discuss any questions you have with your health care provider. Document Released: 01/27/2001 Document Revised: 05/10/2016 Document Reviewed: 08/11/2015 Elsevier Interactive Patient Education  2017 [employer]

## 2017-03-17 NOTE — Assessment & Plan Note (Signed)
On Meds  Control CBGs

## 2017-03-17 NOTE — Assessment & Plan Note (Signed)
Better w/acupunture

## 2017-03-24 ENCOUNTER — Other Ambulatory Visit: Payer: Self-pay | Admitting: Internal Medicine

## 2017-03-28 DIAGNOSIS — N200 Calculus of kidney: Secondary | ICD-10-CM | POA: Diagnosis not present

## 2017-03-28 DIAGNOSIS — N401 Enlarged prostate with lower urinary tract symptoms: Secondary | ICD-10-CM | POA: Diagnosis not present

## 2017-04-10 DIAGNOSIS — I10 Essential (primary) hypertension: Secondary | ICD-10-CM | POA: Diagnosis not present

## 2017-04-10 DIAGNOSIS — E119 Type 2 diabetes mellitus without complications: Secondary | ICD-10-CM | POA: Diagnosis not present

## 2017-04-10 DIAGNOSIS — Z125 Encounter for screening for malignant neoplasm of prostate: Secondary | ICD-10-CM | POA: Diagnosis not present

## 2017-05-13 DIAGNOSIS — H5213 Myopia, bilateral: Secondary | ICD-10-CM | POA: Diagnosis not present

## 2017-05-13 DIAGNOSIS — E119 Type 2 diabetes mellitus without complications: Secondary | ICD-10-CM | POA: Diagnosis not present

## 2017-05-13 DIAGNOSIS — R109 Unspecified abdominal pain: Secondary | ICD-10-CM | POA: Diagnosis not present

## 2017-07-08 ENCOUNTER — Other Ambulatory Visit: Payer: Self-pay | Admitting: Internal Medicine

## 2017-07-23 ENCOUNTER — Other Ambulatory Visit: Payer: Self-pay

## 2017-07-23 MED ORDER — DOXYCYCLINE HYCLATE 100 MG PO TABS: 100.0000 mg | ORAL_TABLET | Freq: Two times a day (BID) | ORAL | 0 refills | Status: DC

## 2017-08-28 ENCOUNTER — Encounter: Payer: Self-pay | Admitting: Internal Medicine

## 2017-08-28 ENCOUNTER — Telehealth: Payer: Self-pay | Admitting: Internal Medicine

## 2017-08-28 NOTE — Telephone Encounter (Signed)
Pt called and needs a Prior Authorization on  tadalafil (CIALIS) 5 MG tablet  Patient states this is URGENT Patient would like updates on the status of this

## 2017-08-29 NOTE — Telephone Encounter (Signed)
PA Started VIA phone

## 2017-08-29 NOTE — Telephone Encounter (Signed)
Insurance company requesting call back at [phone].

## 2017-08-29 NOTE — Telephone Encounter (Signed)
The wife  would like to know if a alternative can be called in as a short term supply while we wait for this, please advise and call patient back, they would like something called in today

## 2017-09-02 NOTE — Telephone Encounter (Signed)
PA approved, pharmacy notified.

## 2017-10-03 DIAGNOSIS — N200 Calculus of kidney: Secondary | ICD-10-CM | POA: Diagnosis not present

## 2017-10-03 DIAGNOSIS — N401 Enlarged prostate with lower urinary tract symptoms: Secondary | ICD-10-CM | POA: Diagnosis not present

## 2017-10-10 DIAGNOSIS — I10 Essential (primary) hypertension: Secondary | ICD-10-CM | POA: Diagnosis not present

## 2017-10-10 DIAGNOSIS — E119 Type 2 diabetes mellitus without complications: Secondary | ICD-10-CM | POA: Diagnosis not present

## 2017-10-10 DIAGNOSIS — N401 Enlarged prostate with lower urinary tract symptoms: Secondary | ICD-10-CM | POA: Diagnosis not present

## 2017-11-26 ENCOUNTER — Other Ambulatory Visit (INDEPENDENT_AMBULATORY_CARE_PROVIDER_SITE_OTHER): Payer: 59

## 2017-11-26 ENCOUNTER — Ambulatory Visit: Payer: 59 | Admitting: Internal Medicine

## 2017-11-26 ENCOUNTER — Ambulatory Visit (INDEPENDENT_AMBULATORY_CARE_PROVIDER_SITE_OTHER)
Admission: RE | Admit: 2017-11-26 | Discharge: 2017-11-26 | Disposition: A | Discharge status: Home / Self Care | Payer: 59 | Source: Ambulatory Visit | Attending: Internal Medicine | Admitting: Internal Medicine

## 2017-11-26 ENCOUNTER — Encounter: Payer: Self-pay | Admitting: Internal Medicine

## 2017-11-26 VITALS — BP 128/72 | HR 67 | Temp 98.1°F | Ht 69.0 in | Wt 203.0 lb

## 2017-11-26 DIAGNOSIS — R0600 Dyspnea, unspecified: Secondary | ICD-10-CM | POA: Diagnosis not present

## 2017-11-26 DIAGNOSIS — R0789 Other chest pain: Secondary | ICD-10-CM

## 2017-11-26 DIAGNOSIS — R0609 Other forms of dyspnea: Secondary | ICD-10-CM

## 2017-11-26 DIAGNOSIS — R1013 Epigastric pain: Secondary | ICD-10-CM

## 2017-11-26 DIAGNOSIS — E1121 Type 2 diabetes mellitus with diabetic nephropathy: Secondary | ICD-10-CM

## 2017-11-26 LAB — HEMOGLOBIN A1C: Hgb A1c MFr Bld: 6.5 % (ref 4.6–6.5)

## 2017-11-26 LAB — CBC WITH DIFFERENTIAL/PLATELET
BASOS PCT: 0.8 % (ref 0.0–3.0)
Basophils Absolute: 0 10*3/uL (ref 0.0–0.1)
EOS PCT: 1.5 % (ref 0.0–5.0)
Eosinophils Absolute: 0.1 10*3/uL (ref 0.0–0.7)
HCT: 45.9 % (ref 39.0–52.0)
HEMOGLOBIN: 15.2 g/dL (ref 13.0–17.0)
Lymphocytes Relative: 28.3 % (ref 12.0–46.0)
Lymphs Abs: 1.7 10*3/uL (ref 0.7–4.0)
MCHC: 33.2 g/dL (ref 30.0–36.0)
MCV: 96.6 fl (ref 78.0–100.0)
MONO ABS: 0.7 10*3/uL (ref 0.1–1.0)
MONOS PCT: 12.7 % — AB (ref 3.0–12.0)
Neutro Abs: 3.3 10*3/uL (ref 1.4–7.7)
Neutrophils Relative %: 56.7 % (ref 43.0–77.0)
Platelets: 128 10*3/uL — ABNORMAL LOW (ref 150.0–400.0)
RBC: 4.75 Mil/uL (ref 4.22–5.81)
RDW: 14.4 % (ref 11.5–15.5)
WBC: 5.8 10*3/uL (ref 4.0–10.5)

## 2017-11-26 LAB — URINALYSIS
Bilirubin Urine: NEGATIVE
Hgb urine dipstick: NEGATIVE
Leukocytes, UA: NEGATIVE
Nitrite: NEGATIVE
PH: 5.5 (ref 5.0–8.0)
Specific Gravity, Urine: >=1.03 — AB (ref 1.000–1.030)
Total Protein, Urine: NEGATIVE
URINE GLUCOSE: NEGATIVE
Urobilinogen, UA: 0.2 (ref 0.0–1.0)

## 2017-11-26 LAB — BRAIN NATRIURETIC PEPTIDE: PRO B NATRI PEPTIDE: 26 pg/mL (ref 0.0–100.0)

## 2017-11-26 LAB — SEDIMENTATION RATE: SED RATE: 1 mm/h (ref 0–20)

## 2017-11-26 MED ORDER — ASPIRIN EC 81 MG PO TBEC: 162.0000 mg | DELAYED_RELEASE_TABLET | Freq: Every day | ORAL | 3 refills | Status: AC

## 2017-11-26 MED ORDER — PANTOPRAZOLE SODIUM 40 MG PO TBEC: 40.0000 mg | DELAYED_RELEASE_TABLET | Freq: Every day | ORAL | 3 refills | Status: DC

## 2017-11-26 NOTE — Assessment & Plan Note (Addendum)
x6 weeks of ?etiology w/DOE EKG, CXR, labs Cardiol ref  ECHO ASA

## 2017-11-26 NOTE — Assessment & Plan Note (Signed)
Labs

## 2017-11-26 NOTE — Patient Instructions (Signed)
Go to ER if worse 

## 2017-11-26 NOTE — Assessment & Plan Note (Signed)
Protonix.  ?

## 2017-11-26 NOTE — Progress Notes (Signed)
Subjective:  Patient ID: Jacob Gordon, male    DOB: 1956-01-23  Age: 62 y.o. MRN: [id number]  CC: No chief complaint on file.   HPI Jacob Gordon presents for SOB w/taking 1 flight of steps x 6 wks. C/o palpitations at rest sometimes, fatigue. C/o GERD sx's  Outpatient Medications Prior to Visit  Medication Sig Dispense Refill  . amLODipine (NORVASC) 10 MG tablet TAKE 1 TABLET BY MOUTH EVERY DAY. NEEDS OFFICE VISIT FOR FURTHER REFILLS. 90 tablet 2  . atorvastatin (LIPITOR) 20 MG tablet Take 20 mg by mouth daily.    . Butalbital-Acetaminophen (BUPAP) 50-300 MG TABS Take 1 tablet by mouth 2 (two) times daily as needed. 60 tablet 2  . carvedilol (COREG) 25 MG tablet TAKE 1 TABLET BY MOUTH TWICE DAILY WITH A MEAL. NEEDS OFFICE VISIT FOR FURTHER REFILLS. 180 tablet 0  . Cholecalciferol (VITAMIN D3) 2000 units capsule Take 1 capsule (2,000 Units total) by mouth daily. 100 capsule 3  . clindamycin (CLEOCIN T) 1 % external solution Apply topically as directed. 90 mL 2  . clonazePAM (KLONOPIN) 0.5 MG tablet TAKE 1 TABLET BY MOUTH TWICE DAILY AS NEEDED FOR ANXIETY 60 tablet 0  . doxycycline (VIBRA-TABS) 100 MG tablet Take 1 tablet (100 mg total) by mouth 2 (two) times daily. 60 tablet 0  . loperamide (IMODIUM) 2 MG capsule Take 2 mg by mouth as needed for diarrhea or loose stools.    . naproxen sodium (ALEVE) 220 MG tablet Take 220 mg by mouth as needed.    . nortriptyline (PAMELOR) 10 MG capsule Take 2-3 capsules (20-30 mg total) by mouth at bedtime. 90 capsule 5  . tadalafil (CIALIS) 5 MG tablet Take 1 tablet (5 mg total) by mouth daily. Yearly physical is due in April must see Md for refills 30 tablet 11  . terazosin (HYTRIN) 2 MG capsule Take 8 mg by mouth at bedtime.     . TRADJENTA 5 MG TABS tablet TAKE 1 TABLET BY MOUTH  DAILY 90 tablet 3  . WELCHOL 625 MG tablet TAKE 3 TABLETS BY MOUTH  TWICE A DAY WITH A MEAL 540 tablet 3   No facility-administered medications prior to  visit.     ROS Review of Systems  Constitutional: Positive for fatigue. Negative for appetite change and unexpected weight change.  HENT: Negative for congestion, nosebleeds, sneezing, sore throat and trouble swallowing.   Eyes: Negative for itching and visual disturbance.  Respiratory: Positive for chest tightness and shortness of breath. Negative for cough.   Cardiovascular: Positive for chest pain and palpitations. Negative for leg swelling.  Gastrointestinal: Negative for abdominal distention, blood in stool, diarrhea and nausea.  Genitourinary: Negative for frequency and hematuria.  Musculoskeletal: Negative for back pain, gait problem, joint swelling and neck pain.  Skin: Negative for rash.  Neurological: Positive for weakness. Negative for dizziness, tremors and speech difficulty.  Psychiatric/Behavioral: Negative for agitation, dysphoric mood and sleep disturbance. The patient is not nervous/anxious.     Objective:  BP 128/72 (BP Location: Left Arm, Patient Position: Sitting, Cuff Size: Large)   Pulse 67   Temp 98.1 F (36.7 C) (Oral)   Ht 5\' 9"  (1.753 m)   Wt 203 lb (92.1 kg)   SpO2 99%   BMI 29.98 kg/m   BP Readings from Last 3 Encounters:  11/26/17 128/72  03/17/17 116/74  06/21/16 132/88    Wt Readings from Last 3 Encounters:  11/26/17 203 lb (92.1 kg)  03/17/17 195  lb (88.5 kg)  02/22/16 189 lb (85.7 kg)    Physical Exam  Constitutional: He is oriented to person, place, and time. He appears well-developed. No distress.  NAD  HENT:  Mouth/Throat: Oropharynx is clear and moist.  Eyes: Conjunctivae are normal. Pupils are equal, round, and reactive to light.  Neck: Normal range of motion. No JVD present. No thyromegaly present.  Cardiovascular: Normal rate, regular rhythm, normal heart sounds and intact distal pulses. Exam reveals no gallop and no friction rub.  No murmur heard. Pulmonary/Chest: Effort normal and breath sounds normal. No respiratory  distress. He has no wheezes. He has no rales. He exhibits no tenderness.  Abdominal: Soft. Bowel sounds are normal. He exhibits no distension and no mass. There is no tenderness. There is no rebound and no guarding.  Musculoskeletal: Normal range of motion. He exhibits no edema or tenderness.  Lymphadenopathy:    He has no cervical adenopathy.  Neurological: He is alert and oriented to person, place, and time. He has normal reflexes. No cranial nerve deficit. He exhibits normal muscle tone. He displays a negative Romberg sign. Coordination and gait normal.  Skin: Skin is warm and dry. No rash noted.  Psychiatric: He has a normal mood and affect. His behavior is normal. Judgment and thought content normal.  no JVD  Procedure: EKG Indication: chest pain, DOE Impression: NSR. No acute changes; old IWMI was present before  A complex case   Lab Results  Component Value Date   WBC 7.3 02/23/2016   HGB 15.6 02/23/2016   HCT 45.7 02/23/2016   PLT 117.0 (L) 02/23/2016   GLUCOSE 110 (H) 02/23/2016   CHOL 145 02/23/2016   TRIG 53.0 02/23/2016   HDL 53.90 02/23/2016   LDLDIRECT 184.1 10/20/2012   LDLCALC 80 02/23/2016   ALT 24 02/23/2016   AST 19 02/23/2016   NA 143 02/23/2016   K 3.9 02/23/2016   CL 107 02/23/2016   CREATININE 1.27 02/23/2016   BUN 16 02/23/2016   CO2 30 02/23/2016   TSH 1.53 02/23/2016   PSA 1.00 10/20/2014   HGBA1C 6.4 02/23/2016   MICROALBUR 0.7 11/21/2008    Ct Abdomen Pelvis W Contrast  Result Date: 06/12/2015 CLINICAL DATA:  Left flank pain and left lower quadrant tenderness. Diarrhea. EXAM: CT ABDOMEN AND PELVIS WITH CONTRAST TECHNIQUE: Multidetector CT imaging of the abdomen and pelvis was performed using the standard protocol following bolus administration of intravenous contrast. CONTRAST:  OMNIPAQUE IOHEXOL 300 MG/ML  SOLN COMPARISON:  CT scan dated 01/31/2010 FINDINGS: Lower chest:  Normal. Hepatobiliary: There several small benign cysts in the  liver the largest being 8 mm in diameter in the mid right lobe. Liver parenchyma and biliary tree are otherwise normal. Pancreas: Pancreatic parenchyma is normal. Slight chronic prominence of the pancreatic duct with a diameter of 2.5 mm. Pancreatic duct has a separate ampulla. Spleen: Normal. Adrenals/Urinary Tract: 5 and 9 mm cyst in the upper pole of the left kidney. 4 mm stone in the upper pole of the left kidney. 4 mm cyst in the lower pole of the left kidney. Otherwise normal. Stomach/Bowel: Normal. Vascular/Lymphatic: Aortic atherosclerosis.  No adenopathy. Reproductive: Normal. Other: No free air or free fluid. 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 Moderate arthritis of both hips, right more than left. IMPRESSION: No acute abnormality of the abdomen. Small stone in the upper pole of the left kidney. Electronically Signed   By: Francene Boyers M.D.   On: 06/12/2015 16:26  Assessment & Plan:   There are no diagnoses linked to this encounter. I am having Dahir M. Sookram maintain his Butalbital-Acetaminophen, nortriptyline, clonazePAM, amLODipine, carvedilol, atorvastatin, terazosin, loperamide, naproxen sodium, tadalafil, clindamycin, Vitamin D3, WELCHOL, TRADJENTA, and doxycycline.  No orders of the defined types were placed in this encounter.    Follow-up: No Follow-up on file.  Sonda PrimesAlex Plotnikov, MD

## 2017-11-26 NOTE — Assessment & Plan Note (Signed)
x6 weeks of ?etiology EKG, CXR, labs Cardiol ref  ECHO

## 2017-11-27 LAB — HEPATIC FUNCTION PANEL
ALT: 24 U/L (ref 0–53)
AST: 22 U/L (ref 0–37)
Albumin: 4.2 g/dL (ref 3.5–5.2)
Alkaline Phosphatase: 68 U/L (ref 39–117)
BILIRUBIN DIRECT: 0.2 mg/dL (ref 0.0–0.3)
BILIRUBIN TOTAL: 1.2 mg/dL (ref 0.2–1.2)
Total Protein: 6.8 g/dL (ref 6.0–8.3)

## 2017-11-27 LAB — BASIC METABOLIC PANEL
BUN: 24 mg/dL — AB (ref 6–23)
CALCIUM: 9.3 mg/dL (ref 8.4–10.5)
CO2: 27 meq/L (ref 19–32)
CREATININE: 1.32 mg/dL (ref 0.40–1.50)
Chloride: 105 mEq/L (ref 96–112)
GFR: 70.9 mL/min (ref >60.00)
GLUCOSE: 92 mg/dL (ref 70–99)
Potassium: 4.2 mEq/L (ref 3.5–5.1)
Sodium: 141 mEq/L (ref 135–145)

## 2017-11-27 LAB — CK: CK TOTAL: 152 U/L (ref 7–232)

## 2017-11-27 LAB — TSH: TSH: 1.17 u[IU]/mL (ref 0.35–4.50)

## 2017-11-27 LAB — D-DIMER, QUANTITATIVE (NOT AT ARMC): D DIMER QUANT: 0.28 ug{FEU}/mL (ref <0.50)

## 2017-11-27 LAB — VITAMIN B12: Vitamin B-12: >1500 pg/mL — ABNORMAL HIGH (ref 211–911)

## 2017-11-28 ENCOUNTER — Ambulatory Visit (HOSPITAL_COMMUNITY): Payer: 59 | Attending: Cardiology

## 2017-11-28 ENCOUNTER — Other Ambulatory Visit: Payer: Self-pay

## 2017-11-28 DIAGNOSIS — R0609 Other forms of dyspnea: Secondary | ICD-10-CM | POA: Diagnosis not present

## 2017-11-28 DIAGNOSIS — R0789 Other chest pain: Secondary | ICD-10-CM | POA: Diagnosis not present

## 2017-12-02 ENCOUNTER — Ambulatory Visit (INDEPENDENT_AMBULATORY_CARE_PROVIDER_SITE_OTHER): Payer: 59 | Admitting: Cardiology

## 2017-12-02 ENCOUNTER — Encounter: Payer: Self-pay | Admitting: Internal Medicine

## 2017-12-02 ENCOUNTER — Encounter: Payer: Self-pay | Admitting: Cardiology

## 2017-12-02 VITALS — BP 112/72 | HR 76 | Ht 69.0 in | Wt 203.1 lb

## 2017-12-02 DIAGNOSIS — I10 Essential (primary) hypertension: Secondary | ICD-10-CM | POA: Diagnosis not present

## 2017-12-02 DIAGNOSIS — R0609 Other forms of dyspnea: Secondary | ICD-10-CM | POA: Diagnosis not present

## 2017-12-02 DIAGNOSIS — R0789 Other chest pain: Secondary | ICD-10-CM | POA: Diagnosis not present

## 2017-12-02 NOTE — Progress Notes (Signed)
Cardiology Office Note:    Date:  12/02/2017   ID:  Jacob Gordon, DOB [birth year], MRN [id number]  PCP:  Tresa Garter, MD  Cardiologist:  No primary care provider on file.   Referring MD: Plotnikov, Georgina Quint, MD     History of Present Illness:    Jacob Gordon is a 62 y.o. male here for the evaluation of shortness of breath at the request of [name].  In review of prior office note, he has been noticing shortness of breath with one flights of stairs over the past month or 2.  Sometimes he can feel skipping of his heartbeat at rest, has felt some fatigue.  No obvious chest pain.  Has had some GERD-like symptoms.  Out of breath frequently, even after getting dressed. Can feel heart beating at those times. ? Angina.  Does complain of occasional chest pressure.  Echocardiogram was performed and showed normal ejection fraction 55-60% on 11/28/17.  EKG shows sinus rhythm with inferior Q waves.  Mother, older sister MI, younger brother with MI. Had a stress test 10 year.   Joint pain, challenge, plantar fasciatis. Right hip hip.   Past Medical History:  Diagnosis Date  . ALLERGIC RHINITIS   . Chronic headaches   . DEGENERATIVE DISC DISEASE   . DIABETES MELLITUS, TYPE II   . GERD   . History of kidney stones   . HLD (hyperlipidemia)   . HYPERTENSION   . Internal hemorrhoids   . OSTEOARTHRITIS   . THROMBOCYTOPENIA     Past Surgical History:  Procedure Laterality Date  . COLONOSCOPY  10/01/11   internal hemorrhoids  . SHOULDER SURGERY     right rotator cuff surg  . TONSILLECTOMY AND ADENOIDECTOMY    . VASECTOMY      Current Medications: Current Meds  Medication Sig  . amLODipine (NORVASC) 10 MG tablet TAKE 1 TABLET BY MOUTH EVERY DAY. NEEDS OFFICE VISIT FOR FURTHER REFILLS.  Marland Kitchen aspirin EC 81 MG tablet Take 2 tablets (162 mg total) by mouth daily.  Marland Kitchen atorvastatin (LIPITOR) 20 MG tablet Take 20 mg by mouth daily.  . Butalbital-Acetaminophen  (BUPAP) 50-300 MG TABS Take 1 tablet by mouth 2 (two) times daily as needed.  . carvedilol (COREG) 25 MG tablet TAKE 1 TABLET BY MOUTH TWICE DAILY WITH A MEAL. NEEDS OFFICE VISIT FOR FURTHER REFILLS.  Marland Kitchen Cholecalciferol (VITAMIN D3) 2000 units capsule Take 1 capsule (2,000 Units total) by mouth daily.  . clindamycin (CLEOCIN T) 1 % external solution Apply topically as directed.  . clonazePAM (KLONOPIN) 0.5 MG tablet TAKE 1 TABLET BY MOUTH TWICE DAILY AS NEEDED FOR ANXIETY  . doxycycline (VIBRA-TABS) 100 MG tablet Take 1 tablet (100 mg total) by mouth 2 (two) times daily.  Marland Kitchen loperamide (IMODIUM) 2 MG capsule Take 2 mg by mouth as needed for diarrhea or loose stools.  . naproxen sodium (ALEVE) 220 MG tablet Take 220 mg by mouth as needed.  . nortriptyline (PAMELOR) 10 MG capsule Take 2-3 capsules (20-30 mg total) by mouth at bedtime.  . pantoprazole (PROTONIX) 40 MG tablet Take 1 tablet (40 mg total) by mouth daily.  . tadalafil (CIALIS) 5 MG tablet Take 1 tablet (5 mg total) by mouth daily. Yearly physical is due in April must see Md for refills  . terazosin (HYTRIN) 2 MG capsule Take 8 mg by mouth at bedtime.   . TRADJENTA 5 MG TABS tablet TAKE 1 TABLET BY MOUTH  DAILY  . [facility]  625 MG tablet TAKE 3 TABLETS BY MOUTH  TWICE A DAY WITH A MEAL     Allergies:   Amlodipine-atorvastatin; Pravastatin sodium; Sulfa antibiotics; Tape; and Valsartan   Social History   Socioeconomic History  . Marital status: Married    Spouse name: None  . Number of children: 1  . Years of education: None  . Highest education level: None  Social Needs  . Financial resource strain: None  . Food insecurity - worry: None  . Food insecurity - inability: None  . Transportation needs - medical: None  . Transportation needs - non-medical: None  Occupational History  . Occupation: Designer, industrial/product   Tobacco Use  . Smoking status: Never Smoker  . Smokeless tobacco: Never Used  Substance and Sexual Activity  .  Alcohol use: No  . Drug use: No  . Sexual activity: None  Other Topics Concern  . None  Social History Narrative   Caffeine daily      Family History: The patient's family history includes Diabetes in his sister. There is no history of Colon cancer.  ROS:   Please see the history of present illness.     All other systems reviewed and are negative.  EKGs/Labs/Other Studies Reviewed:    The following studies were reviewed today: Prior office notes, EKG, echocardiogram reviewed.  EF is normal.  EKG: As above.  Normal sinus rhythm with inferior Q waves.  Heart rate in the 60s.  Recent Labs: 11/26/2017: ALT 24; BUN 24; Creatinine, Ser 1.32; Hemoglobin 15.2; Platelets 128.0; Potassium 4.2; Pro B Natriuretic peptide (BNP) 26.0; Sodium 141; TSH 1.17  Recent Lipid Panel    Component Value Date/Time   CHOL 145 02/23/2016 0735   TRIG 53.0 02/23/2016 0735   HDL 53.90 02/23/2016 0735   CHOLHDL 3 02/23/2016 0735   VLDL 10.6 02/23/2016 0735   LDLCALC 80 02/23/2016 0735   LDLDIRECT 184.1 10/20/2012 0747    Physical Exam:    VS:  BP 112/72   Pulse 76   Ht 5\' 9"  (1.753 m)   Wt 203 lb 1.9 oz (92.1 kg)   SpO2 97%   BMI 30.00 kg/m     Wt Readings from Last 3 Encounters:  12/02/17 203 lb 1.9 oz (92.1 kg)  11/26/17 203 lb (92.1 kg)  03/17/17 195 lb (88.5 kg)     GEN:  Well nourished, well developed in no acute distress HEENT: Normal NECK: No JVD; No carotid bruits LYMPHATICS: No lymphadenopathy CARDIAC: RRR, no murmurs, rubs, gallops RESPIRATORY:  Clear to auscultation without rales, wheezing or rhonchi  ABDOMEN: Soft, non-tender, non-distended MUSCULOSKELETAL:  No edema; No deformity  SKIN: Warm and dry NEUROLOGIC:  Alert and oriented x 3 PSYCHIATRIC:  Normal affect   ASSESSMENT:    1. DOE (dyspnea on exertion)   2. Essential hypertension   3. Chest pressure    PLAN:    In order of problems listed above:  Atypical shortness of breath, possible anginal  equivalent -He is diabetic with a family history of CAD, normal EF.  Recent increase in shortness of breath with activity.  It does not take much for him to start to feel significantly out of breath.  I think it would be wise for [place] to evaluate him with a CT scan of his coronaries to make sure he does not have any significant flow-limiting coronary artery disease.  He is not able to run on the treadmill because of severe plantar fasciitis hip pain.  Echocardiogram reassuring.  Hemoglobin  15.2.  CRP is normal.  LDL 80 on low-dose atorvastatin.  Creatinine 1.32.  Stable for scan.  BNP is normal at 26.  D-dimer is normal at 0.28.  We will follow-up with results of testing.  If CT scan is unremarkable consider pulmonary evaluation.   Medication Adjustments/Labs and Tests Ordered: Current medicines are reviewed at length with the patient today.  Concerns regarding medicines are outlined above.  Orders Placed This Encounter  Procedures  . CT CORONARY MORPH W/CTA COR W/SCORE W/CA W/CM &/OR WO/CM  . CT CORONARY FRACTIONAL FLOW RESERVE DATA PREP  . CT CORONARY FRACTIONAL FLOW RESERVE FLUID ANALYSIS   No orders of the defined types were placed in this encounter.   Signed, Donato SchultzMark Rayden Dock, MD  12/02/2017 4:22 PM    [facility]

## 2017-12-02 NOTE — Patient Instructions (Addendum)
Medication Instructions:  The current medical regimen is effective;  continue present plan and medications.  Labwork: None  Testing/Procedures: Your physician has requested that you have cardiac CT. Cardiac computed tomography (CT) is a painless test that uses an x-ray machine to take clear, detailed pictures of your heart. For further information please visit [email or web address]. Please follow instruction sheet as given.  Follow-Up: Follow up will be determine after the above testing.  Thank you for choosing [facility]!!    Please [facility] at TBD AM (30-45 minutes prior to test start time)  [facility] [address] [place], [place] [address] [phone]  Proceed to the [facility] Radiology Department (First Floor).  Please follow these instructions carefully (unless otherwise directed):  Hold all erectile dysfunction medications at least 48 hours prior to test.  On the Night Before the Test: . Drink plenty of water. . Do not consume any caffeinated/decaffeinated beverages or chocolate 12 hours prior to your test. . Do not take any antihistamines 12 hours prior to your test. . If you take Metformin do not take 24 hours prior to test.   On the Day of the Test: . Drink plenty of water. Do not drink any water within one hour of the test. . Do not eat any food 4 hours prior to the test. . You may take your regular medications prior to the test. . IF NOT ON A BETA BLOCKER - Take 50 mg of lopressor (metoprolol) one hour before the test. . HOLD Furosemide morning of the test.  After the Test: . Drink plenty of water. . After receiving IV contrast, you may experience a mild flushed feeling. This is normal. . On occasion, you may experience a mild rash up to 24 hours after the test. This is not dangerous. If this occurs, you can take Benadryl 25 mg and increase your fluid intake. . If you  experience trouble breathing, this can be serious. If it is severe call 911 IMMEDIATELY. If it is mild, please call our office. . If you take any of these medications: Glipizide/Metformin, Avandament, Glucavance, please do not take 48 hours after completing test.

## 2017-12-12 DIAGNOSIS — E119 Type 2 diabetes mellitus without complications: Secondary | ICD-10-CM | POA: Diagnosis not present

## 2017-12-12 DIAGNOSIS — N401 Enlarged prostate with lower urinary tract symptoms: Secondary | ICD-10-CM | POA: Diagnosis not present

## 2017-12-12 DIAGNOSIS — N200 Calculus of kidney: Secondary | ICD-10-CM | POA: Diagnosis not present

## 2017-12-24 DIAGNOSIS — E119 Type 2 diabetes mellitus without complications: Secondary | ICD-10-CM | POA: Diagnosis not present

## 2017-12-24 DIAGNOSIS — Z1211 Encounter for screening for malignant neoplasm of colon: Secondary | ICD-10-CM | POA: Diagnosis not present

## 2017-12-30 ENCOUNTER — Ambulatory Visit (HOSPITAL_COMMUNITY)
Admission: RE | Admit: 2017-12-30 | Discharge: 2017-12-30 | Disposition: A | Discharge status: Home / Self Care | Payer: 59 | Source: Ambulatory Visit | Attending: Cardiology | Admitting: Cardiology

## 2017-12-30 DIAGNOSIS — R0602 Shortness of breath: Secondary | ICD-10-CM | POA: Diagnosis present

## 2017-12-30 DIAGNOSIS — I2510 Atherosclerotic heart disease of native coronary artery without angina pectoris: Secondary | ICD-10-CM | POA: Diagnosis not present

## 2017-12-30 DIAGNOSIS — R0609 Other forms of dyspnea: Secondary | ICD-10-CM | POA: Insufficient documentation

## 2017-12-30 DIAGNOSIS — I10 Essential (primary) hypertension: Secondary | ICD-10-CM | POA: Diagnosis not present

## 2017-12-30 DIAGNOSIS — R0789 Other chest pain: Secondary | ICD-10-CM | POA: Diagnosis not present

## 2017-12-30 DIAGNOSIS — E119 Type 2 diabetes mellitus without complications: Secondary | ICD-10-CM | POA: Diagnosis present

## 2017-12-30 MED ORDER — NITROGLYCERIN 0.4 MG SL SUBL
0.8000 mg | SUBLINGUAL_TABLET | Freq: Once | SUBLINGUAL | Status: AC
  Administered 2017-12-30: 0.8 mg via SUBLINGUAL
  Filled 2017-12-30: qty 25

## 2017-12-30 MED ORDER — IOPAMIDOL (ISOVUE-370) INJECTION 76%
INTRAVENOUS | Status: AC
  Administered 2017-12-30: 80 mL via INTRAVENOUS
  Filled 2017-12-30: qty 100

## 2017-12-30 MED ORDER — NITROGLYCERIN 0.4 MG SL SUBL
SUBLINGUAL_TABLET | SUBLINGUAL | Status: AC
  Filled 2017-12-30: qty 2

## 2018-01-02 ENCOUNTER — Encounter: Payer: Self-pay | Admitting: Cardiology

## 2018-01-02 ENCOUNTER — Ambulatory Visit: Payer: 59 | Admitting: Cardiology

## 2018-01-02 VITALS — BP 102/80 | HR 75 | Ht 69.0 in | Wt 202.0 lb

## 2018-01-02 DIAGNOSIS — R931 Abnormal findings on diagnostic imaging of heart and coronary circulation: Secondary | ICD-10-CM

## 2018-01-02 DIAGNOSIS — E785 Hyperlipidemia, unspecified: Secondary | ICD-10-CM

## 2018-01-02 DIAGNOSIS — I10 Essential (primary) hypertension: Secondary | ICD-10-CM

## 2018-01-02 DIAGNOSIS — E119 Type 2 diabetes mellitus without complications: Secondary | ICD-10-CM | POA: Diagnosis not present

## 2018-01-02 DIAGNOSIS — I209 Angina pectoris, unspecified: Secondary | ICD-10-CM

## 2018-01-02 LAB — PROTIME-INR
INR: 1.1 (ref 0.8–1.2)
Prothrombin Time: 11.4 s (ref 9.1–12.0)

## 2018-01-02 LAB — BASIC METABOLIC PANEL
BUN / CREAT RATIO: 10 (ref 10–24)
BUN: 14 mg/dL (ref 8–27)
CO2: 24 mmol/L (ref 20–29)
CREATININE: 1.34 mg/dL — AB (ref 0.76–1.27)
Calcium: 9.4 mg/dL (ref 8.6–10.2)
Chloride: 104 mmol/L (ref 96–106)
GFR, EST AFRICAN AMERICAN: 66 mL/min/{1.73_m2} (ref >59)
GFR, EST NON AFRICAN AMERICAN: 57 mL/min/{1.73_m2} — AB (ref >59)
Glucose: 127 mg/dL — ABNORMAL HIGH (ref 65–99)
POTASSIUM: 4.2 mmol/L (ref 3.5–5.2)
SODIUM: 142 mmol/L (ref 134–144)

## 2018-01-02 LAB — CBC
Hematocrit: 45.4 % (ref 37.5–51.0)
Hemoglobin: 16 g/dL (ref 13.0–17.7)
MCH: 32.4 pg (ref 26.6–33.0)
MCHC: 35.2 g/dL (ref 31.5–35.7)
MCV: 92 fL (ref 79–97)
PLATELETS: 151 10*3/uL (ref 150–379)
RBC: 4.94 x10E6/uL (ref 4.14–5.80)
RDW: 14.4 % (ref 12.3–15.4)
WBC: 6.3 10*3/uL (ref 3.4–10.8)

## 2018-01-02 NOTE — H&P (View-Only) (Signed)
Cardiology Office Note:    Date:  01/02/2018   ID:  Jacob Gordon, DOB 09/15/1956, MRN [id number]  PCP:  [name], [name], MD  Cardiologist:  No primary care provider on file.   Referring MD: [name], [name], MD     History of Present Illness:    Jacob DawleyLinwood Matthew Mattioli is a 62 y.o. male here for the follow up of CT of cors deomstrating abnormal FFR analysis of mid to distal LAD indicative of flow obstructive coronary artery disease.  This was performed because of anginal equivalent shortness of breath at the request of Dr. Posey ReaPlotnikov.  In review of prior office note, he has been noticing shortness of breath with one flights of stairs over the past month or 2.  Sometimes he can feel skipping of his heartbeat at rest, has felt some fatigue.  No obvious chest pain.  Has had some GERD-like symptoms.  Out of breath frequently, even after getting dressed. Can feel heart beating at those times. ? Angina.  Does complain of occasional chest pressure.  Echocardiogram was performed and showed normal ejection fraction 55-60% on 11/28/17.  EKG shows sinus rhythm with inferior Q waves.  Mother, older sister MI, younger brother with MI. Had a stress test 10 year.   Joint pain, challenge, plantar fasciatis. Right hip hip.   CT scan results were discussed as below.  Past Medical History:  Diagnosis Date  . ALLERGIC RHINITIS   . Chronic headaches   . DEGENERATIVE DISC DISEASE   . DIABETES MELLITUS, TYPE II   . GERD   . History of kidney stones   . HLD (hyperlipidemia)   . HYPERTENSION   . Internal hemorrhoids   . OSTEOARTHRITIS   . THROMBOCYTOPENIA     Past Surgical History:  Procedure Laterality Date  . COLONOSCOPY  10/01/11   internal hemorrhoids  . SHOULDER SURGERY     right rotator cuff surg  . TONSILLECTOMY AND ADENOIDECTOMY    . VASECTOMY      Current Medications: Current Meds  Medication Sig  . amLODipine (NORVASC) 10 MG tablet TAKE 1 TABLET BY MOUTH EVERY DAY.  NEEDS OFFICE VISIT FOR FURTHER REFILLS.  Marland Kitchen. aspirin EC 81 MG tablet Take 2 tablets (162 mg total) by mouth daily.  Marland Kitchen. atorvastatin (LIPITOR) 20 MG tablet Take 20 mg by mouth daily.  . Butalbital-Acetaminophen (BUPAP) 50-300 MG TABS Take 1 tablet by mouth 2 (two) times daily as needed.  . carvedilol (COREG) 25 MG tablet TAKE 1 TABLET BY MOUTH TWICE DAILY WITH A MEAL. NEEDS OFFICE VISIT FOR FURTHER REFILLS.  Marland Kitchen. Cholecalciferol (VITAMIN D3) 2000 units capsule Take 1 capsule (2,000 Units total) by mouth daily.  . clindamycin (CLEOCIN T) 1 % external solution Apply topically as directed.  . clonazePAM (KLONOPIN) 0.5 MG tablet TAKE 1 TABLET BY MOUTH TWICE DAILY AS NEEDED FOR ANXIETY  . doxycycline (VIBRA-TABS) 100 MG tablet Take 1 tablet (100 mg total) by mouth 2 (two) times daily.  Marland Kitchen. loperamide (IMODIUM) 2 MG capsule Take 2 mg by mouth as needed for diarrhea or loose stools.  . naproxen sodium (ALEVE) 220 MG tablet Take 220 mg by mouth as needed.  . nortriptyline (PAMELOR) 10 MG capsule Take 2-3 capsules (20-30 mg total) by mouth at bedtime.  . pantoprazole (PROTONIX) 40 MG tablet Take 1 tablet (40 mg total) by mouth daily.  . tadalafil (CIALIS) 5 MG tablet Take 1 tablet (5 mg total) by mouth daily. Yearly physical is due in April must  see Md for refills  . terazosin (HYTRIN) 2 MG capsule Take 8 mg by mouth at bedtime.   . TRADJENTA 5 MG TABS tablet TAKE 1 TABLET BY MOUTH  DAILY  . WELCHOL 625 MG tablet TAKE 3 TABLETS BY MOUTH  TWICE A DAY WITH A MEAL     Allergies:   Tape; Sulfa antibiotics; Valsartan; Amlodipine-atorvastatin; and Pravastatin sodium   Social History   Socioeconomic History  . Marital status: Married    Spouse name: None  . Number of children: 1  . Years of education: None  . Highest education level: None  Social Needs  . Financial resource strain: None  . Food insecurity - worry: None  . Food insecurity - inability: None  . Transportation needs - medical: None  .  Transportation needs - non-medical: None  Occupational History  . Occupation: Designer, industrial/product   Tobacco Use  . Smoking status: Never Smoker  . Smokeless tobacco: Never Used  Substance and Sexual Activity  . Alcohol use: No  . Drug use: No  . Sexual activity: None  Other Topics Concern  . None  Social History Narrative   Caffeine daily      Family History: The patient's family history includes Diabetes in his sister. There is no history of Colon cancer.  ROS:   Please see the history of present illness.    All other review of systems negative  EKGs/Labs/Other Studies Reviewed:    The following studies were reviewed today: Prior office notes, EKG, echocardiogram reviewed.  EF is normal.  CT cors 12/30/17 Aorta:  Normal size.  No calcifications.  No dissection.  Aortic Valve:  Trileaflet.  No calcifications.  Coronary Arteries:  Normal coronary origin.  Right dominance.  RCA is a large dominant artery that gives rise to PDA and a small PLA. There is mild diffuse predominantly calcified plaque with associated stenosis 25-50%.  Left main is a large artery that gives rise to LAD and LCX arteries. Left main has minimal non-calcified plaque with associated stenosis 0-25%.  LAD is a large vessel that gives rise to three diagonal arteries. There is mild diffuse mixed plaque in the proximal LAD with associated stenosis 25-50%. Mid LAD has a moderate mixed, predominantly calcified plaque with associated stenosis 50-69%. Distal LAD has a long calcified plaque but a small lumen.  D1 is medium size and has diffuse plaque with associated stenosis 50-69%.  D2 is small and has severe ostial stenosis > 70%.  D3 is small and has mild plaque only.  LCX is a tortuous non-dominant artery that gives rise to one OM1 branch. There is mild diffuse calcified plaque with associated stenosis 25-50%.  OM1 has diffuse mixed plaque with associated stenosis 25-50%.  Other  findings:  Normal pulmonary vein drainage into the left atrium.  Normal let atrial appendage without a thrombus.  Normal size of the pulmonary artery.  IMPRESSION: 1. Coronary calcium score of 493. This was 56 percentile for age and sex matched control.  2. Normal coronary origin with right dominance.  3. Mild CAD in the RCA, LCX and proximal LAD, moderate CAD in the mid LAD and in D1, severe stenosis in the ostial D2. Additional analysis with CT FFR will be performed.   Electronically Signed   By: Jacob Gordon   On: 12/30/2017 18:01  CT FFR analysis: FINDINGS: FFRct analysis was performed on the original cardiac CT angiogram dataset. Diagrammatic representation of the FFRct analysis is provided in a separate PDF document in  PACS. This dictation was created using the PDF document and an interactive 3D model of the results. 3D model is not available in the EMR/PACS. Normal FFR range is >0.80.  1. Left Main:  No significant stenosis.  2. LAD: Proximal CT FFR: 0.97, mid CT FFR: 0.86, distal CT FFR: 0.79. 3. D2: No significant stenosis, CT FFR 0.95. 4. LCX: No significant stenosis. 5. RCA: No significant stenosis.  IMPRESSION: 1. CT FFR analysis showed hemodynamically significant stenosis in the distal LAD.   Electronically Signed   By: Jacob Gordon   On: 12/31/2017 08:23  ECHO 11/28/17: - Left ventricle: The cavity size was normal. Wall thickness was   normal. Systolic function was normal. The estimated ejection   fraction was in the range of 55% to 60%.  EKG: As above.  01/02/18 - NSR, small inferior Q no changes, personally viewed. Prior Normal sinus rhythm with inferior Q waves.  Heart rate in the 60s.  Recent Labs: 11/26/2017: ALT 24; BUN 24; Creatinine, Ser 1.32; Hemoglobin 15.2; Platelets 128.0; Potassium 4.2; Pro B Natriuretic peptide (BNP) 26.0; Sodium 141; TSH 1.17  Recent Lipid Panel    Component Value Date/Time   CHOL 145  02/23/2016 0735   TRIG 53.0 02/23/2016 0735   HDL 53.90 02/23/2016 0735   CHOLHDL 3 02/23/2016 0735   VLDL 10.6 02/23/2016 0735   LDLCALC 80 02/23/2016 0735   LDLDIRECT 184.1 10/20/2012 0747    Physical Exam:    VS:  BP 102/80   Pulse 75   Ht 5\' 9"  (1.753 m)   Wt 202 lb (91.6 kg)   SpO2 96%   BMI 29.83 kg/m     Wt Readings from Last 3 Encounters:  01/02/18 202 lb (91.6 kg)  12/02/17 203 lb 1.9 oz (92.1 kg)  11/26/17 203 lb (92.1 kg)     GEN: Well nourished, well developed, in no acute distress  HEENT: normal  Neck: no JVD, carotid bruits, or masses Cardiac: RRR; no murmurs, rubs, or gallops,no edema  Respiratory:  clear to auscultation bilaterally, normal work of breathing GI: soft, nontender, nondistended, + BS MS: no deformity or atrophy  Skin: warm and dry, no rash Neuro:  Alert and Oriented x 3, Strength and sensation are intact Psych: euthymic mood, full affect   ASSESSMENT:    1. Abnormal cardiac CT angiography    PLAN:    In order of problems listed above:  Abnormal CT scan demonstrating coronary artery disease with significant flow limitation in the mid to distal LAD by FFR analysis -We will proceed with cardiac catheterization.  Risks and benefits have been discussed including stroke heart attack death renal impairment bleeding.  Angina - Is shortness of breath certainly could be an anginal equivalent given his CT analysis of flow-limiting mid to distal LAD.  Previous echocardiogram shows reassuring ejection fraction.  Hemoglobin 15, CRP normal, LDL 80 on low-dose atorvastatin (increased to high intensity dose 40), creatinine 1.3, d-dimer normal, BMP normal at 26.  Diabetes with hypertension - Carvedilol, amlodipine, no changes, per primary team  Hyperlipidemia -Continue increased  atorvastatin 40 from 20.  Diabetes.    Medication Adjustments/Labs and Tests Ordered: Current medicines are reviewed at length with the patient today.  Concerns regarding  medicines are outlined above.  Orders Placed This Encounter  Procedures  . Basic metabolic panel  . CBC  . Protime-INR  . EKG 12-Lead   No orders of the defined types were placed in this encounter.   Signed, Donato Schultz,  MD  01/02/2018 8:51 AM    [facility]

## 2018-01-02 NOTE — Patient Instructions (Signed)
Medication Instructions:  Your physician recommends that you continue on your current medications as directed. Please refer to the Current Medication list given to you today.   Labwork: TODAY: CBC, BMET, PT/INR  Testing/Procedures: Your physician has requested that you have a cardiac catheterization on 01/07/18. Cardiac catheterization is used to diagnose and/or treat various heart conditions. Doctors may recommend this procedure for a number of different reasons. The most common reason is to evaluate chest pain. Chest pain can be a symptom of coronary artery disease (CAD), and cardiac catheterization can show whether plaque is narrowing or blocking your heart's arteries. This procedure is also used to evaluate the valves, as well as measure the blood flow and oxygen levels in different parts of your heart. For further information please visit [email or web address] Please follow instruction sheet, as given.    Follow-Up: Your physician recommends that you schedule a follow-up appointment 2 weeks after heart catheterization with APP on Dr. Judd Gaudier team.    Any Other Special Instructions Will Be Listed Below (If Applicable).    [facility] CARDIOVASCULAR DIVISION [facility] ST OFFICE [address], [address] [place] [address] Dept: [phone] Loc: [phone]  Jacob Gordon  01/02/2018  You are scheduled for a Cardiac Catheterization on Wednesday, March 6 with Dr. Verne Carrow.  1. Please arrive at the [facility] (Main Entrance A) at [facility]: [address] [place], [place] [address] at 6:30 AM (two hours before your procedure to ensure your preparation). Free valet parking service is available.   Special note: Every effort is made to have your procedure done on time. Please understand that emergencies sometimes delay scheduled procedures.  2. Diet: Do not eat or drink anything after midnight prior to your  procedure except sips of water to take medications.  3. Labs: TODAY  4. Medication instructions in preparation for your procedure:  DO NOT take your Tradjenta the morning of the procedure  On the morning of your procedure, take your Aspirin and any morning medicines NOT listed above.  You may use sips of water.  5. Plan for one night stay--bring personal belongings. 6. Bring a current list of your medications and current insurance cards. 7. You MUST have a responsible person to drive you home. 8. Someone MUST be with you the first 24 hours after you arrive home or your discharge will be delayed. 9. Please wear clothes that are easy to get on and off and wear slip-on shoes.  Thank you for allowing [place] to care for you!   -- [facility]    If you need a refill on your cardiac medications before your next appointment, please call your pharmacy.   Coronary Angiogram A coronary angiogram is an X-ray procedure that is used to examine the arteries in the heart. In this procedure, a dye (contrast dye) is injected through a long, thin tube (catheter). The catheter is inserted through the groin, wrist, or arm. The dye is injected into each artery, then X-rays are taken to show if there is a blockage in the arteries of the heart. This procedure can also show if you have valve disease or a disease of the aorta, and it can be used to check the overall function of your heart muscle. You may have a coronary angiogram if:  You are having chest pain, or other symptoms of angina, and you are at risk for heart disease.  You have an abnormal electrocardiogram (ECG)  or stress test.  You have chest pain and heart failure.  You are having irregular heart rhythms.  You and your health care provider determine that the benefits of the test information outweigh the risks of the procedure.  Let your health care provider know about:  Any allergies you have, including  allergies to contrast dye.  All medicines you are taking, including vitamins, herbs, eye drops, creams, and over-the-counter medicines.  Any problems you or family members have had with anesthetic medicines.  Any blood disorders you have.  Any surgeries you have had.  History of kidney problems or kidney failure.  Any medical conditions you have.  Whether you are pregnant or may be pregnant. What are the risks? Generally, this is a safe procedure. However, problems may occur, including:  Infection.  Allergic reaction to medicines or dyes that are used.  Bleeding from the access site or other locations.  Kidney injury, especially in people with impaired kidney function.  Stroke (rare).  Heart attack (rare).  Damage to other structures or organs.  What happens before the procedure? Staying hydrated Follow instructions from your health care provider about hydration, which may include:  Up to 2 hours before the procedure - you may continue to drink clear liquids, such as water, clear fruit juice, black coffee, and plain tea.  Eating and drinking restrictions Follow instructions from your health care provider about eating and drinking, which may include:  8 hours before the procedure - stop eating heavy meals or foods such as meat, fried foods, or fatty foods.  6 hours before the procedure - stop eating light meals or foods, such as toast or cereal.  2 hours before the procedure - stop drinking clear liquids.  General instructions  Ask your health care provider about: ? Changing or stopping your regular medicines. This is especially important if you are taking diabetes medicines or blood thinners. ? Taking medicines such as ibuprofen. These medicines can thin your blood. Do not take these medicines before your procedure if your health care provider instructs you not to, though aspirin may be recommended prior to coronary angiograms.  Plan to have someone take you home  from the hospital or clinic.  You may need to have blood tests or X-rays done. What happens during the procedure?  An IV tube will be inserted into one of your veins.  You will be given one or more of the following: ? A medicine to help you relax (sedative). ? A medicine to numb the area where the catheter will be inserted into an artery (local anesthetic).  To reduce your risk of infection: ? Your health care team will wash or sanitize their hands. ? Your skin will be washed with soap. ? Hair may be removed from the area where the catheter will be inserted.  You will be connected to a continuous ECG monitor.  The catheter will be inserted into an artery. The location may be in your groin, in your wrist, or in the fold of your arm (near your elbow).  A type of X-ray (fluoroscopy) will be used to help guide the catheter to the opening of the blood vessel that is being examined.  A dye will be injected into the catheter, and X-rays will be taken. The dye will help to show where any narrowing or blockages are located in the heart arteries.  Tell your health care provider if you have any chest pain or trouble breathing during the procedure.  If blockages are  found, your health care provider may perform another procedure, such as inserting a coronary stent. The procedure may vary among health care providers and hospitals. What happens after the procedure?  After the procedure, you will need to keep the area still for a few hours, or for as long as told by your health care provider. If the procedure is done through the groin, you will be instructed to not bend and not cross your legs.  The insertion site will be checked frequently.  The pulse in your foot or wrist will be checked frequently.  You may have additional blood tests, X-rays, and a test that records the electrical activity of your heart (ECG).  Do not drive for 24 hours if you were given a sedative. Summary  A coronary  angiogram is an X-ray procedure that is used to look into the arteries in the heart.  During the procedure, a dye (contrast dye) is injected through a long, thin tube (catheter). The catheter is inserted through the groin, wrist, or arm.  Tell your health care provider about any allergies you have, including allergies to contrast dye.  After the procedure, you will need to keep the area still for a few hours, or for as long as told by your health care provider. This information is not intended to replace advice given to you by your health care provider. Make sure you discuss any questions you have with your health care provider. Document Released: 04/27/2003 Document Revised: 08/02/2016 Document Reviewed: 08/02/2016 Elsevier Interactive Patient Education  [employer]2018 [employer]

## 2018-01-02 NOTE — Progress Notes (Signed)
Cardiology Office Note:    Date:  01/02/2018   ID:  Jacob Gordon, DOB 09/15/1956, MRN [id number]  PCP:  [name], [name], MD  Cardiologist:  No primary care provider on file.   Referring MD: [name], [name], MD     History of Present Illness:    Jacob Gordon is a 62 y.o. male here for the follow up of CT of cors deomstrating abnormal FFR analysis of mid to distal LAD indicative of flow obstructive coronary artery disease.  This was performed because of anginal equivalent shortness of breath at the request of Dr. Posey ReaPlotnikov.  In review of prior office note, he has been noticing shortness of breath with one flights of stairs over the past month or 2.  Sometimes he can feel skipping of his heartbeat at rest, has felt some fatigue.  No obvious chest pain.  Has had some GERD-like symptoms.  Out of breath frequently, even after getting dressed. Can feel heart beating at those times. ? Angina.  Does complain of occasional chest pressure.  Echocardiogram was performed and showed normal ejection fraction 55-60% on 11/28/17.  EKG shows sinus rhythm with inferior Q waves.  Mother, older sister MI, younger brother with MI. Had a stress test 10 year.   Joint pain, challenge, plantar fasciatis. Right hip hip.   CT scan results were discussed as below.  Past Medical History:  Diagnosis Date  . ALLERGIC RHINITIS   . Chronic headaches   . DEGENERATIVE DISC DISEASE   . DIABETES MELLITUS, TYPE II   . GERD   . History of kidney stones   . HLD (hyperlipidemia)   . HYPERTENSION   . Internal hemorrhoids   . OSTEOARTHRITIS   . THROMBOCYTOPENIA     Past Surgical History:  Procedure Laterality Date  . COLONOSCOPY  10/01/11   internal hemorrhoids  . SHOULDER SURGERY     right rotator cuff surg  . TONSILLECTOMY AND ADENOIDECTOMY    . VASECTOMY      Current Medications: Current Meds  Medication Sig  . amLODipine (NORVASC) 10 MG tablet TAKE 1 TABLET BY MOUTH EVERY DAY.  NEEDS OFFICE VISIT FOR FURTHER REFILLS.  Marland Kitchen. aspirin EC 81 MG tablet Take 2 tablets (162 mg total) by mouth daily.  Marland Kitchen. atorvastatin (LIPITOR) 20 MG tablet Take 20 mg by mouth daily.  . Butalbital-Acetaminophen (BUPAP) 50-300 MG TABS Take 1 tablet by mouth 2 (two) times daily as needed.  . carvedilol (COREG) 25 MG tablet TAKE 1 TABLET BY MOUTH TWICE DAILY WITH A MEAL. NEEDS OFFICE VISIT FOR FURTHER REFILLS.  Marland Kitchen. Cholecalciferol (VITAMIN D3) 2000 units capsule Take 1 capsule (2,000 Units total) by mouth daily.  . clindamycin (CLEOCIN T) 1 % external solution Apply topically as directed.  . clonazePAM (KLONOPIN) 0.5 MG tablet TAKE 1 TABLET BY MOUTH TWICE DAILY AS NEEDED FOR ANXIETY  . doxycycline (VIBRA-TABS) 100 MG tablet Take 1 tablet (100 mg total) by mouth 2 (two) times daily.  Marland Kitchen. loperamide (IMODIUM) 2 MG capsule Take 2 mg by mouth as needed for diarrhea or loose stools.  . naproxen sodium (ALEVE) 220 MG tablet Take 220 mg by mouth as needed.  . nortriptyline (PAMELOR) 10 MG capsule Take 2-3 capsules (20-30 mg total) by mouth at bedtime.  . pantoprazole (PROTONIX) 40 MG tablet Take 1 tablet (40 mg total) by mouth daily.  . tadalafil (CIALIS) 5 MG tablet Take 1 tablet (5 mg total) by mouth daily. Yearly physical is due in April must  see Md for refills  . terazosin (HYTRIN) 2 MG capsule Take 8 mg by mouth at bedtime.   . TRADJENTA 5 MG TABS tablet TAKE 1 TABLET BY MOUTH  DAILY  . WELCHOL 625 MG tablet TAKE 3 TABLETS BY MOUTH  TWICE A DAY WITH A MEAL     Allergies:   Tape; Sulfa antibiotics; Valsartan; Amlodipine-atorvastatin; and Pravastatin sodium   Social History   Socioeconomic History  . Marital status: Married    Spouse name: None  . Number of children: 1  . Years of education: None  . Highest education level: None  Social Needs  . Financial resource strain: None  . Food insecurity - worry: None  . Food insecurity - inability: None  . Transportation needs - medical: None  .  Transportation needs - non-medical: None  Occupational History  . Occupation: Designer, industrial/product   Tobacco Use  . Smoking status: Never Smoker  . Smokeless tobacco: Never Used  Substance and Sexual Activity  . Alcohol use: No  . Drug use: No  . Sexual activity: None  Other Topics Concern  . None  Social History Narrative   Caffeine daily      Family History: The patient's family history includes Diabetes in his sister. There is no history of Colon cancer.  ROS:   Please see the history of present illness.    All other review of systems negative  EKGs/Labs/Other Studies Reviewed:    The following studies were reviewed today: Prior office notes, EKG, echocardiogram reviewed.  EF is normal.  CT cors 12/30/17 Aorta:  Normal size.  No calcifications.  No dissection.  Aortic Valve:  Trileaflet.  No calcifications.  Coronary Arteries:  Normal coronary origin.  Right dominance.  RCA is a large dominant artery that gives rise to PDA and a small PLA. There is mild diffuse predominantly calcified plaque with associated stenosis 25-50%.  Left main is a large artery that gives rise to LAD and LCX arteries. Left main has minimal non-calcified plaque with associated stenosis 0-25%.  LAD is a large vessel that gives rise to three diagonal arteries. There is mild diffuse mixed plaque in the proximal LAD with associated stenosis 25-50%. Mid LAD has a moderate mixed, predominantly calcified plaque with associated stenosis 50-69%. Distal LAD has a long calcified plaque but a small lumen.  D1 is medium size and has diffuse plaque with associated stenosis 50-69%.  D2 is small and has severe ostial stenosis > 70%.  D3 is small and has mild plaque only.  LCX is a tortuous non-dominant artery that gives rise to one OM1 branch. There is mild diffuse calcified plaque with associated stenosis 25-50%.  OM1 has diffuse mixed plaque with associated stenosis 25-50%.  Other  findings:  Normal pulmonary vein drainage into the left atrium.  Normal let atrial appendage without a thrombus.  Normal size of the pulmonary artery.  IMPRESSION: 1. Coronary calcium score of 493. This was 56 percentile for age and sex matched control.  2. Normal coronary origin with right dominance.  3. Mild CAD in the RCA, LCX and proximal LAD, moderate CAD in the mid LAD and in D1, severe stenosis in the ostial D2. Additional analysis with CT FFR will be performed.   Electronically Signed   By: Tobias Alexander   On: 12/30/2017 18:01  CT FFR analysis: FINDINGS: FFRct analysis was performed on the original cardiac CT angiogram dataset. Diagrammatic representation of the FFRct analysis is provided in a separate PDF document in  PACS. This dictation was created using the PDF document and an interactive 3D model of the results. 3D model is not available in the EMR/PACS. Normal FFR range is >0.80.  1. Left Main:  No significant stenosis.  2. LAD: Proximal CT FFR: 0.97, mid CT FFR: 0.86, distal CT FFR: 0.79. 3. D2: No significant stenosis, CT FFR 0.95. 4. LCX: No significant stenosis. 5. RCA: No significant stenosis.  IMPRESSION: 1. CT FFR analysis showed hemodynamically significant stenosis in the distal LAD.   Electronically Signed   By: Tobias Alexander   On: 12/31/2017 08:23  ECHO 11/28/17: - Left ventricle: The cavity size was normal. Wall thickness was   normal. Systolic function was normal. The estimated ejection   fraction was in the range of 55% to 60%.  EKG: As above.  01/02/18 - NSR, small inferior Q no changes, personally viewed. Prior Normal sinus rhythm with inferior Q waves.  Heart rate in the 60s.  Recent Labs: 11/26/2017: ALT 24; BUN 24; Creatinine, Ser 1.32; Hemoglobin 15.2; Platelets 128.0; Potassium 4.2; Pro B Natriuretic peptide (BNP) 26.0; Sodium 141; TSH 1.17  Recent Lipid Panel    Component Value Date/Time   CHOL 145  02/23/2016 0735   TRIG 53.0 02/23/2016 0735   HDL 53.90 02/23/2016 0735   CHOLHDL 3 02/23/2016 0735   VLDL 10.6 02/23/2016 0735   LDLCALC 80 02/23/2016 0735   LDLDIRECT 184.1 10/20/2012 0747    Physical Exam:    VS:  BP 102/80   Pulse 75   Ht 5\' 9"  (1.753 m)   Wt 202 lb (91.6 kg)   SpO2 96%   BMI 29.83 kg/m     Wt Readings from Last 3 Encounters:  01/02/18 202 lb (91.6 kg)  12/02/17 203 lb 1.9 oz (92.1 kg)  11/26/17 203 lb (92.1 kg)     GEN: Well nourished, well developed, in no acute distress  HEENT: normal  Neck: no JVD, carotid bruits, or masses Cardiac: RRR; no murmurs, rubs, or gallops,no edema  Respiratory:  clear to auscultation bilaterally, normal work of breathing GI: soft, nontender, nondistended, + BS MS: no deformity or atrophy  Skin: warm and dry, no rash Neuro:  Alert and Oriented x 3, Strength and sensation are intact Psych: euthymic mood, full affect   ASSESSMENT:    1. Abnormal cardiac CT angiography    PLAN:    In order of problems listed above:  Abnormal CT scan demonstrating coronary artery disease with significant flow limitation in the mid to distal LAD by FFR analysis -We will proceed with cardiac catheterization.  Risks and benefits have been discussed including stroke heart attack death renal impairment bleeding.  Angina - Is shortness of breath certainly could be an anginal equivalent given his CT analysis of flow-limiting mid to distal LAD.  Previous echocardiogram shows reassuring ejection fraction.  Hemoglobin 15, CRP normal, LDL 80 on low-dose atorvastatin (increased to high intensity dose 40), creatinine 1.3, d-dimer normal, BMP normal at 26.  Diabetes with hypertension - Carvedilol, amlodipine, no changes, per primary team  Hyperlipidemia -Continue increased  atorvastatin 40 from 20.  Diabetes.    Medication Adjustments/Labs and Tests Ordered: Current medicines are reviewed at length with the patient today.  Concerns regarding  medicines are outlined above.  Orders Placed This Encounter  Procedures  . Basic metabolic panel  . CBC  . Protime-INR  . EKG 12-Lead   No orders of the defined types were placed in this encounter.   Signed, Donato Schultz,  MD  01/02/2018 8:51 AM    [facility]

## 2018-01-06 ENCOUNTER — Telehealth: Payer: Self-pay | Admitting: *Deleted

## 2018-01-06 NOTE — Telephone Encounter (Signed)
Pt [facility]: Wednesday January 07, 2018 8:30 AM Verified arrival [facility] and [facility]: [facility] Entr[facility] A/[facility] at: 6:30 AM Nothing to eat or drink after midnight prior to cath.  Hold:  Tradjenta AM of cath No tadalafil prior to cath  Except hold medications AM meds can be  taken pre-cath with sip of water including: ASA 81 mg   Confirmed patient has responsible person to drive home post procedure and observe patient for 24 hours: yes

## 2018-01-07 ENCOUNTER — Ambulatory Visit (HOSPITAL_COMMUNITY)
Admission: RE | Disposition: A | Discharge status: Home / Self Care | Payer: Self-pay | Source: Ambulatory Visit | Attending: Cardiovascular Disease

## 2018-01-07 ENCOUNTER — Ambulatory Visit (HOSPITAL_COMMUNITY)
Admission: RE | Admit: 2018-01-07 | Discharge: 2018-01-07 | Disposition: A | Discharge status: Home / Self Care | Payer: 59 | Source: Ambulatory Visit | Attending: Cardiovascular Disease | Admitting: Cardiovascular Disease

## 2018-01-07 ENCOUNTER — Encounter (HOSPITAL_COMMUNITY): Payer: Self-pay | Admitting: Cardiovascular Disease

## 2018-01-07 DIAGNOSIS — E119 Type 2 diabetes mellitus without complications: Secondary | ICD-10-CM | POA: Diagnosis not present

## 2018-01-07 DIAGNOSIS — I2584 Coronary atherosclerosis due to calcified coronary lesion: Secondary | ICD-10-CM | POA: Insufficient documentation

## 2018-01-07 DIAGNOSIS — I10 Essential (primary) hypertension: Secondary | ICD-10-CM | POA: Insufficient documentation

## 2018-01-07 DIAGNOSIS — Z7982 Long term (current) use of aspirin: Secondary | ICD-10-CM | POA: Diagnosis not present

## 2018-01-07 DIAGNOSIS — M1990 Unspecified osteoarthritis, unspecified site: Secondary | ICD-10-CM | POA: Insufficient documentation

## 2018-01-07 DIAGNOSIS — Z8249 Family history of ischemic heart disease and other diseases of the circulatory system: Secondary | ICD-10-CM | POA: Insufficient documentation

## 2018-01-07 DIAGNOSIS — K219 Gastro-esophageal reflux disease without esophagitis: Secondary | ICD-10-CM | POA: Diagnosis not present

## 2018-01-07 DIAGNOSIS — E785 Hyperlipidemia, unspecified: Secondary | ICD-10-CM | POA: Diagnosis not present

## 2018-01-07 DIAGNOSIS — I25110 Atherosclerotic heart disease of native coronary artery with unstable angina pectoris: Secondary | ICD-10-CM | POA: Insufficient documentation

## 2018-01-07 DIAGNOSIS — Z882 Allergy status to sulfonamides status: Secondary | ICD-10-CM | POA: Diagnosis not present

## 2018-01-07 DIAGNOSIS — R931 Abnormal findings on diagnostic imaging of heart and coronary circulation: Secondary | ICD-10-CM

## 2018-01-07 DIAGNOSIS — I2510 Atherosclerotic heart disease of native coronary artery without angina pectoris: Secondary | ICD-10-CM

## 2018-01-07 HISTORY — PX: LEFT HEART CATH AND CORONARY ANGIOGRAPHY: CATH118249

## 2018-01-07 LAB — HM COLONOSCOPY

## 2018-01-07 LAB — GLUCOSE, CAPILLARY: GLUCOSE-CAPILLARY: 110 mg/dL — AB (ref 65–99)

## 2018-01-07 SURGERY — LEFT HEART CATH AND CORONARY ANGIOGRAPHY
Anesthesia: LOCAL

## 2018-01-07 MED ORDER — SODIUM CHLORIDE 0.9 % IV SOLN: 250.0000 mL | INTRAVENOUS | Status: DC | PRN

## 2018-01-07 MED ORDER — IOPAMIDOL (ISOVUE-370) INJECTION 76%
INTRAVENOUS | Status: AC
  Filled 2018-01-07: qty 100

## 2018-01-07 MED ORDER — FENTANYL CITRATE (PF) 100 MCG/2ML IJ SOLN
INTRAMUSCULAR | Status: DC | PRN
  Administered 2018-01-07: 25 ug via INTRAVENOUS

## 2018-01-07 MED ORDER — MIDAZOLAM HCL 2 MG/2ML IJ SOLN
INTRAMUSCULAR | Status: DC | PRN
  Administered 2018-01-07: 1 mg via INTRAVENOUS

## 2018-01-07 MED ORDER — SODIUM CHLORIDE 0.9% FLUSH: 3.0000 mL | INTRAVENOUS | Status: DC | PRN

## 2018-01-07 MED ORDER — VERAPAMIL HCL 2.5 MG/ML IV SOLN
INTRAVENOUS | Status: DC | PRN
  Administered 2018-01-07: 09:00:00 via INTRA_ARTERIAL

## 2018-01-07 MED ORDER — HEPARIN (PORCINE) IN NACL 2-0.9 UNIT/ML-% IJ SOLN
INTRAMUSCULAR | Status: AC | PRN
  Administered 2018-01-07 (×2): 500 mL via INTRA_ARTERIAL

## 2018-01-07 MED ORDER — HEPARIN (PORCINE) IN NACL 2-0.9 UNIT/ML-% IJ SOLN
INTRAMUSCULAR | Status: AC
  Filled 2018-01-07: qty 1000

## 2018-01-07 MED ORDER — SODIUM CHLORIDE 0.9 % WEIGHT BASED INFUSION
3.0000 mL/kg/h | INTRAVENOUS | Status: AC
  Administered 2018-01-07: 3 mL/kg/h via INTRAVENOUS

## 2018-01-07 MED ORDER — SODIUM CHLORIDE 0.9% FLUSH: 3.0000 mL | Freq: Two times a day (BID) | INTRAVENOUS | Status: DC

## 2018-01-07 MED ORDER — SODIUM CHLORIDE 0.9 % WEIGHT BASED INFUSION: 1.0000 mL/kg/h | INTRAVENOUS | Status: DC

## 2018-01-07 MED ORDER — MIDAZOLAM HCL 2 MG/2ML IJ SOLN
INTRAMUSCULAR | Status: AC
  Filled 2018-01-07: qty 2

## 2018-01-07 MED ORDER — HEPARIN SODIUM (PORCINE) 1000 UNIT/ML IJ SOLN
INTRAMUSCULAR | Status: AC
  Filled 2018-01-07: qty 1

## 2018-01-07 MED ORDER — FENTANYL CITRATE (PF) 100 MCG/2ML IJ SOLN
INTRAMUSCULAR | Status: AC
  Filled 2018-01-07: qty 2

## 2018-01-07 MED ORDER — LIDOCAINE HCL (PF) 1 % IJ SOLN
INTRAMUSCULAR | Status: DC | PRN
  Administered 2018-01-07: 2 mL

## 2018-01-07 MED ORDER — ASPIRIN 81 MG PO CHEW: 81.0000 mg | CHEWABLE_TABLET | ORAL | Status: DC

## 2018-01-07 MED ORDER — SODIUM CHLORIDE 0.9 % IV SOLN: INTRAVENOUS | Status: AC

## 2018-01-07 MED ORDER — VERAPAMIL HCL 2.5 MG/ML IV SOLN
INTRAVENOUS | Status: AC
Start: 2018-01-07 — End: ?
  Filled 2018-01-07: qty 2

## 2018-01-07 MED ORDER — IOPAMIDOL (ISOVUE-370) INJECTION 76%
INTRAVENOUS | Status: DC | PRN
  Administered 2018-01-07: 90 mL via INTRA_ARTERIAL

## 2018-01-07 MED ORDER — HEPARIN SODIUM (PORCINE) 1000 UNIT/ML IJ SOLN
INTRAMUSCULAR | Status: DC | PRN
  Administered 2018-01-07: 4500 [IU] via INTRAVENOUS

## 2018-01-07 SURGICAL SUPPLY — 10 items
CATH IMPULSE 5F ANG/FL3.5 (CATHETERS) ×1 IMPLANT
DEVICE RAD COMP TR BAND LRG (VASCULAR PRODUCTS) ×1 IMPLANT
GLIDESHEATH SLEND SS 6F .021 (SHEATH) ×1 IMPLANT
GUIDEWIRE INQWIRE 1.5J.035X260 (WIRE) IMPLANT
INQWIRE 1.5J .035X260CM (WIRE) ×2
KIT HEART LEFT (KITS) ×2 IMPLANT
PACK CARDIAC CATHETERIZATION (CUSTOM PROCEDURE TRAY) ×2 IMPLANT
SYR MEDRAD MARK V 150ML (SYRINGE) ×2 IMPLANT
TRANSDUCER W/STOPCOCK (MISCELLANEOUS) ×2 IMPLANT
TUBING CIL FLEX 10 FLL-RA (TUBING) ×2 IMPLANT

## 2018-01-07 NOTE — Discharge Instructions (Signed)

## 2018-01-07 NOTE — Interval H&P Note (Signed)
History and Physical Interval Note:  01/07/2018 8:11 AM  Jacob Toya SmothersMatthew Fewell  has presented today for cardiac cath with the diagnosis of CAD/unstable angina. The various methods of treatment have been discussed with the patient and family. After consideration of risks, benefits and other options for treatment, the patient has consented to  Procedure(s): LEFT HEART CATH AND CORONARY ANGIOGRAPHY (N/A) as a surgical intervention .  The patient's history has been reviewed, patient examined, no change in status, stable for surgery.  I have reviewed the patient's chart and labs.  Questions were answered to the patient's satisfaction.    Cath Lab Visit (complete for each Cath Lab visit)  Clinical Evaluation Leading to the Procedure:   ACS: No.  Non-ACS:    Anginal Classification: CCS III  Anti-ischemic medical therapy: Maximal Therapy (2 or more classes of medications)  Non-Invasive Test Results: No non-invasive testing performed  Prior CABG: No previous CABG         Verne Carrowhristopher Huberta Tompkins

## 2018-01-08 MED FILL — Heparin Sodium (Porcine) 2 Unit/ML in Sodium Chloride 0.9%: INTRAMUSCULAR | Qty: 1000 | Status: AC

## 2018-01-16 DIAGNOSIS — N401 Enlarged prostate with lower urinary tract symptoms: Secondary | ICD-10-CM | POA: Diagnosis not present

## 2018-01-16 DIAGNOSIS — N200 Calculus of kidney: Secondary | ICD-10-CM | POA: Diagnosis not present

## 2018-01-22 ENCOUNTER — Encounter: Payer: Self-pay | Admitting: *Deleted

## 2018-01-26 DIAGNOSIS — M19011 Primary osteoarthritis, right shoulder: Secondary | ICD-10-CM | POA: Diagnosis not present

## 2018-02-02 ENCOUNTER — Encounter: Payer: Self-pay | Admitting: Cardiology

## 2018-02-02 ENCOUNTER — Ambulatory Visit: Payer: 59 | Admitting: Cardiology

## 2018-02-02 VITALS — BP 120/76 | HR 73 | Ht 69.0 in | Wt 206.1 lb

## 2018-02-02 DIAGNOSIS — E785 Hyperlipidemia, unspecified: Secondary | ICD-10-CM | POA: Diagnosis not present

## 2018-02-02 DIAGNOSIS — I2510 Atherosclerotic heart disease of native coronary artery without angina pectoris: Secondary | ICD-10-CM | POA: Diagnosis not present

## 2018-02-02 DIAGNOSIS — R0609 Other forms of dyspnea: Secondary | ICD-10-CM

## 2018-02-02 DIAGNOSIS — I209 Angina pectoris, unspecified: Secondary | ICD-10-CM

## 2018-02-02 DIAGNOSIS — Z79899 Other long term (current) drug therapy: Secondary | ICD-10-CM

## 2018-02-02 MED ORDER — ATORVASTATIN CALCIUM 40 MG PO TABS: 40.0000 mg | ORAL_TABLET | Freq: Every day | ORAL | 3 refills | Status: DC

## 2018-02-02 NOTE — Addendum Note (Signed)
Addended by: Sharin GraveFLEMING, Ashey Tramontana J on: 02/02/2018 12:17 PM   Modules accepted: Orders

## 2018-02-02 NOTE — Progress Notes (Signed)
Cardiology Office Note:    Date:  02/02/2018   ID:  Jacob Gordon, DOB 11/10/1955, MRN [id number]  PCP:  Tresa Garter, MD  Cardiologist:  Donato Schultz, MD   Referring MD: Tresa Garter, MD     History of Present Illness:    Jacob Gordon is a 62 y.o. male here for the follow up of moderate coronary artery disease nonobstructive by cardiac catheterization in March 2019 after receiving a CT of cors deomstrating abnormal FFR analysis of mid to distal LAD indicative of flow obstructive coronary artery disease.  This was performed because of anginal equivalent shortness of breath at the request of [name].  In review of prior office note, he has been noticing shortness of breath with one flights of stairs over the past month or 2.  Sometimes he can feel skipping of his heartbeat at rest, has felt some fatigue.  No obvious chest pain.  Has had some GERD-like symptoms.  Out of breath frequently, even after getting dressed. Can feel heart beating at those times. ? Angina.  Does complain of occasional chest pressure.  Echocardiogram was performed and showed normal ejection fraction 55-60% on 11/28/17.  EKG shows sinus rhythm with inferior Q waves.  Mother, older sister MI, younger brother with MI. Had a stress test 10 year.   Joint pain, challenge, plantar fasciatis. Right hip hip.   CT scan results were discussed as below.  02/02/18 - SOB with stairs is really unchanged, he does not like to push himself up the stairs because he knows that he will be winded at the top.  No real pressure.  No bleeding, no syncope.  He was pleased with the results of the catheterization overall.  He does take once daily Cialis for urinary incontinence and this would prohibit [place] from using isosorbide.  We discussed potential Ranexa.   Past Medical History:  Diagnosis Date  . ALLERGIC RHINITIS   . Chronic headaches   . DEGENERATIVE DISC DISEASE   . DIABETES MELLITUS, TYPE II   .  GERD   . History of kidney stones   . HLD (hyperlipidemia)   . HYPERTENSION   . Internal hemorrhoids   . OSTEOARTHRITIS   . THROMBOCYTOPENIA     Past Surgical History:  Procedure Laterality Date  . COLONOSCOPY  10/01/11   internal hemorrhoids  . LEFT HEART CATH AND CORONARY ANGIOGRAPHY N/A 01/07/2018   Procedure: LEFT HEART CATH AND CORONARY ANGIOGRAPHY;  Surgeon: Kathleene Hazel, MD;  Location: [facility];  Service: Cardiovascular;  Laterality: N/A;  . SHOULDER SURGERY     right rotator cuff surg  . TONSILLECTOMY AND ADENOIDECTOMY    . VASECTOMY      Current Medications: Current Meds  Medication Sig  . amLODipine (NORVASC) 10 MG tablet TAKE 1 TABLET BY MOUTH EVERY DAY. NEEDS OFFICE VISIT FOR FURTHER REFILLS. (Patient taking differently: TAKE 10 MG BY MOUTH EVERY DAY)  . aspirin EC 81 MG tablet Take 2 tablets (162 mg total) by mouth daily.  Marland Kitchen atorvastatin (LIPITOR) 40 MG tablet Take 1 tablet (40 mg total) by mouth daily.  . Butalbital-Acetaminophen (BUPAP) 50-300 MG TABS Take 1 tablet by mouth 2 (two) times daily as needed.  . carvedilol (COREG) 25 MG tablet TAKE 1 TABLET BY MOUTH TWICE DAILY WITH A MEAL. NEEDS OFFICE VISIT FOR FURTHER REFILLS. (Patient taking differently: TAKE 25 MG BY MOUTH TWICE DAILY WITH A MEAL)  . Cholecalciferol (VITAMIN D3) 2000 units capsule Take  1 capsule (2,000 Units total) by mouth daily.  . clindamycin (CLEOCIN T) 1 % external solution Apply topically as directed. (Patient taking differently: Apply 1 application topically daily as needed (for skin). )  . clonazePAM (KLONOPIN) 0.5 MG tablet TAKE 1 TABLET BY MOUTH TWICE DAILY AS NEEDED FOR ANXIETY  . naproxen sodium (ALEVE) 220 MG tablet Take 440 mg by mouth daily as needed (for pain or headache).   . nortriptyline (PAMELOR) 10 MG capsule Take 2-3 capsules (20-30 mg total) by mouth at bedtime.  Marland Kitchen OVER THE COUNTER MEDICATION Take 1 capsule by mouth daily. Suprema Dophilus Supplement  .  pantoprazole (PROTONIX) 40 MG tablet Take 1 tablet (40 mg total) by mouth daily.  . tadalafil (CIALIS) 5 MG tablet Take 1 tablet (5 mg total) by mouth daily. Yearly physical is due in April must see Md for refills  . terazosin (HYTRIN) 5 MG capsule Take 5 mg by mouth daily.   . TRADJENTA 5 MG TABS tablet TAKE 1 TABLET BY MOUTH  DAILY (Patient taking differently: TAKE 5 MG BY MOUTH  DAILY)  . WELCHOL 625 MG tablet TAKE 3 TABLETS BY MOUTH  TWICE A DAY WITH A MEAL (Patient taking differently: TAKE 1875 MG BY MOUTH  TWICE A DAY WITH A MEAL)  . [DISCONTINUED] atorvastatin (LIPITOR) 20 MG tablet Take 20 mg by mouth daily.     Allergies:   Tape; Sulfa antibiotics; Valsartan; Amlodipine-atorvastatin; and Pravastatin sodium   Social History   Socioeconomic History  . Marital status: Married    Spouse name: Not on file  . Number of children: 1  . Years of education: Not on file  . Highest education level: Not on file  Occupational History  . Occupation: Designer, industrial/product   Social Needs  . Financial resource strain: Not on file  . Food insecurity:    Worry: Not on file    Inability: Not on file  . Transportation needs:    Medical: Not on file    Non-medical: Not on file  Tobacco Use  . Smoking status: Never Smoker  . Smokeless tobacco: Never Used  Substance and Sexual Activity  . Alcohol use: No  . Drug use: No  . Sexual activity: Not on file  Lifestyle  . Physical activity:    Days per week: Not on file    Minutes per session: Not on file  . Stress: Not on file  Relationships  . Social connections:    Talks on phone: Not on file    Gets together: Not on file    Attends religious service: Not on file    Active member of club or organization: Not on file    Attends meetings of clubs or organizations: Not on file    Relationship status: Not on file  Other Topics Concern  . Not on file  Social History Narrative   Caffeine daily      Family History: The patient's family history  includes Diabetes in his sister. There is no history of Colon cancer.  ROS:   Please see the history of present illness.    All other review of systems negative EKGs/Labs/Other Studies Reviewed:    The following studies were reviewed today: Prior office notes, EKG, echocardiogram reviewed.  EF is normal.  CT cors 12/30/17 Aorta:  Normal size.  No calcifications.  No dissection.  Aortic Valve:  Trileaflet.  No calcifications.  Coronary Arteries:  Normal coronary origin.  Right dominance.  RCA is a large dominant artery  that gives rise to PDA and a small PLA. There is mild diffuse predominantly calcified plaque with associated stenosis 25-50%.  Left main is a large artery that gives rise to LAD and LCX arteries. Left main has minimal non-calcified plaque with associated stenosis 0-25%.  LAD is a large vessel that gives rise to three diagonal arteries. There is mild diffuse mixed plaque in the proximal LAD with associated stenosis 25-50%. Mid LAD has a moderate mixed, predominantly calcified plaque with associated stenosis 50-69%. Distal LAD has a long calcified plaque but a small lumen.  D1 is medium size and has diffuse plaque with associated stenosis 50-69%.  D2 is small and has severe ostial stenosis > 70%.  D3 is small and has mild plaque only.  LCX is a tortuous non-dominant artery that gives rise to one OM1 branch. There is mild diffuse calcified plaque with associated stenosis 25-50%.  OM1 has diffuse mixed plaque with associated stenosis 25-50%.  Other findings:  Normal pulmonary vein drainage into the left atrium.  Normal let atrial appendage without a thrombus.  Normal size of the pulmonary artery.  IMPRESSION: 1. Coronary calcium score of 493. This was 76 percentile for age and sex matched control.  2. Normal coronary origin with right dominance.  3. Mild CAD in the RCA, LCX and proximal LAD, moderate CAD in the mid LAD and in D1,  severe stenosis in the ostial D2. Additional analysis with CT FFR will be performed.   Electronically Signed   By: Tobias Alexander   On: 12/30/2017 18:01  CT FFR analysis: FINDINGS: FFRct analysis was performed on the original cardiac CT angiogram dataset. Diagrammatic representation of the FFRct analysis is provided in a separate PDF document in PACS. This dictation was created using the PDF document and an interactive 3D model of the results. 3D model is not available in the EMR/PACS. Normal FFR range is >0.80.  1. Left Main:  No significant stenosis.  2. LAD: Proximal CT FFR: 0.97, mid CT FFR: 0.86, distal CT FFR: 0.79. 3. D2: No significant stenosis, CT FFR 0.95. 4. LCX: No significant stenosis. 5. RCA: No significant stenosis.  IMPRESSION: 1. CT FFR analysis showed hemodynamically significant stenosis in the distal LAD.   Electronically Signed   By: Tobias Alexander   On: 12/31/2017 08:23  ECHO 11/28/17: - Left ventricle: The cavity size was normal. Wall thickness was   normal. Systolic function was normal. The estimated ejection   fraction was in the range of 55% to 60%.  Cardiac catheterization 01/07/18:  Prox RCA lesion is 40% stenosed.  Mid RCA lesion is 40% stenosed.  Dist RCA lesion is 40% stenosed.  Ost RPDA to RPDA lesion is 30% stenosed.  Prox Cx to Mid Cx lesion is 30% stenosed.  Ost 2nd Mrg to 2nd Mrg lesion is 30% stenosed.  3rd Mrg lesion is 30% stenosed.  Ost 3rd Diag lesion is 50% stenosed.  Ost 1st Diag lesion is 99% stenosed.  Dist LM to Prox LAD lesion is 20% stenosed.  Mid LAD lesion is 30% stenosed.  Dist LAD lesion is 60% stenosed.  The left ventricular systolic function is normal.  LV end diastolic pressure is normal.  The left ventricular ejection fraction is 55-65% by visual estimate.  There is no mitral valve regurgitation.   1. Moderate non-obstructive CAD 2. There are no flow limiting lesions seen in the  RCA, Circumflex or LAD 3. There is a small caliber branch that arises as a septal perforator  and then courses to the lateral wall in the distribution of a diagonal branch. There is a severe stenosis in this branch. The branch is too small for PCI/stenting.  4. Normal LV systolic function  Recommendations: I would continue medical management of CAD with aggressive risk factor modification. The small diagonal branch could certainly cause angina. I do not think I would approach this branch with PCI at this time given the size of the vessel.    EKG: As above.  01/02/18 - NSR, small inferior Q no changes, personally viewed. Prior Normal sinus rhythm with inferior Q waves.  Heart rate in the 60s.  Recent Labs: 11/26/2017: ALT 24; Pro B Natriuretic peptide (BNP) 26.0; TSH 1.17 01/02/2018: BUN 14; Creatinine, Ser 1.34; Hemoglobin 16.0; Platelets 151; Potassium 4.2; Sodium 142  Recent Lipid Panel    Component Value Date/Time   CHOL 145 02/23/2016 0735   TRIG 53.0 02/23/2016 0735   HDL 53.90 02/23/2016 0735   CHOLHDL 3 02/23/2016 0735   VLDL 10.6 02/23/2016 0735   LDLCALC 80 02/23/2016 0735   LDLDIRECT 184.1 10/20/2012 0747    Physical Exam:    VS:  BP 120/76   Pulse 73   Ht 5\' 9"  (1.753 m)   Wt 206 lb 1.9 oz (93.5 kg)   SpO2 97%   BMI 30.44 kg/m     Wt Readings from Last 3 Encounters:  02/02/18 206 lb 1.9 oz (93.5 kg)  01/07/18 195 lb (88.5 kg)  01/02/18 202 lb (91.6 kg)     GEN: Well nourished, well developed, in no acute distress  HEENT: normal  Neck: no JVD, carotid bruits, or masses Cardiac: RRR; no murmurs, rubs, or gallops,no edema  Respiratory:  clear to auscultation bilaterally, normal work of breathing GI: soft, nontender, nondistended, + BS MS: no deformity or atrophy  Skin: warm and dry, no rash Neuro:  Alert and Oriented x 3, Strength and sensation are intact Psych: euthymic mood, full affect   ASSESSMENT:    1. Angina pectoris (HCC)    PLAN:    In order of  problems listed above:  Moderate nonobstructive coronary artery disease-continuation with medical management after cardiac catheterization on 01/07/18.  Abnormal CT scan demonstrating coronary artery disease with significant flow limitation in the mid to distal LAD by FFR analysis.  Catheterization revealed only moderate disease as above.  Continue with aggressive medical management, secondary prevention.  We will increase his atorvastatin from 20-40 mg.  We can consider Ranexa in the future.  Cannot use isosorbide because of daily Cialis.   Angina - His shortness of breath certainly could be an anginal equivalent, however there was nonobstructive CAD on cath.  Continue with medical management.  Continue to encourage conditioning.  Previous echocardiogram shows reassuring ejection fraction.  Hemoglobin 15, CRP normal, LDL 80 on low-dose atorvastatin (increased to high intensity dose 40), creatinine 1.3, d-dimer normal, BMP normal at 26.  Diabetes with hypertension - Carvedilol, amlodipine, no changes, per primary team  Hyperlipidemia -Continue increased  atorvastatin 40 from 20.  Continue to monitor.    Medication Adjustments/Labs and Tests Ordered: Current medicines are reviewed at length with the patient today.  Concerns regarding medicines are outlined above.  No orders of the defined types were placed in this encounter.  Meds ordered this encounter  Medications  . atorvastatin (LIPITOR) 40 MG tablet    Sig: Take 1 tablet (40 mg total) by mouth daily.    Dispense:  90 tablet    Refill:  3    Signed, Donato Schultz, MD  02/02/2018 12:09 PM    [facility]

## 2018-02-02 NOTE — Patient Instructions (Addendum)
Medication Instructions:  Please increase your Atorvastatin to 40 mg a day. Continue all other medications as listed.  Please have fasting lab work in 2 months. (Lipid/ALT)  Follow-Up: Follow up in 6 months with Dr. Anne FuSkains.  You will receive a letter in the mail 2 months before you are due.  Please call us when you receive this letter to schedule your follow up appointment.  If you need a refill on your cardiac medications before your next appointment, please call your pharmacy.  Thank you for choosing [facility]!!

## 2018-03-02 DIAGNOSIS — M79642 Pain in left hand: Secondary | ICD-10-CM | POA: Diagnosis not present

## 2018-03-02 DIAGNOSIS — M65842 Other synovitis and tenosynovitis, left hand: Secondary | ICD-10-CM | POA: Diagnosis not present

## 2018-03-03 DIAGNOSIS — M75121 Complete rotator cuff tear or rupture of right shoulder, not specified as traumatic: Secondary | ICD-10-CM | POA: Diagnosis not present

## 2018-03-03 DIAGNOSIS — Y929 Unspecified place or not applicable: Secondary | ICD-10-CM | POA: Diagnosis not present

## 2018-03-03 DIAGNOSIS — S46119A Strain of muscle, fascia and tendon of long head of biceps, unspecified arm, initial encounter: Secondary | ICD-10-CM | POA: Diagnosis not present

## 2018-03-17 DIAGNOSIS — M25511 Pain in right shoulder: Secondary | ICD-10-CM | POA: Diagnosis not present

## 2018-03-19 ENCOUNTER — Telehealth: Payer: Self-pay

## 2018-03-19 NOTE — Telephone Encounter (Signed)
Key: [id number]   Started today via Cover My Meds.

## 2018-03-24 NOTE — Telephone Encounter (Signed)
PA approved 09/01/17-09/01/18

## 2018-03-25 ENCOUNTER — Encounter: Payer: Self-pay | Admitting: Internal Medicine

## 2018-03-26 MED ORDER — TADALAFIL 5 MG PO TABS: 5.0000 mg | ORAL_TABLET | Freq: Every day | ORAL | 11 refills | Status: DC

## 2018-04-06 ENCOUNTER — Other Ambulatory Visit: Payer: 59 | Admitting: *Deleted

## 2018-04-06 DIAGNOSIS — E785 Hyperlipidemia, unspecified: Secondary | ICD-10-CM

## 2018-04-06 DIAGNOSIS — Z79899 Other long term (current) drug therapy: Secondary | ICD-10-CM

## 2018-04-06 LAB — LIPID PANEL
CHOLESTEROL TOTAL: 169 mg/dL (ref 100–199)
Chol/HDL Ratio: 3.8 ratio (ref 0.0–5.0)
HDL: 45 mg/dL (ref >39)
LDL Calculated: 106 mg/dL — ABNORMAL HIGH (ref 0–99)
Triglycerides: 88 mg/dL (ref 0–149)
VLDL CHOLESTEROL CAL: 18 mg/dL (ref 5–40)

## 2018-04-06 LAB — ALT: ALT: 26 IU/L (ref 0–44)

## 2018-04-07 ENCOUNTER — Other Ambulatory Visit: Payer: Self-pay | Admitting: *Deleted

## 2018-04-07 DIAGNOSIS — E785 Hyperlipidemia, unspecified: Secondary | ICD-10-CM

## 2018-04-07 DIAGNOSIS — Z79899 Other long term (current) drug therapy: Secondary | ICD-10-CM

## 2018-04-07 MED ORDER — ATORVASTATIN CALCIUM 80 MG PO TABS: 80.0000 mg | ORAL_TABLET | Freq: Every day | ORAL | 3 refills | Status: DC

## 2018-04-15 DIAGNOSIS — M79642 Pain in left hand: Secondary | ICD-10-CM | POA: Diagnosis not present

## 2018-04-20 DIAGNOSIS — E119 Type 2 diabetes mellitus without complications: Secondary | ICD-10-CM | POA: Diagnosis not present

## 2018-04-23 ENCOUNTER — Other Ambulatory Visit: Payer: Self-pay

## 2018-04-23 MED ORDER — PANTOPRAZOLE SODIUM 40 MG PO TBEC: 40.0000 mg | DELAYED_RELEASE_TABLET | Freq: Every day | ORAL | 3 refills | Status: DC

## 2018-04-30 DIAGNOSIS — E119 Type 2 diabetes mellitus without complications: Secondary | ICD-10-CM | POA: Diagnosis not present

## 2018-05-05 DIAGNOSIS — M25551 Pain in right hip: Secondary | ICD-10-CM | POA: Diagnosis not present

## 2018-05-08 DIAGNOSIS — E119 Type 2 diabetes mellitus without complications: Secondary | ICD-10-CM | POA: Diagnosis not present

## 2018-05-08 DIAGNOSIS — N200 Calculus of kidney: Secondary | ICD-10-CM | POA: Diagnosis not present

## 2018-05-13 DIAGNOSIS — M79642 Pain in left hand: Secondary | ICD-10-CM | POA: Diagnosis not present

## 2018-05-18 DIAGNOSIS — E119 Type 2 diabetes mellitus without complications: Secondary | ICD-10-CM | POA: Diagnosis not present

## 2018-05-18 DIAGNOSIS — H524 Presbyopia: Secondary | ICD-10-CM | POA: Diagnosis not present

## 2018-05-18 DIAGNOSIS — Z7984 Long term (current) use of oral hypoglycemic drugs: Secondary | ICD-10-CM | POA: Diagnosis not present

## 2018-06-01 ENCOUNTER — Other Ambulatory Visit: Payer: Self-pay | Admitting: Internal Medicine

## 2018-06-09 ENCOUNTER — Other Ambulatory Visit: Payer: 59

## 2018-07-25 ENCOUNTER — Other Ambulatory Visit: Payer: Self-pay | Admitting: Internal Medicine

## 2018-08-11 ENCOUNTER — Ambulatory Visit: Payer: Self-pay

## 2018-08-11 ENCOUNTER — Ambulatory Visit (INDEPENDENT_AMBULATORY_CARE_PROVIDER_SITE_OTHER)
Admission: RE | Admit: 2018-08-11 | Discharge: 2018-08-11 | Disposition: A | Discharge status: Home / Self Care | Payer: 59 | Source: Ambulatory Visit | Attending: Internal Medicine | Admitting: Internal Medicine

## 2018-08-11 ENCOUNTER — Ambulatory Visit: Payer: 59 | Admitting: Internal Medicine

## 2018-08-11 ENCOUNTER — Encounter: Payer: Self-pay | Admitting: Internal Medicine

## 2018-08-11 VITALS — BP 118/80 | HR 74 | Temp 98.3°F | Ht 69.0 in | Wt 205.0 lb

## 2018-08-11 DIAGNOSIS — Z23 Encounter for immunization: Secondary | ICD-10-CM

## 2018-08-11 DIAGNOSIS — L819 Disorder of pigmentation, unspecified: Secondary | ICD-10-CM | POA: Diagnosis not present

## 2018-08-11 DIAGNOSIS — M79672 Pain in left foot: Secondary | ICD-10-CM | POA: Diagnosis not present

## 2018-08-11 DIAGNOSIS — M1611 Unilateral primary osteoarthritis, right hip: Secondary | ICD-10-CM | POA: Diagnosis not present

## 2018-08-11 DIAGNOSIS — M25551 Pain in right hip: Secondary | ICD-10-CM | POA: Diagnosis not present

## 2018-08-11 MED ORDER — MELOXICAM 15 MG PO TABS: 15.0000 mg | ORAL_TABLET | Freq: Every day | ORAL | 0 refills | Status: DC

## 2018-08-11 NOTE — Telephone Encounter (Signed)
Pt. Reports he woke up yesterday morning with his left foot hurting, some swelling on top of the foot. This morning he has more swelling and his toes are "discolored or darkened." No wounds. No loss of sensation. Is able to put a shoe on. Painful to touch. Appointment made for today. No availability with [name]. Requests to see an MD.  Reason for Disposition . Foot or toe pain  Answer Assessment - Initial Assessment Questions 1. SYMPTOM: "What's the main symptom you're concerned about?" (e.g., rash, sore, callus, drainage, numbness)    Swelling, pain and toes are changing color 2. LOCATION: "Where is the  Swelling located?" (e.g., foot/toe, top/bottom, left/right)     On top of foot 3. ONSET: "When did the  swelling  start?"     Yesterday 4. PAIN: "Is there any pain?" If so, ask: "How bad is it?" (Scale: 1-10; mild, moderate, severe)     3 5. CAUSE: "What do you think is causing the symptoms?"     Unsure 6. OTHER SYMPTOMS: "Do you have any other symptoms?" (e.g., fever, weakness)     Toes are darkened 7. PREGNANCY: "Is there any chance you are pregnant?" "When was your last menstrual period?"     N/A  Protocols used: DIABETES - FOOT PROBLEMS AND QUESTIONS-A-AH

## 2018-08-11 NOTE — Telephone Encounter (Signed)
Noted  

## 2018-08-11 NOTE — Patient Instructions (Signed)
We have sent in meloxicam to use 1 pill daily for pain. We are checking the x-ray of the foot and hip today.   We have ordered the study for the blood flow of the legs.

## 2018-08-11 NOTE — Progress Notes (Signed)
   Subjective:    Patient ID: Jacob Gordon, male    DOB: Feb 19, 1956, 62 y.o.   MRN: [id number]  HPI The patient is a 62 YO male coming in for several concerns including left foot pain and swelling (started yesterday, he has not tried anything for it, denies injury or overuse, no new shoes, does have concurrent diabetes but denies neuropathy, overall is worsening, denies skin breakdown and skin is not red, denies previous problem like this), color change left foot (on the toes there is some coloration which looks like bruising, no known injury or overuse, no change in shoes or activity, denies cold toes, denies pain, denies swelling of the toes), and right hip pain (going on for a long time, has brought it up before with other doctor and they tried something which did not work, he does not recall what, pain mostly when walking, sometimes in the groin and sometimes on the side of the hip, denies injury or overuse, does not give out on him, denies numbness or weakness).    Review of Systems  Constitutional: Negative.   HENT: Negative.   Eyes: Negative.   Respiratory: Negative for cough, chest tightness and shortness of breath.   Cardiovascular: Negative for chest pain, palpitations and leg swelling.  Gastrointestinal: Negative for abdominal distention, abdominal pain, constipation, diarrhea, nausea and vomiting.  Musculoskeletal: Positive for arthralgias, joint swelling and myalgias.  Skin: Positive for color change.  Neurological: Negative.   Psychiatric/Behavioral: Negative.       Objective:   Physical Exam  Constitutional: He is oriented to person, place, and time. He appears well-developed and well-nourished.  HENT:  Head: Normocephalic and atraumatic.  Eyes: EOM are normal.  Neck: Normal range of motion.  Cardiovascular: Normal rate and regular rhythm.  Pulmonary/Chest: Effort normal and breath sounds normal. No respiratory distress. He has no wheezes. He has no rales.    Abdominal: Soft. Bowel sounds are normal. He exhibits no distension. There is no tenderness. There is no rebound.  Musculoskeletal: He exhibits edema.  Mild soft tissue swelling top of the left foot without erythema or cellulitis appearing, 2-4 toes left foot with appearance of darkening color appears consistent with bruising or similar without swelling, all toes left foot warm to touch with palpable PT and DP bilateral feet.  Right hip tender to touch in the groin region and some on the lateral surface  Neurological: He is alert and oriented to person, place, and time. Coordination normal.  Skin: Skin is warm and dry.  Psychiatric: He has a normal mood and affect.   Vitals:   08/11/18 1105  BP: 118/80  Pulse: 74  Temp: 98.3 F (36.8 C)  TempSrc: Oral  SpO2: 96%  Weight: 205 lb (93 kg)  Height: 5\' 9"  (1.753 m)      Assessment & Plan:  Flu shot given at visit

## 2018-08-13 ENCOUNTER — Ambulatory Visit (HOSPITAL_COMMUNITY)
Admission: RE | Admit: 2018-08-13 | Discharge: 2018-08-13 | Disposition: A | Discharge status: Home / Self Care | Payer: 59 | Source: Ambulatory Visit | Attending: Internal Medicine | Admitting: Internal Medicine

## 2018-08-13 DIAGNOSIS — L819 Disorder of pigmentation, unspecified: Secondary | ICD-10-CM | POA: Insufficient documentation

## 2018-08-13 DIAGNOSIS — M25551 Pain in right hip: Secondary | ICD-10-CM | POA: Insufficient documentation

## 2018-08-13 DIAGNOSIS — M79672 Pain in left foot: Secondary | ICD-10-CM | POA: Diagnosis not present

## 2018-08-13 NOTE — Assessment & Plan Note (Signed)
Appears to be bruising although the patient cannot recall injury. Toes are warm and not swollen. Palpable pulses DP and PT left foot. Checking ABI to rule out PVD. Follow up with PCP for monitoring.

## 2018-08-13 NOTE — Assessment & Plan Note (Signed)
Checking x-ray to rule out stress fracture. Does not appear to be gout. Rx for meloxicam. Does not appear to be cellulitis or skin infection.

## 2018-08-13 NOTE — Assessment & Plan Note (Signed)
Checking x-ray right hip. Rx for meloxicam.

## 2018-08-24 ENCOUNTER — Telehealth: Payer: Self-pay | Admitting: Internal Medicine

## 2018-08-24 NOTE — Telephone Encounter (Signed)
I would recommend he come back in with PCP to see any change from before.

## 2018-08-24 NOTE — Telephone Encounter (Signed)
Pt is not feeling better since his visit with Dr. Okey Dupre on 10/8. Requesting test/x-ray results and what is "next step".

## 2018-08-26 NOTE — Telephone Encounter (Signed)
Left message for patient to see if foot is improving since visit with Dr. Okey Dupre. If no improvement, advised pt to make an appt with his PCP/Plot[facility].

## 2018-09-17 ENCOUNTER — Other Ambulatory Visit: Payer: Self-pay | Admitting: Internal Medicine

## 2018-10-03 ENCOUNTER — Other Ambulatory Visit: Payer: Self-pay | Admitting: Internal Medicine

## 2018-11-16 NOTE — Progress Notes (Unsigned)
Wife checking status to see is PA has been started.

## 2018-11-17 NOTE — Telephone Encounter (Signed)
Key: [id number]

## 2018-11-19 ENCOUNTER — Telehealth: Payer: Self-pay

## 2018-11-19 NOTE — Telephone Encounter (Signed)
PA started on CoverMyMeds KEY:  [id number]

## 2018-11-22 ENCOUNTER — Other Ambulatory Visit: Payer: Self-pay | Admitting: Internal Medicine

## 2018-11-24 ENCOUNTER — Telehealth: Payer: Self-pay | Admitting: Internal Medicine

## 2018-11-24 MED ORDER — TADALAFIL 5 MG PO TABS: 5.0000 mg | ORAL_TABLET | Freq: Every day | ORAL | 0 refills | Status: DC

## 2018-11-24 NOTE — Telephone Encounter (Signed)
30 RX sent. Patient needs office visit before refills will be given

## 2018-11-24 NOTE — Telephone Encounter (Signed)
Copied from CRM [id number]. Topic: Quick Communication - Rx Refill/Question >> Nov 24, 2018 10:27 AM Gaynelle Adu wrote: Medication: tadalafil (CIALIS) 5 MG tablet - patient stated his insurance will cover the generic  brand   Has the patient contacted their pharmacy?no   Preferred Pharmacy (with phone number or street name): [id number] - [place], [place] - [address] [phone] (Phone) [phone] (Fax)  Agent: Please be advised that RX refills may take up to 3 business days. We ask that you follow-up with your pharmacy.

## 2018-12-05 IMAGING — DX DG HIP (WITH OR WITHOUT PELVIS) 2-3V*R*
2 series · 2 of 2 positions shown · non-contrast
Comparison: None.

CLINICAL DATA: Right-sided hip pain for several months, no known
injury, initial encounter

EXAM:
DG HIP (WITH OR WITHOUT PELVIS) 2V RIGHT

[hip ap]
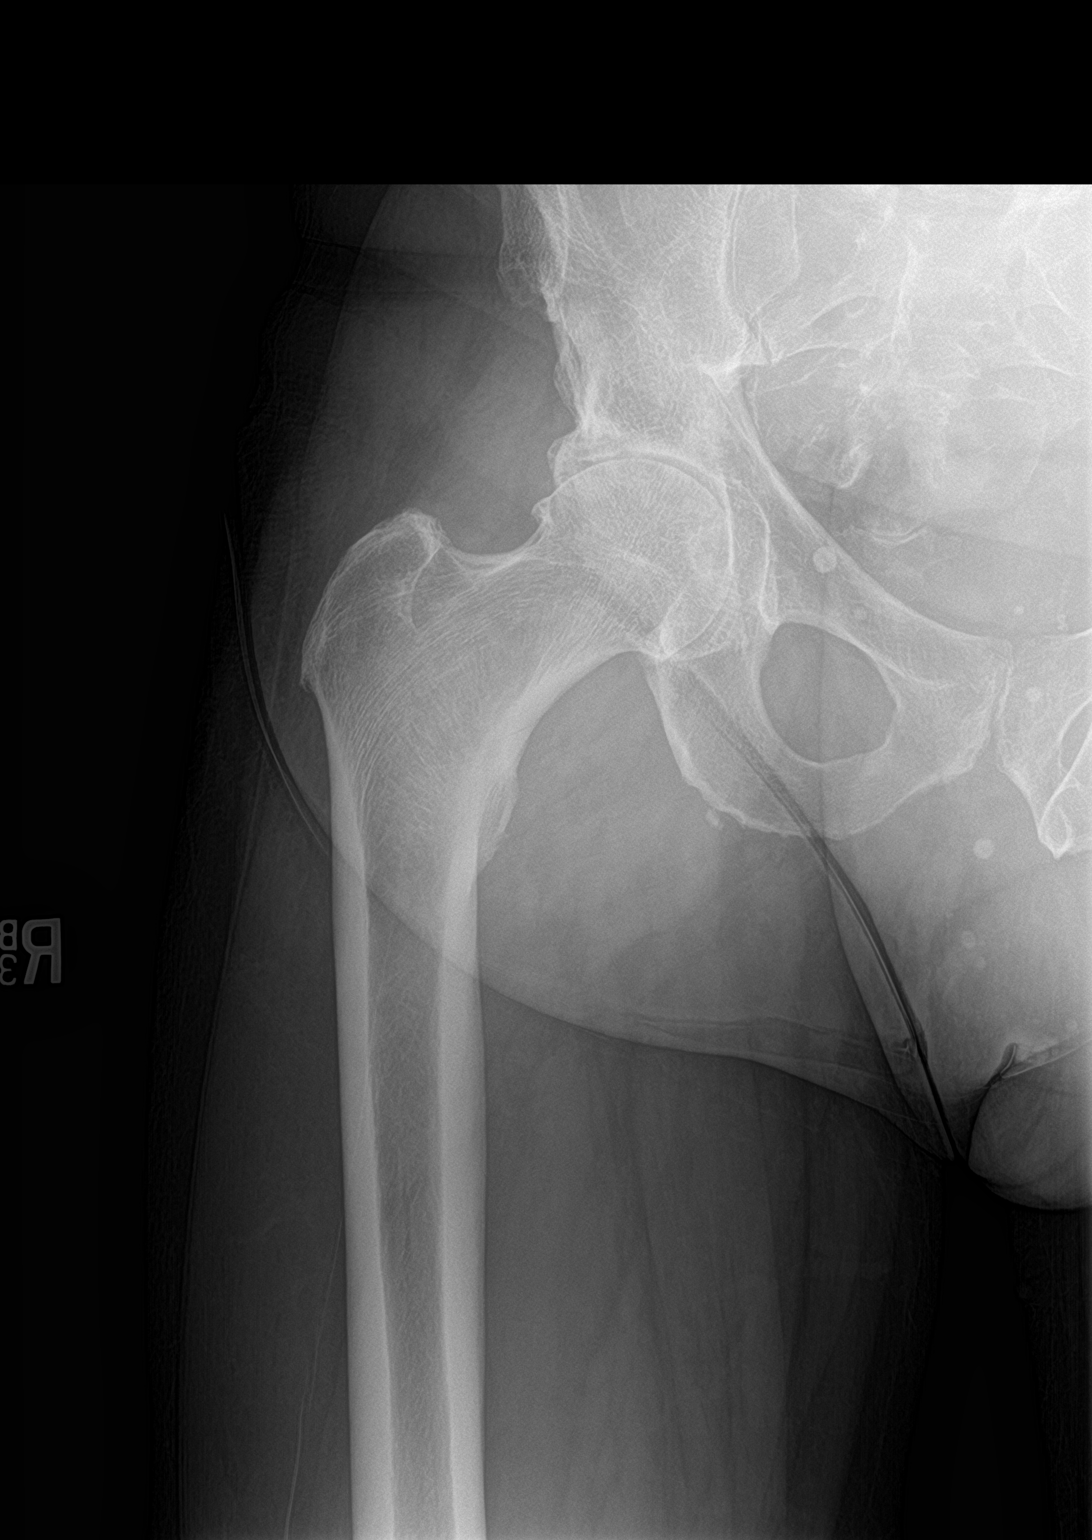

[hip lat]
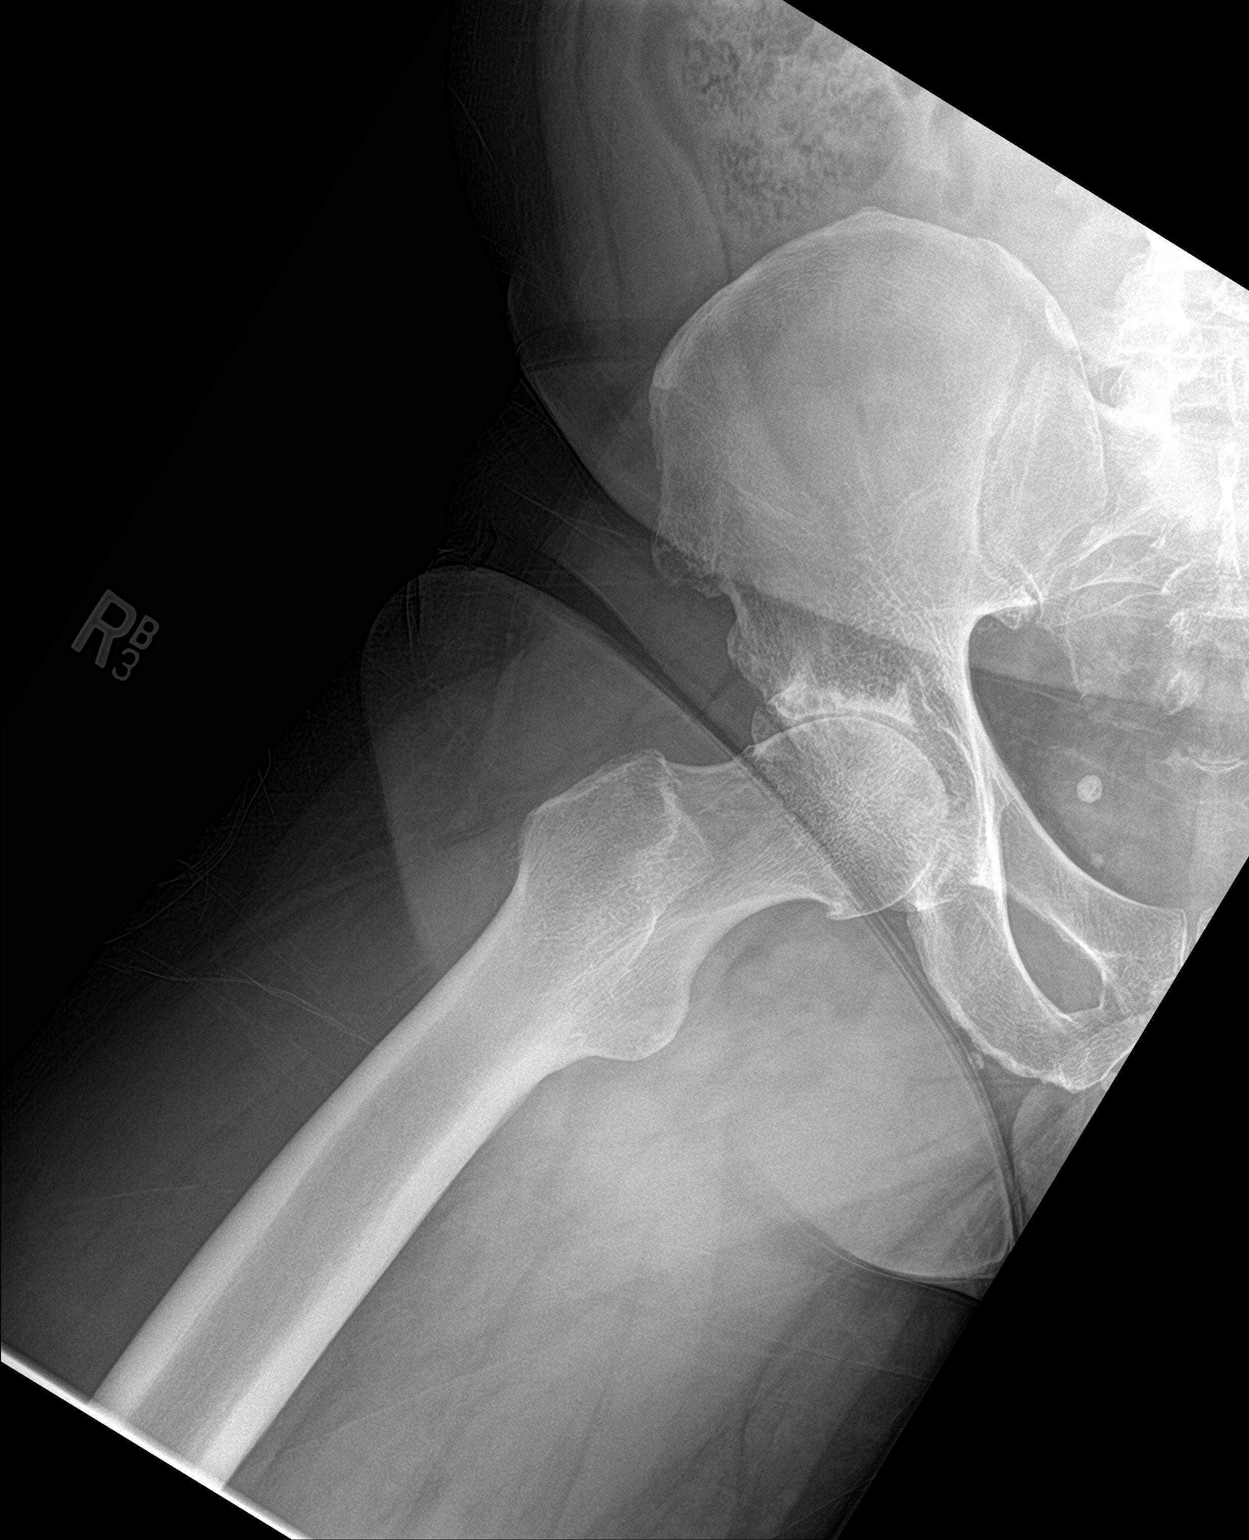

[2 of 2 positions shown; findings below may reference images not displayed]

FINDINGS: Degenerative changes of the right hip joint are noted with
bone-on-bone articulation superiorly. Mild remodeling of the femoral
head is seen. No acute fracture or dislocation is noted. No soft
tissue abnormality is seen.
IMPRESSION: Degenerative change without acute abnormality.

## 2018-12-16 ENCOUNTER — Encounter: Payer: 59 | Admitting: Internal Medicine

## 2018-12-28 ENCOUNTER — Other Ambulatory Visit: Payer: Self-pay | Admitting: Internal Medicine

## 2019-01-15 ENCOUNTER — Ambulatory Visit (INDEPENDENT_AMBULATORY_CARE_PROVIDER_SITE_OTHER): Payer: 59 | Admitting: Internal Medicine

## 2019-01-15 ENCOUNTER — Other Ambulatory Visit: Payer: Self-pay

## 2019-01-15 ENCOUNTER — Encounter: Payer: Self-pay | Admitting: Internal Medicine

## 2019-01-15 VITALS — BP 114/72 | HR 64 | Temp 98.2°F | Ht 69.0 in | Wt 205.0 lb

## 2019-01-15 DIAGNOSIS — M25551 Pain in right hip: Secondary | ICD-10-CM

## 2019-01-15 DIAGNOSIS — Z23 Encounter for immunization: Secondary | ICD-10-CM

## 2019-01-15 DIAGNOSIS — I2510 Atherosclerotic heart disease of native coronary artery without angina pectoris: Secondary | ICD-10-CM | POA: Diagnosis not present

## 2019-01-15 DIAGNOSIS — Z0000 Encounter for general adult medical examination without abnormal findings: Secondary | ICD-10-CM | POA: Diagnosis not present

## 2019-01-15 DIAGNOSIS — K648 Other hemorrhoids: Secondary | ICD-10-CM

## 2019-01-15 DIAGNOSIS — E1121 Type 2 diabetes mellitus with diabetic nephropathy: Secondary | ICD-10-CM | POA: Diagnosis not present

## 2019-01-15 DIAGNOSIS — K649 Unspecified hemorrhoids: Secondary | ICD-10-CM | POA: Insufficient documentation

## 2019-01-15 MED ORDER — HYDROCORTISONE ACETATE 25 MG RE SUPP: 25.0000 mg | Freq: Every evening | RECTAL | 2 refills | Status: DC | PRN

## 2019-01-15 NOTE — Addendum Note (Signed)
Addended by: Scarlett Presto on: 01/15/2019 02:21 PM   Modules accepted: Orders

## 2019-01-15 NOTE — Assessment & Plan Note (Signed)
Dr Smith ref 

## 2019-01-15 NOTE — Assessment & Plan Note (Signed)
S/p Cath 3/19 Coreg, Lipitor, Norvasc, ASA

## 2019-01-15 NOTE — Patient Instructions (Addendum)
Hip opener exercises Pillow between knees Weighted blanket   Piriformis Syndrome  Piriformis syndrome is a condition that can cause pain and numbness in your buttocks and down the back of your leg. Piriformis syndrome happens when the small muscle that connects the base of your spine to your hip (piriformis muscle) presses on the nerve that runs down the back of your leg (sciatic nerve). The piriformis muscle helps your hip rotate and helps to bring your leg back and out. It also helps shift your weight while you are walking to keep you stable. The sciatic nerve runs under or through the piriformis. Damage to the piriformis muscle can cause spasms that put pressure on the nerve below. This causes pain and discomfort while sitting and moving. The pain may feel as if it begins in the buttock and spreads (radiates) down your hip and thigh. What are the causes? This condition is caused by pressure on the sciatic nerve from the piriformis muscle. The piriformis muscle can get irritated with overuse, especially if other hip muscles are weak and the piriformis has to do extra work. Piriformis syndrome can also occur after an injury, like a fall onto your buttocks. What increases the risk? This condition is more likely to develop in:  Women.  People who sit for long periods of time.  Cyclists.  People who have weak buttocks muscles (gluteal muscles). What are the signs or symptoms? Pain, tingling, or numbness that starts in the buttock and runs down the back of your leg (sciatica) is the most common symptom of this condition. Your symptoms may:  Get worse the longer you sit.  Get worse when you walk, run, or go up on stairs. How is this diagnosed? This condition is diagnosed based on your symptoms, medical history, and physical exam. During this exam, your health care provider may move your leg into different positions to check for pain. He or she will also press on the muscles of your hip and  buttock to see if that increases your symptoms. You may also have an X-ray or MRI. How is this treated? Treatment for this condition may include:  Stopping all activities that cause pain or make your condition worse.  Using heat or ice to relieve pain as told by your health care provider.  Taking medicines to reduce pain and swelling.  Taking a muscle relaxer to release the piriformis muscle.  Doing range-of-motion and strengthening exercises (physical therapy) as told by your health care provider.  Massaging the affected area.  Getting an injection of an anti-inflammatory medicine or muscle relaxer to reduce inflammation and muscle tension. In rare cases, you may need surgery to cut the muscle and release pressure on the nerve if other treatments do not work. Follow these instructions at home:  Take over-the-counter and prescription medicines only as told by your health care provider.  Do not sit for long periods. Get up and walk around every 20 minutes or as often as told by your health care provider.  If directed, apply heat to the affected area as often as told by your health care provider. Use the heat source that your health care provider recommends, such as a moist heat pack or a heating pad. ? Place a towel between your skin and the heat source. ? Leave the heat on for 20-30 minutes. ? Remove the heat if your skin turns bright red. This is especially important if you are unable to feel pain, heat, or cold. You may have  a greater risk of getting burned.  If directed, apply ice to the injured area. ? Put ice in a plastic bag. ? Place a towel between your skin and the bag. ? Leave the ice on for 20 minutes, 2-3 times a day.  Do exercises as told by your health care provider.  Return to your normal activities as told by your health care provider. Ask your health care provider what activities are safe for you.  Keep all follow-up visits as told by your health care provider.  This is important. How is this prevented?  Do not sit for longer than 20 minutes at a time. When you sit, choose padded surfaces.  Warm up and stretch before being active.  Cool down and stretch after being active.  Give your body time to rest between periods of activity.  Make sure to use equipment that fits you.  Maintain physical fitness, including: ? Strength. ? Flexibility. Contact a health care provider if:  Your pain and stiffness continue or get worse.  Your leg or hip becomes weak.  You have changes in your bowel function or bladder function. This information is not intended to replace advice given to you by your health care provider. Make sure you discuss any questions you have with your health care provider. Document Released: 10/21/2005 Document Revised: 06/25/2016 Document Reviewed: 10/03/2015 Elsevier Interactive Patient Education  2019 [employer].

## 2019-01-15 NOTE — Progress Notes (Signed)
Subjective:  Patient ID: Jacob Gordon, male    DOB: 1956-01-30  Age: 63 y.o. MRN: [id number]  CC: No chief complaint on file.   HPI Brody Hillery presents for a well exam C/o frequent urination C/o hemorrhoids periodically C/o R hip/buttock pain  Outpatient Medications Prior to Visit  Medication Sig Dispense Refill  . amLODipine (NORVASC) 10 MG tablet TAKE 1 TABLET BY MOUTH EVERY DAY. NEEDS OFFICE VISIT FOR FURTHER REFILLS. (Patient taking differently: TAKE 10 MG BY MOUTH EVERY DAY) 90 tablet 2  . ASPIRIN 81 PO Take by mouth.    Marland Kitchen atorvastatin (LIPITOR) 80 MG tablet Take 1 tablet (80 mg total) by mouth daily. 90 tablet 3  . carvedilol (COREG) 25 MG tablet TAKE 1 TABLET BY MOUTH TWICE DAILY WITH A MEAL. NEEDS OFFICE VISIT FOR FURTHER REFILLS. (Patient taking differently: TAKE 25 MG BY MOUTH TWICE DAILY WITH A MEAL) 180 tablet 0  . Cholecalciferol (VITAMIN D3) 2000 units capsule Take 1 capsule (2,000 Units total) by mouth daily. 100 capsule 3  . meloxicam (MOBIC) 15 MG tablet TAKE 1 TABLET(15 MG) BY MOUTH DAILY 30 tablet 2  . naproxen sodium (ALEVE) 220 MG tablet Take 440 mg by mouth daily as needed (for pain or headache).     . OVER THE COUNTER MEDICATION Take 1 capsule by mouth daily. Suprema Dophilus Supplement    . pantoprazole (PROTONIX) 40 MG tablet TAKE 1 TABLET(40 MG) BY MOUTH DAILY 90 tablet 3  . tadalafil (CIALIS) 5 MG tablet TAKE 1 TABLET(5 MG) BY MOUTH DAILY 30 tablet 0  . terazosin (HYTRIN) 5 MG capsule Take 5 mg by mouth daily.     . TRADJENTA 5 MG TABS tablet TAKE 1 TABLET BY MOUTH  DAILY 90 tablet 0  . WELCHOL 625 MG tablet TAKE 3 TABLETS BY MOUTH  TWICE A DAY WITH A MEAL 540 tablet 1   No facility-administered medications prior to visit.     ROS: Review of Systems  Constitutional: Negative for appetite change, fatigue and unexpected weight change.  HENT: Negative for congestion, nosebleeds, sneezing, sore throat and trouble swallowing.   Eyes:  Negative for itching and visual disturbance.  Respiratory: Negative for cough.   Cardiovascular: Negative for chest pain, palpitations and leg swelling.  Gastrointestinal: Positive for constipation. Negative for abdominal distention, blood in stool, diarrhea and nausea.  Genitourinary: Negative for frequency and hematuria.  Musculoskeletal: Negative for back pain, gait problem, joint swelling and neck pain.  Skin: Negative for rash.  Neurological: Negative for dizziness, tremors, speech difficulty and weakness.  Psychiatric/Behavioral: Negative for agitation, dysphoric mood and sleep disturbance. The patient is not nervous/anxious.     Objective:  BP 114/72 (BP Location: Left Arm, Patient Position: Sitting, Cuff Size: Large)   Pulse 64   Temp 98.2 F (36.8 C) (Oral)   Ht 5\' 9"  (1.753 m)   Wt 205 lb (93 kg)   SpO2 97%   BMI 30.27 kg/m   BP Readings from Last 3 Encounters:  01/15/19 114/72  08/11/18 118/80  02/02/18 120/76    Wt Readings from Last 3 Encounters:  01/15/19 205 lb (93 kg)  08/11/18 205 lb (93 kg)  02/02/18 206 lb 1.9 oz (93.5 kg)    Physical Exam Constitutional:      General: He is not in acute distress.    Appearance: He is well-developed.     Comments: NAD  Eyes:     Conjunctiva/sclera: Conjunctivae normal.     Pupils: Pupils  are equal, round, and reactive to light.  Neck:     Musculoskeletal: Normal range of motion.     Thyroid: No thyromegaly.     Vascular: No JVD.  Cardiovascular:     Rate and Rhythm: Normal rate and regular rhythm.     Heart sounds: Normal heart sounds. No murmur. No friction rub. No gallop.   Pulmonary:     Effort: Pulmonary effort is normal. No respiratory distress.     Breath sounds: Normal breath sounds. No wheezing or rales.  Chest:     Chest wall: No tenderness.  Abdominal:     General: Bowel sounds are normal. There is no distension.     Palpations: Abdomen is soft. There is no mass.     Tenderness: There is no  abdominal tenderness. There is no guarding or rebound.  Musculoskeletal: Normal range of motion.        General: No tenderness.  Lymphadenopathy:     Cervical: No cervical adenopathy.  Skin:    General: Skin is warm and dry.     Findings: No rash.  Neurological:     Mental Status: He is alert and oriented to person, place, and time.     Cranial Nerves: No cranial nerve deficit.     Motor: No abnormal muscle tone.     Coordination: Coordination normal.     Gait: Gait normal.     Deep Tendon Reflexes: Reflexes are normal and symmetric.  Psychiatric:        Behavior: Behavior normal.        Thought Content: Thought content normal.        Judgment: Judgment normal.   int hemorrhoids  R hip/buttock w/pain Lab Results  Component Value Date   WBC 6.3 01/02/2018   HGB 16.0 01/02/2018   HCT 45.4 01/02/2018   PLT 151 01/02/2018   GLUCOSE 127 (H) 01/02/2018   CHOL 169 04/06/2018   TRIG 88 04/06/2018   HDL 45 04/06/2018   LDLDIRECT 184.1 10/20/2012   LDLCALC 106 (H) 04/06/2018   ALT 26 04/06/2018   AST 22 11/26/2017   NA 142 01/02/2018   K 4.2 01/02/2018   CL 104 01/02/2018   CREATININE 1.34 (H) 01/02/2018   BUN 14 01/02/2018   CO2 24 01/02/2018   TSH 1.17 11/26/2017   PSA 1.00 10/20/2014   INR 1.1 01/02/2018   HGBA1C 6.5 11/26/2017   MICROALBUR 0.7 11/21/2008    Vas [place] Le Art Seg Multi (segm&le Reynauds)  Result Date: 08/14/2018 LOWER EXTREMITY DOPPLER STUDY Indications: Patient presents with left foot pain and swelling with              discoloration of the toes on the left foot. High Risk Factors: Hyperlipidemia, Diabetes, no history of smoking, coronary                    artery disease.  Performing Technologist: Levy Sjogren RVT  Examination Guidelines: A complete evaluation includes at minimum, Doppler waveform signals and systolic blood pressure reading at the level of bilateral brachial, anterior tibial, and posterior tibial arteries, when vessel segments are  accessible. Bilateral testing is considered an integral part of a complete examination. Photoelectric Plethysmograph (PPG) waveforms and toe systolic pressure readings are included as required and additional duplex testing as needed. Limited examinations for reoccurring indications may be performed as noted.  ABI Findings: +---------+------------------+-----+---------+--------+ Right    Rt Pressure (mmHg)IndexWaveform Comment  +---------+------------------+-----+---------+--------+ Brachial 134                                      +---------+------------------+-----+---------+--------+  CFA                             triphasic         +---------+------------------+-----+---------+--------+ Popliteal                       triphasic         +---------+------------------+-----+---------+--------+ ATA      149               1.11 triphasic         +---------+------------------+-----+---------+--------+ PTA      166               1.24 triphasic         +---------+------------------+-----+---------+--------+ PERO     150               1.12 triphasic         +---------+------------------+-----+---------+--------+ Great Toe115               0.86 Normal            +---------+------------------+-----+---------+--------+ +---------+------------------+-----+---------+-------+ Left     Lt Pressure (mmHg)IndexWaveform Comment +---------+------------------+-----+---------+-------+ Brachial 129                                     +---------+------------------+-----+---------+-------+ CFA                             triphasic        +---------+------------------+-----+---------+-------+ Popliteal                       triphasic        +---------+------------------+-----+---------+-------+ ATA      162               1.21 triphasic        +---------+------------------+-----+---------+-------+ PTA      154               1.15 triphasic         +---------+------------------+-----+---------+-------+ PERO     157               1.17 triphasic        +---------+------------------+-----+---------+-------+ Great Toe156               1.16 Normal           +---------+------------------+-----+---------+-------+  Summary: Right: Resting right ankle-brachial index is within normal range. No evidence of significant right lower extremity arterial disease. The right toe-brachial index is normal. Left: Resting left ankle-brachial index is within normal range. No evidence of significant left lower extremity arterial disease. The left toe-brachial index is normal.  *See table(s) above for measurements and observations.  Electronically signed by Lorine Bears MD on 08/14/2018 at 1:11:11 PM.    Final     Assessment & Plan:   There are no diagnoses linked to this encounter.   No orders of the defined types were placed in this encounter.    Follow-up: No follow-ups on file.  Sonda Primes, MD

## 2019-01-15 NOTE — Assessment & Plan Note (Signed)
Tradjenta

## 2019-01-15 NOTE — Assessment & Plan Note (Signed)
We discussed age appropriate health related issues, including available/recomended screening tests and vaccinations. We discussed a need for adhering to healthy diet and exercise. Labs were ordered to be later reviewed . All questions were answered. Colon 2012 Dr Leone Payor Colon 2018 at the [facility]

## 2019-01-15 NOTE — Assessment & Plan Note (Signed)
Anusol- HC

## 2019-01-18 ENCOUNTER — Other Ambulatory Visit (INDEPENDENT_AMBULATORY_CARE_PROVIDER_SITE_OTHER): Payer: 59

## 2019-01-18 DIAGNOSIS — Z0000 Encounter for general adult medical examination without abnormal findings: Secondary | ICD-10-CM

## 2019-01-18 DIAGNOSIS — E1121 Type 2 diabetes mellitus with diabetic nephropathy: Secondary | ICD-10-CM | POA: Diagnosis not present

## 2019-01-18 LAB — URINALYSIS
Bilirubin Urine: NEGATIVE
Hgb urine dipstick: NEGATIVE
Ketones, ur: NEGATIVE
Leukocytes,Ua: NEGATIVE
NITRITE: NEGATIVE
Specific Gravity, Urine: 1.025 (ref 1.000–1.030)
Total Protein, Urine: NEGATIVE
Urine Glucose: NEGATIVE
Urobilinogen, UA: 0.2 (ref 0.0–1.0)
pH: 6 (ref 5.0–8.0)

## 2019-01-18 LAB — CBC WITH DIFFERENTIAL/PLATELET
Basophils Absolute: 0 10*3/uL (ref 0.0–0.1)
Basophils Relative: 0.3 % (ref 0.0–3.0)
EOS ABS: 0.1 10*3/uL (ref 0.0–0.7)
Eosinophils Relative: 2 % (ref 0.0–5.0)
HCT: 48.7 % (ref 39.0–52.0)
Hemoglobin: 16.2 g/dL (ref 13.0–17.0)
Lymphocytes Relative: 24.9 % (ref 12.0–46.0)
Lymphs Abs: 1.8 10*3/uL (ref 0.7–4.0)
MCHC: 33.3 g/dL (ref 30.0–36.0)
MCV: 94.7 fl (ref 78.0–100.0)
Monocytes Absolute: 0.7 10*3/uL (ref 0.1–1.0)
Monocytes Relative: 10.1 % (ref 3.0–12.0)
Neutro Abs: 4.6 10*3/uL (ref 1.4–7.7)
Neutrophils Relative %: 62.7 % (ref 43.0–77.0)
Platelets: 125 10*3/uL — ABNORMAL LOW (ref 150.0–400.0)
RBC: 5.15 Mil/uL (ref 4.22–5.81)
RDW: 14.6 % (ref 11.5–15.5)
WBC: 7.3 10*3/uL (ref 4.0–10.5)

## 2019-01-18 LAB — LIPID PANEL
Cholesterol: 111 mg/dL (ref 0–200)
HDL: 45.8 mg/dL (ref >39.00)
LDL CALC: 54 mg/dL (ref 0–99)
NonHDL: 65.42
Total CHOL/HDL Ratio: 2
Triglycerides: 59 mg/dL (ref 0.0–149.0)
VLDL: 11.8 mg/dL (ref 0.0–40.0)

## 2019-01-18 LAB — PSA: PSA: 0.9 ng/mL (ref 0.10–4.00)

## 2019-01-18 LAB — HEMOGLOBIN A1C: Hgb A1c MFr Bld: 7.1 % — ABNORMAL HIGH (ref 4.6–6.5)

## 2019-01-18 LAB — BASIC METABOLIC PANEL
BUN: 20 mg/dL (ref 6–23)
CHLORIDE: 104 meq/L (ref 96–112)
CO2: 28 mEq/L (ref 19–32)
Calcium: 9.2 mg/dL (ref 8.4–10.5)
Creatinine, Ser: 1.23 mg/dL (ref 0.40–1.50)
GFR: 72.1 mL/min (ref >60.00)
Glucose, Bld: 117 mg/dL — ABNORMAL HIGH (ref 70–99)
Potassium: 4 mEq/L (ref 3.5–5.1)
Sodium: 141 mEq/L (ref 135–145)

## 2019-01-18 LAB — HEPATIC FUNCTION PANEL
ALBUMIN: 4 g/dL (ref 3.5–5.2)
ALK PHOS: 92 U/L (ref 39–117)
ALT: 24 U/L (ref 0–53)
AST: 19 U/L (ref 0–37)
BILIRUBIN TOTAL: 1.1 mg/dL (ref 0.2–1.2)
Bilirubin, Direct: 0.2 mg/dL (ref 0.0–0.3)
Total Protein: 6.5 g/dL (ref 6.0–8.3)

## 2019-01-18 LAB — TSH: TSH: 2.11 u[IU]/mL (ref 0.35–4.50)

## 2019-01-26 MED ORDER — LINAGLIPTIN 5 MG PO TABS: 5.0000 mg | ORAL_TABLET | Freq: Every day | ORAL | 0 refills | Status: DC

## 2019-01-26 MED ORDER — TADALAFIL 5 MG PO TABS: ORAL_TABLET | ORAL | 1 refills | Status: DC

## 2019-01-26 MED ORDER — COLESEVELAM HCL 625 MG PO TABS: ORAL_TABLET | ORAL | 1 refills | Status: DC

## 2019-02-04 ENCOUNTER — Other Ambulatory Visit: Payer: Self-pay

## 2019-02-04 ENCOUNTER — Ambulatory Visit: Payer: Self-pay

## 2019-02-04 ENCOUNTER — Ambulatory Visit (INDEPENDENT_AMBULATORY_CARE_PROVIDER_SITE_OTHER): Payer: 59 | Admitting: Family Medicine

## 2019-02-04 ENCOUNTER — Encounter: Payer: Self-pay | Admitting: Family Medicine

## 2019-02-04 VITALS — BP 110/70 | HR 79 | Ht 69.0 in | Wt 205.0 lb

## 2019-02-04 DIAGNOSIS — M25551 Pain in right hip: Secondary | ICD-10-CM

## 2019-02-04 DIAGNOSIS — M7601 Gluteal tendinitis, right hip: Secondary | ICD-10-CM | POA: Diagnosis not present

## 2019-02-04 DIAGNOSIS — M1611 Unilateral primary osteoarthritis, right hip: Secondary | ICD-10-CM

## 2019-02-04 NOTE — Progress Notes (Signed)
[facility] [address] [place], [place] [address] Phone: [phone] Subjective:    I'm seeing this patient by the request  of:  Plotnikov, Georgina Quint, MD   CC: Right hip pain  HFS:[id number]  Jacob Gordon is a 63 y.o. male coming in with complaint of right hip pain. Pain for past 8 months. Pain is intermittent. Stair climbing increases his pain. Patient notes that he has the feeling like he is dragging his leg along with him. No injury to the area. Pain over piriformis and has had pain over the greater trochanter. Has on occasion felt pain in the adductor. Uses Aleve daily for pain. Has also tried heat, TENS unit, and biofreeze. Denies any radiating symptoms.  Patient did have x-rays in October 2019.  Improvement visualized by me showing severe nearly bone-on-bone osteoarthritic changes.      Past Medical History:  Diagnosis Date   ALLERGIC RHINITIS    Chronic headaches    DEGENERATIVE DISC DISEASE    DIABETES MELLITUS, TYPE II    GERD    History of kidney stones    HLD (hyperlipidemia)    HYPERTENSION    Internal hemorrhoids    OSTEOARTHRITIS    THROMBOCYTOPENIA    Past Surgical History:  Procedure Laterality Date   COLONOSCOPY  10/01/11   internal hemorrhoids   LEFT HEART CATH AND CORONARY ANGIOGRAPHY N/A 01/07/2018   Procedure: LEFT HEART CATH AND CORONARY ANGIOGRAPHY;  Surgeon: Kathleene Hazel, MD;  Location: [facility];  Service: Cardiovascular;  Laterality: N/A;   SHOULDER SURGERY     right rotator cuff surg   TONSILLECTOMY AND ADENOIDECTOMY     VASECTOMY     Social History   Socioeconomic History   Marital status: Married    Spouse name: Not on file   Number of children: 1   Years of education: Not on file   Highest education level: Not on file  Occupational History   Occupation: Brewing technologist strain: Not on file   Food insecurity:    Worry:  Not on file    Inability: Not on file   Transportation needs:    Medical: Not on file    Non-medical: Not on file  Tobacco Use   Smoking status: Never Smoker   Smokeless tobacco: Never Used  Substance and Sexual Activity   Alcohol use: No   Drug use: No   Sexual activity: Not on file  Lifestyle   Physical activity:    Days per week: Not on file    Minutes per session: Not on file   Stress: Not on file  Relationships   Social connections:    Talks on phone: Not on file    Gets together: Not on file    Attends religious service: Not on file    Active member of club or organization: Not on file    Attends meetings of clubs or organizations: Not on file    Relationship status: Not on file  Other Topics Concern   Not on file  Social History Narrative   Caffeine daily    Allergies  Allergen Reactions   Tape Other (See Comments)    ADHESIVE    Sulfa Antibiotics Other (See Comments)    Unknown   Valsartan Other (See Comments)    Unknown   Amlodipine-Atorvastatin Nausea Only   Pravastatin Sodium Nausea Only   Family History  Problem Relation Age of  Onset   Diabetes Sister    Colon cancer Neg Hx     Current Outpatient Medications (Endocrine & Metabolic):    linagliptin (TRADJENTA) 5 MG TABS tablet, Take 1 tablet (5 mg total) by mouth daily.  Current Outpatient Medications (Cardiovascular):    amLODipine (NORVASC) 10 MG tablet, TAKE 1 TABLET BY MOUTH EVERY DAY. NEEDS OFFICE VISIT FOR FURTHER REFILLS. (Patient taking differently: TAKE 10 MG BY MOUTH EVERY DAY)   atorvastatin (LIPITOR) 80 MG tablet, Take 1 tablet (80 mg total) by mouth daily.   carvedilol (COREG) 25 MG tablet, TAKE 1 TABLET BY MOUTH TWICE DAILY WITH A MEAL. NEEDS OFFICE VISIT FOR FURTHER REFILLS. (Patient taking differently: TAKE 25 MG BY MOUTH TWICE DAILY WITH A MEAL)   colesevelam (WELCHOL) 625 MG tablet, TAKE 3 TABLETS BY MOUTH  TWICE A DAY WITH A MEAL   tadalafil (CIALIS) 5 MG  tablet, TAKE 1 TABLET(5 MG) BY MOUTH DAILY   terazosin (HYTRIN) 5 MG capsule, Take 5 mg by mouth daily.    Current Outpatient Medications (Analgesics):    ASPIRIN 81 PO, Take by mouth.   meloxicam (MOBIC) 15 MG tablet, TAKE 1 TABLET(15 MG) BY MOUTH DAILY   naproxen sodium (ALEVE) 220 MG tablet, Take 440 mg by mouth daily as needed (for pain or headache).    Current Outpatient Medications (Other):    Cholecalciferol (VITAMIN D3) 2000 units capsule, Take 1 capsule (2,000 Units total) by mouth daily.   hydrocortisone (ANUSOL-HC) 25 MG suppository, Place 1 suppository (25 mg total) rectally at bedtime as needed for hemorrhoids or anal itching.   OVER THE COUNTER MEDICATION, Take 1 capsule by mouth daily. Suprema Dophilus Supplement   pantoprazole (PROTONIX) 40 MG tablet, TAKE 1 TABLET(40 MG) BY MOUTH DAILY    Past medical history, social, surgical and family history all reviewed in electronic medical record.  No pertanent information unless stated regarding to the chief complaint.   Review of Systems:  No headache, visual changes, nausea, vomiting, diarrhea, constipation, dizziness, abdominal pain, skin rash, fevers, chills, night sweats, weight loss, swollen lymph nodes, body aches, joint swelling, muscle aches, chest pain, shortness of breath, mood changes.   Objective  Blood pressure 110/70, pulse 79, height 5\' 9"  (1.753 m), weight 205 lb (93 kg), SpO2 97 %. Systems examined below as of    General: No apparent distress alert and oriented x3 mood and affect normal, dressed appropriately.  HEENT: Pupils equal, extraocular movements intact  Respiratory: Patient's speak in full sentences and does not appear short of breath  Cardiovascular: No lower extremity edema, non tender, no erythema  Skin: Warm dry intact with no signs of infection or rash on extremities or on axial skeleton.  Abdomen: Soft nontender  Neuro: Cranial nerves II through XII are intact, neurovascularly intact in  all extremities with 2+ DTRs and 2+ pulses.  Lymph: No lymphadenopathy of posterior or anterior cervical chain or axillae bilaterally.  Gait an mild arthritic changes of multiple joints  Right hip exam and severe decrease in range of motion with no internal range of motion.  Tightness with Pearlean Brownie.  Negative straight leg test.  Left hip also has some decreased range of motion.  Decreased extension.  Significant pain over the gluteal tendon    Procedure: Real-time Ultrasound Guided Injection of right gluteal tendon sheath Device: GE Logiq Q7 Ultrasound guided injection is preferred based studies that show increased duration, increased effect, greater accuracy, decreased procedural pain, increased response rate, and decreased cost with ultrasound  guided versus blind injection.  Verbal informed consent obtained.  Time-out conducted.  Noted no overlying erythema, induration, or other signs of local infection.  Skin prepped in a sterile fashion.  Local anesthesia: Topical Ethyl chloride.  With sterile technique and under real time ultrasound guidance: With a 21-gauge 2 inch needle injected 0.5 cc of 0.5% Marcaine and 1 cc of Kenalog 40 mg/mL into the right gluteal tendon sheath Completed without difficulty  Pain immediately resolved suggesting accurate placement of the medication.  Advised to call if fevers/chills, erythema, induration, drainage, or persistent bleeding.  Images permanently stored and available for review in the ultrasound unit.  Impression: Technically successful ultrasound guided injection.   Impression and Recommendations:     This case required medical decision making of moderate complexity. The above documentation has been reviewed and is accurate and complete Judi Saa, DO       Note: This dictation was prepared with Dragon dictation along with smaller phrase technology. Any transcriptional errors that result from this process are unintentional.

## 2019-02-04 NOTE — Assessment & Plan Note (Signed)
Right-sided hip arthritis.  Fairly severe on x-rays and decreased range of motion noted.  If no significant improvement will consider intra-articular injection at follow-up

## 2019-02-04 NOTE — Patient Instructions (Signed)
Good to see you  Ice 20 minutes 2 times a day injected the glute tendon.  You do have arthritis in the hip as well  Over the counter get  Turmeric 500mg  daily  Tart cherry extract any dose at night Vitamin D 2000 IU daily  If this does not do the trick may consider a hip joint injection we can do in 5-6 weeks

## 2019-02-04 NOTE — Assessment & Plan Note (Signed)
Patient given injection, tolerated procedure well.  Discussed icing regimen and home exercise.  Discussed which activities doing which wants to avoid.  Patient also has some underlying right hip arthritis that is likely contributing to most of the discomfort and pain as well.  May need hip replacement in the near future.  Discussed an intra-articular injection at next visit.

## 2019-02-19 ENCOUNTER — Ambulatory Visit: Payer: 59 | Admitting: Family Medicine

## 2019-02-25 ENCOUNTER — Encounter: Payer: Self-pay | Admitting: Family Medicine

## 2019-02-25 DIAGNOSIS — M1611 Unilateral primary osteoarthritis, right hip: Secondary | ICD-10-CM

## 2019-03-11 ENCOUNTER — Other Ambulatory Visit: Payer: Self-pay

## 2019-03-11 ENCOUNTER — Ambulatory Visit: Payer: 59 | Admitting: Family Medicine

## 2019-03-11 ENCOUNTER — Encounter: Payer: Self-pay | Admitting: Family Medicine

## 2019-03-11 ENCOUNTER — Ambulatory Visit: Payer: Self-pay

## 2019-03-11 VITALS — BP 116/90 | HR 71 | Ht 69.0 in | Wt 201.0 lb

## 2019-03-11 DIAGNOSIS — M25551 Pain in right hip: Secondary | ICD-10-CM

## 2019-03-11 DIAGNOSIS — G5701 Lesion of sciatic nerve, right lower limb: Secondary | ICD-10-CM | POA: Insufficient documentation

## 2019-03-11 DIAGNOSIS — M1611 Unilateral primary osteoarthritis, right hip: Secondary | ICD-10-CM | POA: Diagnosis not present

## 2019-03-11 MED ORDER — GABAPENTIN 100 MG PO CAPS: 200.0000 mg | ORAL_CAPSULE | Freq: Every day | ORAL | 3 refills | Status: DC

## 2019-03-11 NOTE — Progress Notes (Signed)
Jacob Gordon D.O. [facility] [address] [place], [place] [address] Phone: [phone] Subjective:   I Jacob Gordon am serving as a Neurosurgeonscribe for Dr. Antoine PrimasZachary Gordon.    CC: Hip pain follow-up  ZHY:[id number]:Subjective   02/04/2019 Right-sided hip arthritis.  Fairly severe on x-rays and decreased range of motion noted.  If no significant improvement will consider intra-articular injection at follow-up   Patient given injection, tolerated procedure well.  Discussed icing regimen and home exercise.  Discussed which activities doing which wants to avoid.  Patient also has some underlying right hip arthritis that is likely contributing to most of the discomfort and pain as well.  May need hip replacement in the near future.  Discussed an intra-articular injection at next visit.  03/11/2019 Jacob Gordon is a 63 y.o. male coming in with complaint of hip pain.  Patient with known him more of a gluteal tendinitis of the right buttocks area.  Given injection.  Patient has underlying osteoarthritic changes of the hip. States it felt good for about a week after his last visit. Patient is wondering what else he can do.  He is looking into orthopedic surgeon to discuss the hip replacement.      Past Medical History:  Diagnosis Date  . ALLERGIC RHINITIS   . Chronic headaches   . DEGENERATIVE DISC DISEASE   . DIABETES MELLITUS, TYPE II   . GERD   . History of kidney stones   . HLD (hyperlipidemia)   . HYPERTENSION   . Internal hemorrhoids   . OSTEOARTHRITIS   . THROMBOCYTOPENIA    Past Surgical History:  Procedure Laterality Date  . COLONOSCOPY  10/01/11   internal hemorrhoids  . LEFT HEART CATH AND CORONARY ANGIOGRAPHY N/A 01/07/2018   Procedure: LEFT HEART CATH AND CORONARY ANGIOGRAPHY;  Surgeon: Kathleene HazelMcAlhany, Christopher D, MD;  Location: [facility];  Service: Cardiovascular;  Laterality: N/A;  . SHOULDER SURGERY     right rotator cuff surg  . TONSILLECTOMY AND ADENOIDECTOMY     . VASECTOMY     Social History   Socioeconomic History  . Marital status: Married    Spouse name: Not on file  . Number of children: 1  . Years of education: Not on file  . Highest education level: Not on file  Occupational History  . Occupation: Designer, industrial/productAdministrative   Social Needs  . Financial resource strain: Not on file  . Food insecurity:    Worry: Not on file    Inability: Not on file  . Transportation needs:    Medical: Not on file    Non-medical: Not on file  Tobacco Use  . Smoking status: Never Smoker  . Smokeless tobacco: Never Used  Substance and Sexual Activity  . Alcohol use: No  . Drug use: No  . Sexual activity: Not on file  Lifestyle  . Physical activity:    Days per week: Not on file    Minutes per session: Not on file  . Stress: Not on file  Relationships  . Social connections:    Talks on phone: Not on file    Gets together: Not on file    Attends religious service: Not on file    Active member of club or organization: Not on file    Attends meetings of clubs or organizations: Not on file    Relationship status: Not on file  Other Topics Concern  . Not on file  Social History Narrative   Caffeine daily  Allergies  Allergen Reactions  . Tape Other (See Comments)    ADHESIVE   . Sulfa Antibiotics Other (See Comments)    Unknown  . Valsartan Other (See Comments)    Unknown  . Amlodipine-Atorvastatin Nausea Only  . Pravastatin Sodium Nausea Only   Family History  Problem Relation Age of Onset  . Diabetes Sister   . Colon cancer Neg Hx     Current Outpatient Medications (Endocrine & Metabolic):  .  linagliptin (TRADJENTA) 5 MG TABS tablet, Take 1 tablet (5 mg total) by mouth daily.  Current Outpatient Medications (Cardiovascular):  .  amLODipine (NORVASC) 10 MG tablet, TAKE 1 TABLET BY MOUTH EVERY DAY. NEEDS OFFICE VISIT FOR FURTHER REFILLS. (Patient taking differently: TAKE 10 MG BY MOUTH EVERY DAY) .  atorvastatin (LIPITOR) 80 MG  tablet, Take 1 tablet (80 mg total) by mouth daily. .  carvedilol (COREG) 25 MG tablet, TAKE 1 TABLET BY MOUTH TWICE DAILY WITH A MEAL. NEEDS OFFICE VISIT FOR FURTHER REFILLS. (Patient taking differently: TAKE 25 MG BY MOUTH TWICE DAILY WITH A MEAL) .  colesevelam (WELCHOL) 625 MG tablet, TAKE 3 TABLETS BY MOUTH  TWICE A DAY WITH A MEAL .  tadalafil (CIALIS) 5 MG tablet, TAKE 1 TABLET(5 MG) BY MOUTH DAILY .  terazosin (HYTRIN) 5 MG capsule, Take 5 mg by mouth daily.    Current Outpatient Medications (Analgesics):  Marland Kitchen  ASPIRIN 81 PO, Take by mouth. .  meloxicam (MOBIC) 15 MG tablet, TAKE 1 TABLET(15 MG) BY MOUTH DAILY .  naproxen sodium (ALEVE) 220 MG tablet, Take 440 mg by mouth daily as needed (for pain or headache).    Current Outpatient Medications (Other):  Marland Kitchen  Cholecalciferol (VITAMIN D3) 2000 units capsule, Take 1 capsule (2,000 Units total) by mouth daily. .  hydrocortisone (ANUSOL-HC) 25 MG suppository, Place 1 suppository (25 mg total) rectally at bedtime as needed for hemorrhoids or anal itching. Marland Kitchen  OVER THE COUNTER MEDICATION, Take 1 capsule by mouth daily. Suprema Dophilus Supplement .  pantoprazole (PROTONIX) 40 MG tablet, TAKE 1 TABLET(40 MG) BY MOUTH DAILY .  gabapentin (NEURONTIN) 100 MG capsule, Take 2 capsules (200 mg total) by mouth at bedtime.    Past medical history, social, surgical and family history all reviewed in electronic medical record.  No pertanent information unless stated regarding to the chief complaint.   Review of Systems:  No headache, visual changes, nausea, vomiting, diarrhea, constipation, dizziness, abdominal pain, skin rash, fevers, chills, night sweats, weight loss, swollen lymph nodes, body aches, joint swelling, , chest pain, shortness of breath, mood changes.  Positive muscle aches  Objective  Blood pressure 116/90, pulse 71, height  (1.753 m), weight 201 lb (91.2 kg), SpO2 97 %.    General: No apparent distress alert and oriented x3  mood and affect normal, dressed appropriately.  HEENT: Pupils equal, extraocular movements intact  Respiratory: Patient's speak in full sentences and does not appear short of breath  Cardiovascular: No lower extremity edema, non tender, no erythema  Skin: Warm dry intact with no signs of infection or rash on extremities or on axial skeleton.  Abdomen: Soft nontender  Neuro: Cranial nerves II through XII are intact, neurovascularly intact in all extremities with 2+ DTRs and 2+ pulses.  Lymph: No lymphadenopathy of posterior or anterior cervical chain or axillae bilaterally.  Gait normal with good balance and coordination.  MSK:  Non tender with full range of motion and good stability and symmetric strength and tone of shoulders,  elbows, wrist,  knee and ankles bilaterally.  Hip: Right hip ROM Patient's right hip is 5 degrees of internal rotation with increasing discomfort along the posterior lateral aspect.  Tenderness over the piriformis.  Still mild pain in the gluteal area as well.  Negative straight leg test.  Has full active external range of motion secondary to tightness of the musculature   Procedure: Real-time Ultrasound Guided Injection of right piriformis tendon sheath Device: GE Logiq Q7 Ultrasound guided injection is preferred based studies that show increased duration, increased effect, greater accuracy, decreased procedural pain, increased response rate, and decreased cost with ultrasound guided versus blind injection.  Verbal informed consent obtained.  Time-out conducted.  Noted no overlying erythema, induration, or other signs of local infection.  Skin prepped in a sterile fashion.  Local anesthesia: Topical Ethyl chloride.  With sterile technique and under real time ultrasound guidance: With a 21-gauge 2 inch needle patient was injected with 1 cc of 0.5% Marcaine and 1 cc of Kenalog 40 mg/mL Completed without difficulty  Pain immediately resolved suggesting accurate  placement of the medication.  Advised to call if fevers/chills, erythema, induration, drainage, or persistent bleeding.  Images permanently stored and available for review in the ultrasound unit.  Impression: Technically successful ultrasound guided injection.    Impression and Recommendations:     This case required medical decision making of moderate complexity. The above documentation has been reviewed and is accurate and complete Judi Saa, DO       Note: This dictation was prepared with Dragon dictation along with smaller phrase technology. Any transcriptional errors that result from this process are unintentional.

## 2019-03-11 NOTE — Patient Instructions (Addendum)
God to see you  Jacob Gordon is your friend Stay active  Talk to ortho and see what they say  Gabapentin 200mg  at night to help as well  Send me messages if you need me

## 2019-03-11 NOTE — Assessment & Plan Note (Signed)
Patient given a piriformis injection today.  Tolerated procedure well.  I do believe that the underlying arthritis of the hip is likely contributing to most of the discomfort and pain.  We discussed which activities of doing which was to avoid.  Patient is to increase activity slowly.  Follow-up again in 4 to 8 weeks but likely will need surgery

## 2019-03-12 ENCOUNTER — Encounter: Payer: Self-pay | Admitting: Family Medicine

## 2019-03-12 NOTE — Assessment & Plan Note (Signed)
1.  Arthritis of the hip.  Discussed with patient again at great length.  I do believe that this is the underlying cause of most of his discomfort and pain.  I do think we will need to consider the possibility of replacement in the near future.  Patient is willing to meet with a orthopedic physician and is following up in 1 week.

## 2019-03-22 ENCOUNTER — Telehealth (INDEPENDENT_AMBULATORY_CARE_PROVIDER_SITE_OTHER): Payer: 59 | Admitting: Cardiology

## 2019-03-22 ENCOUNTER — Other Ambulatory Visit: Payer: Self-pay

## 2019-03-22 ENCOUNTER — Telehealth: Payer: Self-pay | Admitting: Cardiology

## 2019-03-22 ENCOUNTER — Encounter: Payer: Self-pay | Admitting: Cardiology

## 2019-03-22 VITALS — BP 121/83 | HR 74 | Ht 69.0 in | Wt 198.0 lb

## 2019-03-22 DIAGNOSIS — I10 Essential (primary) hypertension: Secondary | ICD-10-CM

## 2019-03-22 DIAGNOSIS — I2510 Atherosclerotic heart disease of native coronary artery without angina pectoris: Secondary | ICD-10-CM | POA: Diagnosis not present

## 2019-03-22 DIAGNOSIS — E785 Hyperlipidemia, unspecified: Secondary | ICD-10-CM

## 2019-03-22 DIAGNOSIS — E119 Type 2 diabetes mellitus without complications: Secondary | ICD-10-CM

## 2019-03-22 NOTE — Telephone Encounter (Signed)
Pt. Has smart phone.    Virtual Visit Pre-Appointment Phone Call  "(Name), I am calling you today to discuss your upcoming appointment. We are currently trying to limit exposure to the virus that causes COVID-19 by seeing patients at home rather than in the office."  1. "What is the BEST phone number to call the day of the visit?" - include this in appointment notes  2. Do you have or have access to (through a family member/friend) a smartphone with video capability that we can use for your visit?" a. If yes - list this number in appt notes as cell (if different from BEST phone #) and list the appointment type as a VIDEO visit in appointment notes b. If no - list the appointment type as a PHONE visit in appointment notes  3. Confirm consent - "In the setting of the current Covid19 crisis, you are scheduled for a (phone or video) visit with your provider on (date) at (time).  Just as we do with many in-office visits, in order for you to participate in this visit, we must obtain consent.  If you'd like, I can send this to your mychart (if signed up) or email for you to review.  Otherwise, I can obtain your verbal consent now.  All virtual visits are billed to your insurance company just like a normal visit would be.  By agreeing to a virtual visit, we'd like you to understand that the technology does not allow for your provider to perform an examination, and thus may limit your provider's ability to fully assess your condition. If your provider identifies any concerns that need to be evaluated in person, we will make arrangements to do so.  Finally, though the technology is pretty good, we cannot assure that it will always work on either your or our end, and in the setting of a video visit, we may have to convert it to a phone-only visit.  In either situation, we cannot ensure that we have a secure connection.  Are you willing to proceed?"  YES 4.  5. Advise patient to be prepared - "Two hours prior  to your appointment, go ahead and check your blood pressure, pulse, oxygen saturation, and your weight (if you have the equipment to check those) and write them all down. When your visit starts, your provider will ask you for this information. If you have an Apple Watch or Kardia device, please plan to have heart rate information ready on the day of your appointment. Please have a pen and paper handy nearby the day of the visit as well."  6. Give patient instructions for MyChart download to smartphone OR [email or web address] as below if video visit (depending on what platform provider is using)  7. Inform patient they will receive a phone call 15 minutes prior to their appointment time (may be from unknown caller ID) so they should be prepared to answer    TELEPHONE CALL NOTE  Jacob Gordon Brunner has been deemed a candidate for a follow-up tele-health visit to limit community exposure during the Covid-19 pandemic. I spoke with the patient via phone to ensure availability of phone/video source, confirm preferred email & phone number, and discuss instructions and expectations.  I reminded Jacob Gordon Ysaguirre to be prepared with any vital sign and/or heart rhythm information that could potentially be obtained via home monitoring, at the time of his visit. I reminded Jacob Gordon Schnitzer to expect a phone call prior to his visit.  Scherrie BatemanShameeka Johnson 03/22/2019  10:00 AM   INSTRUCTIONS FOR DOWNLOADING THE MYCHART APP TO SMARTPHONE  - The patient must first make sure to have activated MyChart and know their login information - If Apple, go to [employer] and type in MyChart in the search bar and download the app. If Android, ask patient to go to [employer] and type in [place] in the search bar and download the app. The app is free but as with any other app downloads, their phone may require them to verify saved payment information or Apple/Android password.  - The patient will need to then log into the  app with their MyChart username and password, and select [facility] as their healthcare provider to link the account. When it is time for your visit, go to the MyChart app, find appointments, and click Begin Video Visit. Be sure to Select Allow for your device to access the Microphone and Camera for your visit. You will then be connected, and your provider will be with you shortly.  **If they have any issues connecting, or need assistance please contact MyChart service desk [phone])**  **If using a computer, in order to ensure the best quality for their visit they will need to use either of the following Internet Browsers: [employer] or Google Chrome**  IF USING DOXIMITY or DOXY.ME - The patient will receive a link just prior to their visit by text.     FULL LENGTH CONSENT FOR TELE-HEALTH VISIT   I hereby voluntarily request, consent and authorize [facility] and its employed or contracted physicians, physician assistants, nurse practitioners or other licensed health care professionals (the Practitioner), to provide me with telemedicine health care services (the Services") as deemed necessary by the treating Practitioner. I acknowledge and consent to receive the Services by the Practitioner via telemedicine. I understand that the telemedicine visit will involve communicating with the Practitioner through live audiovisual communication technology and the disclosure of certain medical information by electronic transmission. I acknowledge that I have been given the opportunity to request an in-person assessment or other available alternative prior to the telemedicine visit and am voluntarily participating in the telemedicine visit.  I understand that I have the right to withhold or withdraw my consent to the use of telemedicine in the course of my care at any time, without affecting my right to future care or treatment, and that the Practitioner or I may terminate the  telemedicine visit at any time. I understand that I have the right to inspect all information obtained and/or recorded in the course of the telemedicine visit and may receive copies of available information for a reasonable fee.  I understand that some of the potential risks of receiving the Services via telemedicine include:   Delay or interruption in medical evaluation due to technological equipment failure or disruption;  Information transmitted may not be sufficient (e.g. poor resolution of images) to allow for appropriate medical decision making by the Practitioner; and/or   In rare instances, security protocols could fail, causing a breach of personal health information.  Furthermore, I acknowledge that it is my responsibility to provide information about my medical history, conditions and care that is complete and accurate to the best of my ability. I acknowledge that Practitioner's advice, recommendations, and/or decision may be based on factors not within their control, such as incomplete or inaccurate data provided by me or distortions of diagnostic images or specimens that may result from electronic transmissions. I understand that the practice of medicine is  not an Visual merchandiser and that Practitioner makes no warranties or guarantees regarding treatment outcomes. I acknowledge that I will receive a copy of this consent concurrently upon execution via email to the email address I last provided but may also request a printed copy by calling the office of [facility].    I understand that my insurance will be billed for this visit.   I have read or had this consent read to me.  I understand the contents of this consent, which adequately explains the benefits and risks of the Services being provided via telemedicine.   I have been provided ample opportunity to ask questions regarding this consent and the Services and have had my questions answered to my satisfaction.  I give my informed  consent for the services to be provided through the use of telemedicine in my medical care  By participating in this telemedicine visit I agree to the above.

## 2019-03-22 NOTE — Telephone Encounter (Signed)
Virtual appt scheduled for today with Dr Anne Fu.

## 2019-03-22 NOTE — Progress Notes (Signed)
Virtual Visit via Video Note   This visit type was conducted due to national recommendations for restrictions regarding the COVID-19 Pandemic (e.g. social distancing) in an effort to limit this patient's exposure and mitigate transmission in our community.  Due to his co-morbid illnesses, this patient is at least at moderate risk for complications without adequate follow up.  This format is felt to be most appropriate for this patient at this time.  All issues noted in this document were discussed and addressed.  A limited physical exam was performed with this format.  Please refer to the patient's chart for his consent to telehealth for [facility].   Date:  03/22/2019   ID:  Jacob Gordon, DOB 1956/03/10, MRN [id number]  Patient Location: Home Provider Location: Home  PCP:  Plotnikov, Georgina Quint, MD  Cardiologist:  Donato Schultz, MD  Electrophysiologist:  None   Evaluation Performed:  Follow-Up Visit  Chief Complaint:  CAD follow up  History of Present Illness:    Jacob Gordon is a 63 y.o. male with coronary artery disease here for follow-up.  In March 2019 underwent cardiac catheterization after receiving a CT of the coronary arteries demonstrating abnormal FFR analysis in the mid to distal LAD, this was reviewed by catheterization.  Overall ejection fraction normal 60%.  Inferior Q waves noted.  Family members with MI, mother, older sister, younger brother.  Overall his main complaint is right hip pain that has been bothering him.  He has been receiving steroid injections periodically.  Eventually, he states that he will need to have this taken care of surgically.  This may have been in the summertime.  If he does, I am comfortable with him proceeding, see below.  Reports no myalgias, no chest pain no shortness of breath.  Seems to be doing quite well.   At prior visit we discussed potential Ranexa, antianginals given his continued ongoing shortness of breath up the  stairs. The patient does not have symptoms concerning for COVID-19 infection (fever, chills, cough, or new shortness of breath).    Past Medical History:  Diagnosis Date  . ALLERGIC RHINITIS   . Chronic headaches   . DEGENERATIVE DISC DISEASE   . DIABETES MELLITUS, TYPE II   . GERD   . History of kidney stones   . HLD (hyperlipidemia)   . HYPERTENSION   . Internal hemorrhoids   . OSTEOARTHRITIS   . THROMBOCYTOPENIA    Past Surgical History:  Procedure Laterality Date  . COLONOSCOPY  10/01/11   internal hemorrhoids  . LEFT HEART CATH AND CORONARY ANGIOGRAPHY N/A 01/07/2018   Procedure: LEFT HEART CATH AND CORONARY ANGIOGRAPHY;  Surgeon: Kathleene Hazel, MD;  Location: [facility];  Service: Cardiovascular;  Laterality: N/A;  . SHOULDER SURGERY     right rotator cuff surg  . TONSILLECTOMY AND ADENOIDECTOMY    . VASECTOMY       Current Meds  Medication Sig  . amLODipine (NORVASC) 10 MG tablet TAKE 1 TABLET BY MOUTH EVERY DAY. NEEDS OFFICE VISIT FOR FURTHER REFILLS. (Patient taking differently: TAKE 10 MG BY MOUTH EVERY DAY)  . ASPIRIN 81 PO Take 81 mg by mouth 2 (two) times a day.   Marland Kitchen atorvastatin (LIPITOR) 80 MG tablet Take 1 tablet (80 mg total) by mouth daily.  . carvedilol (COREG) 25 MG tablet TAKE 1 TABLET BY MOUTH TWICE DAILY WITH A MEAL. NEEDS OFFICE VISIT FOR FURTHER REFILLS. (Patient taking differently: TAKE 25 MG BY MOUTH TWICE  DAILY WITH A MEAL)  . Cholecalciferol (VITAMIN D3) 2000 units capsule Take 1 capsule (2,000 Units total) by mouth daily.  . colesevelam (WELCHOL) 625 MG tablet TAKE 3 TABLETS BY MOUTH  TWICE A DAY WITH A MEAL  . gabapentin (NEURONTIN) 100 MG capsule Take 2 capsules (200 mg total) by mouth at bedtime.  Marland Kitchen. linagliptin (TRADJENTA) 5 MG TABS tablet Take 1 tablet (5 mg total) by mouth daily.  . meloxicam (MOBIC) 15 MG tablet TAKE 1 TABLET(15 MG) BY MOUTH DAILY  . naproxen sodium (ALEVE) 220 MG tablet Take 440 mg by mouth daily as needed  (for pain or headache).   . OVER THE COUNTER MEDICATION Take 1 capsule by mouth daily. Suprema Dophilus Supplement  . pantoprazole (PROTONIX) 40 MG tablet TAKE 1 TABLET(40 MG) BY MOUTH DAILY  . tadalafil (CIALIS) 5 MG tablet TAKE 1 TABLET(5 MG) BY MOUTH DAILY  . terazosin (HYTRIN) 5 MG capsule Take 10 mg by mouth daily.   . [DISCONTINUED] hydrocortisone (ANUSOL-HC) 25 MG suppository Place 1 suppository (25 mg total) rectally at bedtime as needed for hemorrhoids or anal itching.     Allergies:   Tape; Sulfa antibiotics; Valsartan; Amlodipine-atorvastatin; and Pravastatin sodium   Social History   Tobacco Use  . Smoking status: Never Smoker  . Smokeless tobacco: Never Used  Substance Use Topics  . Alcohol use: No  . Drug use: No     Family Hx: The patient's family history includes Diabetes in his sister. There is no history of Colon cancer.  ROS:   Please see the history of present illness.    Denies any fevers chills nausea vomiting syncope bleeding All other systems reviewed and are negative.   Prior CV studies:   The following studies were reviewed today:  Vascular ABIs 08/13/2018: No evidence of peripheral vascular disease.  Labs/Other Tests and Data Reviewed:    EKG:  An ECG dated 01/02/18 was personally reviewed today and demonstrated:  NSR  Recent Labs: 01/18/2019: ALT 24; BUN 20; Creatinine, Ser 1.23; Hemoglobin 16.2; Platelets 125.0; Potassium 4.0; Sodium 141; TSH 2.11   Recent Lipid Panel Lab Results  Component Value Date/Time   CHOL 111 01/18/2019 07:32 AM   CHOL 169 04/06/2018 09:01 AM   TRIG 59.0 01/18/2019 07:32 AM   HDL 45.80 01/18/2019 07:32 AM   HDL 45 04/06/2018 09:01 AM   CHOLHDL 2 01/18/2019 07:32 AM   LDLCALC 54 01/18/2019 07:32 AM   LDLCALC 106 (H) 04/06/2018 09:01 AM   LDLDIRECT 184.1 10/20/2012 07:47 AM    Wt Readings from Last 3 Encounters:  03/22/19 198 lb (89.8 kg)  03/11/19 201 lb (91.2 kg)  02/04/19 205 lb (93 kg)     Objective:     Vital Signs:  BP 121/83   Pulse 74   Ht 5\' 9"  (1.753 m)   Wt 198 lb (89.8 kg)   BMI 29.24 kg/m    VITAL SIGNS:  reviewed GEN:  no acute distress EYES:  sclerae anicteric, EOMI - Extraocular Movements Intact RESPIRATORY:  normal respiratory effort, symmetric expansion SKIN:  no rash, lesions or ulcers. MUSCULOSKELETAL:  no obvious deformities. NEURO:  alert and oriented x 3, no obvious focal deficit PSYCH:  normal affect  ASSESSMENT & PLAN:    Moderate non-flow-limiting coronary artery disease - Continue with aggressive secondary risk factor prevention. -Increased atorvastatin 80. - Unable to use isosorbide because of once daily Cialis. - Consider Ranexa in the future if need be. For now doing well on Bb.  Diabetes with hypertension -Excellent use of statin.  Aspirin.  Continue with secondary prevention.  Medications reviewed as prescribed.  Hyperlipidemia -Atorvastatin 80.  Last LDL on 01/18/2019 was 54.  Excellent. No myalgias  Right hip pain - Receiving cortisone injections. -Eventually he will require surgical procedure.  From a cardiac perspective, I am comfortable with him proceeding with mild to moderate overall cardiac risk given his moderate non-flow-limiting coronary artery disease.  He may proceed.  COVID-19 Education: The signs and symptoms of COVID-19 were discussed with the patient and how to seek care for testing (follow up with PCP or arrange E-visit).  The importance of social distancing was discussed today.  Time:   Today, I have spent 14 minutes with the patient with telehealth technology discussing the above problems.     Medication Adjustments/Labs and Tests Ordered: Current medicines are reviewed at length with the patient today.  Concerns regarding medicines are outlined above.   Tests Ordered: No orders of the defined types were placed in this encounter.   Medication Changes: No orders of the defined types were placed in this encounter.    Disposition:  Follow up in 1 year(s)  Signed, Donato Schultz, MD  03/22/2019 4:18 PM     [facility]

## 2019-03-22 NOTE — Patient Instructions (Signed)
  Medication Instructions:  The current medical regimen is effective;  continue present plan and medications.  If you need a refill on your cardiac medications before your next appointment, please call your pharmacy.   Follow-Up: Follow up in 1 year with Dr. Skains.  You will receive a letter in the mail 2 months before you are due.  Please call us when you receive this letter to schedule your follow up appointment.  Thank you for choosing [facility]!!     

## 2019-03-23 ENCOUNTER — Other Ambulatory Visit: Payer: Self-pay | Admitting: Internal Medicine

## 2019-04-05 ENCOUNTER — Other Ambulatory Visit: Payer: Self-pay | Admitting: *Deleted

## 2019-04-05 MED ORDER — LINAGLIPTIN 5 MG PO TABS: 5.0000 mg | ORAL_TABLET | Freq: Every day | ORAL | 2 refills | Status: DC

## 2019-05-10 ENCOUNTER — Other Ambulatory Visit: Payer: Self-pay | Admitting: Cardiology

## 2019-06-03 ENCOUNTER — Telehealth: Payer: Self-pay | Admitting: Cardiology

## 2019-06-03 NOTE — Telephone Encounter (Signed)
   [facility] Pre-operative Risk Assessment    Request for surgical clearance:  1. What type of surgery is being performed? Right total hip arthroplasty   2. When is this surgery scheduled? 07/28/2019   3. What type of clearance is required (medical clearance vs. Pharmacy clearance to hold med vs. Both)? Medical  4. Are there any medications that need to be held prior to surgery and how long? None   5. Practice name and name of physician performing surgery? [facility]- Dr. Maureen Ralphs   6. What is your office phone number? [phone]    7.   What is your office fax number? [place]  8.   Anesthesia type (None, local, MAC, general) ?  Spinal  _________________________________________________________________   (provider comments below)

## 2019-06-04 NOTE — Telephone Encounter (Signed)
   Primary Cardiologist: Candee Furbish, MD  Chart reviewed as part of pre-operative protocol coverage. Patient was contacted 06/04/2019 in reference to pre-operative risk assessment for pending surgery as outlined below.  Jacob Gordon was last seen on 03/22/19  by Dr Gillian Shields and he was cleared for surgery at that time. Note outlines: Right hip pain - Receiving cortisone injections. -Eventually he will require surgical procedure.  From a cardiac perspective, I am comfortable with him proceeding with mild to moderate overall cardiac risk given his moderate non-flow-limiting coronary artery disease.  He may proceed.  Since that day, Jacob Gordon has done well w/o any angina. No exertional CP or dyspnea.   Therefore, based on ACC/AHA guidelines, the patient would be at acceptable risk for the planned procedure without further cardiovascular testing.   We would prefer continuation of ASA during the perioperative period, however if absolutely necessary due to elevated bleed risk, it would be ok to hold 7 days prior to surgery.   I will route this recommendation to the requesting party via Epic fax function and remove from pre-op pool.  Please call with questions.  Lyda Jester, PA-C 06/04/2019, 9:14 AM

## 2019-06-10 ENCOUNTER — Other Ambulatory Visit (INDEPENDENT_AMBULATORY_CARE_PROVIDER_SITE_OTHER): Payer: BC Managed Care – PPO

## 2019-06-10 ENCOUNTER — Other Ambulatory Visit: Payer: Self-pay

## 2019-06-10 ENCOUNTER — Encounter: Payer: Self-pay | Admitting: Infectious Diseases

## 2019-06-10 ENCOUNTER — Ambulatory Visit (INDEPENDENT_AMBULATORY_CARE_PROVIDER_SITE_OTHER): Payer: BC Managed Care – PPO | Admitting: Internal Medicine

## 2019-06-10 ENCOUNTER — Encounter: Payer: Self-pay | Admitting: Internal Medicine

## 2019-06-10 VITALS — BP 110/70 | HR 73 | Temp 97.2°F | Ht 69.0 in | Wt 203.0 lb

## 2019-06-10 DIAGNOSIS — Z01818 Encounter for other preprocedural examination: Secondary | ICD-10-CM | POA: Diagnosis not present

## 2019-06-10 DIAGNOSIS — E1121 Type 2 diabetes mellitus with diabetic nephropathy: Secondary | ICD-10-CM

## 2019-06-10 DIAGNOSIS — I2510 Atherosclerotic heart disease of native coronary artery without angina pectoris: Secondary | ICD-10-CM | POA: Diagnosis not present

## 2019-06-10 DIAGNOSIS — E119 Type 2 diabetes mellitus without complications: Secondary | ICD-10-CM

## 2019-06-10 DIAGNOSIS — I10 Essential (primary) hypertension: Secondary | ICD-10-CM

## 2019-06-10 LAB — CBC WITH DIFFERENTIAL/PLATELET
Basophils Absolute: 0 10*3/uL (ref 0.0–0.1)
Basophils Relative: 0.5 % (ref 0.0–3.0)
Eosinophils Absolute: 0.2 10*3/uL (ref 0.0–0.7)
Eosinophils Relative: 2.5 % (ref 0.0–5.0)
HCT: 45.3 % (ref 39.0–52.0)
Hemoglobin: 15.3 g/dL (ref 13.0–17.0)
Lymphocytes Relative: 25.4 % (ref 12.0–46.0)
Lymphs Abs: 1.6 10*3/uL (ref 0.7–4.0)
MCHC: 33.8 g/dL (ref 30.0–36.0)
MCV: 96 fl (ref 78.0–100.0)
Monocytes Absolute: 0.6 10*3/uL (ref 0.1–1.0)
Monocytes Relative: 9.3 % (ref 3.0–12.0)
Neutro Abs: 4 10*3/uL (ref 1.4–7.7)
Neutrophils Relative %: 62.3 % (ref 43.0–77.0)
Platelets: 130 10*3/uL — ABNORMAL LOW (ref 150.0–400.0)
RBC: 4.72 Mil/uL (ref 4.22–5.81)
RDW: 14.4 % (ref 11.5–15.5)
WBC: 6.4 10*3/uL (ref 4.0–10.5)

## 2019-06-10 LAB — PROTIME-INR
INR: 1.1 ratio — ABNORMAL HIGH (ref 0.8–1.0)
Prothrombin Time: 13.2 s — ABNORMAL HIGH (ref 9.6–13.1)

## 2019-06-10 LAB — HEPATIC FUNCTION PANEL
ALT: 23 U/L (ref 0–53)
AST: 19 U/L (ref 0–37)
Albumin: 4.2 g/dL (ref 3.5–5.2)
Alkaline Phosphatase: 81 U/L (ref 39–117)
Bilirubin, Direct: 0.3 mg/dL (ref 0.0–0.3)
Total Bilirubin: 1.1 mg/dL (ref 0.2–1.2)
Total Protein: 6.7 g/dL (ref 6.0–8.3)

## 2019-06-10 LAB — BASIC METABOLIC PANEL
BUN: 17 mg/dL (ref 6–23)
CO2: 28 mEq/L (ref 19–32)
Calcium: 9.5 mg/dL (ref 8.4–10.5)
Chloride: 108 mEq/L (ref 96–112)
Creatinine, Ser: 1.37 mg/dL (ref 0.40–1.50)
GFR: 63.58 mL/min (ref >60.00)
Glucose, Bld: 112 mg/dL — ABNORMAL HIGH (ref 70–99)
Potassium: 4 mEq/L (ref 3.5–5.1)
Sodium: 142 mEq/L (ref 135–145)

## 2019-06-10 LAB — HEMOGLOBIN A1C: Hgb A1c MFr Bld: 6.8 % — ABNORMAL HIGH (ref 4.6–6.5)

## 2019-06-10 LAB — TSH: TSH: 0.93 u[IU]/mL (ref 0.35–4.50)

## 2019-06-10 LAB — VITAMIN D 25 HYDROXY (VIT D DEFICIENCY, FRACTURES): VITD: 39.64 ng/mL (ref 30.00–100.00)

## 2019-06-10 NOTE — Assessment & Plan Note (Addendum)
The patient should be clear medically for his right hip surgery.  He would be in the low risk category for perioperative complications.  He has chronic, mild thrombocytopenia.  His pro time/INR was slightly elevated-likely due to aspirin use.  You can instruct him to discontinue aspirin prior to surgery per your regular protocol. He is EKG is unchanged from previous.  There is no angina symptoms. Thank you!

## 2019-06-10 NOTE — Assessment & Plan Note (Signed)
A1c

## 2019-06-10 NOTE — Patient Instructions (Signed)

## 2019-06-10 NOTE — Progress Notes (Signed)
Subjective:  Patient ID: Jacob Gordon, male    DOB: January 06, 1956  Age: 63 y.o. MRN: [id number]  CC: No chief complaint on file.   HPI Jacob DawleyLinwood Matthew Favor presents for a pre-op IM consultation Reason  R THA planned on 07/28/19 Req by Dr Lequita HaltAluisio Hx history of hypertension, coronary artery disease, type 2 diabetes, chronic mild thrombocytopenia-all stable  Past Medical History:  Diagnosis Date  . ALLERGIC RHINITIS   . Chronic headaches   . DEGENERATIVE DISC DISEASE   . DIABETES MELLITUS, TYPE II   . GERD   . History of kidney stones   . HLD (hyperlipidemia)   . HYPERTENSION   . Internal hemorrhoids   . OSTEOARTHRITIS   . THROMBOCYTOPENIA    Past Surgical History:  Procedure Laterality Date  . COLONOSCOPY  10/01/11   internal hemorrhoids  . LEFT HEART CATH AND CORONARY ANGIOGRAPHY N/A 01/07/2018   Procedure: LEFT HEART CATH AND CORONARY ANGIOGRAPHY;  Surgeon: Kathleene HazelMcAlhany, Christopher D, MD;  Location: [facility];  Service: Cardiovascular;  Laterality: N/A;  . SHOULDER SURGERY     right rotator cuff surg  . TONSILLECTOMY AND ADENOIDECTOMY    . VASECTOMY      reports that he has never smoked. He has never used smokeless tobacco. He reports that he does not drink alcohol or use drugs. family history includes Diabetes in his sister. Allergies  Allergen Reactions  . Tape Other (See Comments)    ADHESIVE   . Sulfa Antibiotics Other (See Comments)    Unknown  . Valsartan Other (See Comments)    Unknown  . Amlodipine-Atorvastatin Nausea Only  . Pravastatin Sodium Nausea Only     Outpatient Medications Prior to Visit  Medication Sig Dispense Refill  . amLODipine (NORVASC) 10 MG tablet TAKE 1 TABLET BY MOUTH EVERY DAY. NEEDS OFFICE VISIT FOR FURTHER REFILLS. (Patient taking differently: TAKE 10 MG BY MOUTH EVERY DAY) 90 tablet 2  . ASPIRIN 81 PO Take 81 mg by mouth 2 (two) times a day.     Marland Kitchen. atorvastatin (LIPITOR) 80 MG tablet TAKE 1 TABLET(80 MG) BY MOUTH DAILY  90 tablet 3  . carvedilol (COREG) 25 MG tablet TAKE 1 TABLET BY MOUTH TWICE DAILY WITH A MEAL. NEEDS OFFICE VISIT FOR FURTHER REFILLS. (Patient taking differently: TAKE 25 MG BY MOUTH TWICE DAILY WITH A MEAL) 180 tablet 0  . Cholecalciferol (VITAMIN D3) 2000 units capsule Take 1 capsule (2,000 Units total) by mouth daily. 100 capsule 3  . colesevelam (WELCHOL) 625 MG tablet TAKE 3 TABLETS BY MOUTH  TWICE A DAY WITH A MEAL 540 tablet 1  . gabapentin (NEURONTIN) 100 MG capsule Take 2 capsules (200 mg total) by mouth at bedtime. 60 capsule 3  . linagliptin (TRADJENTA) 5 MG TABS tablet Take 1 tablet (5 mg total) by mouth daily. 90 tablet 2  . meloxicam (MOBIC) 15 MG tablet TAKE 1 TABLET(15 MG) BY MOUTH DAILY 30 tablet 2  . naproxen sodium (ALEVE) 220 MG tablet Take 440 mg by mouth daily as needed (for pain or headache).     . OVER THE COUNTER MEDICATION Take 1 capsule by mouth daily. Suprema Dophilus Supplement    . pantoprazole (PROTONIX) 40 MG tablet TAKE 1 TABLET(40 MG) BY MOUTH DAILY 90 tablet 3  . tadalafil (CIALIS) 5 MG tablet TAKE 1 TABLET(5 MG) BY MOUTH DAILY 30 tablet 11  . terazosin (HYTRIN) 5 MG capsule Take 10 mg by mouth daily.      No  facility-administered medications prior to visit.     ROS: Review of Systems  Constitutional: Negative for appetite change, fatigue and unexpected weight change.  HENT: Negative for congestion, nosebleeds, sneezing, sore throat and trouble swallowing.   Eyes: Negative for itching and visual disturbance.  Respiratory: Negative for cough, chest tightness, shortness of breath and wheezing.   Cardiovascular: Negative for chest pain, palpitations and leg swelling.  Gastrointestinal: Negative for abdominal distention, blood in stool, diarrhea and nausea.  Genitourinary: Negative for frequency and hematuria.  Musculoskeletal: Positive for arthralgias and gait problem. Negative for back pain, joint swelling and neck pain.  Skin: Negative for rash.   Neurological: Negative for dizziness, tremors, speech difficulty and weakness.  Hematological: Negative for adenopathy. Does not bruise/bleed easily.  Psychiatric/Behavioral: Negative for agitation, dysphoric mood, sleep disturbance and suicidal ideas. The patient is not nervous/anxious.     Objective:  BP 110/70 (BP Location: Left Arm, Patient Position: Sitting, Cuff Size: Large)   Pulse 73   Temp (!) 97.2 F (36.2 C) (Temporal)   Ht 5\' 9"  (1.753 m)   Wt 203 lb (92.1 kg)   SpO2 96%   BMI 29.98 kg/m   BP Readings from Last 3 Encounters:  06/10/19 110/70  03/22/19 121/83  03/11/19 116/90    Wt Readings from Last 3 Encounters:  06/10/19 203 lb (92.1 kg)  03/22/19 198 lb (89.8 kg)  03/11/19 201 lb (91.2 kg)    Physical Exam Constitutional:      General: He is not in acute distress.    Appearance: He is well-developed.     Comments: NAD  Eyes:     Conjunctiva/sclera: Conjunctivae normal.     Pupils: Pupils are equal, round, and reactive to light.  Neck:     Musculoskeletal: Normal range of motion.     Thyroid: No thyromegaly.     Vascular: No JVD.  Cardiovascular:     Rate and Rhythm: Normal rate and regular rhythm.     Heart sounds: Normal heart sounds. No murmur. No friction rub. No gallop.   Pulmonary:     Effort: Pulmonary effort is normal. No respiratory distress.     Breath sounds: Normal breath sounds. No wheezing or rales.  Chest:     Chest wall: No tenderness.  Abdominal:     General: Bowel sounds are normal. There is no distension.     Palpations: Abdomen is soft. There is no mass.     Tenderness: There is no abdominal tenderness. There is no guarding or rebound.  Musculoskeletal: Normal range of motion.        General: Tenderness present.  Lymphadenopathy:     Cervical: No cervical adenopathy.  Skin:    General: Skin is warm and dry.     Findings: No rash.  Neurological:     Mental Status: He is alert and oriented to person, place, and time.      Cranial Nerves: No cranial nerve deficit.     Motor: No abnormal muscle tone.     Coordination: Coordination normal.     Gait: Gait normal.     Deep Tendon Reflexes: Reflexes are normal and symmetric.  Psychiatric:        Behavior: Behavior normal.        Thought Content: Thought content normal.        Judgment: Judgment normal.   Right hip with restricted range of motion   Procedure: EKG Indication: Preop exam.  Coronary artery disease Impression: NSR. No acute changes.  No new changes compared to previous  Lab Results  Component Value Date   WBC 7.3 01/18/2019   HGB 16.2 01/18/2019   HCT 48.7 01/18/2019   PLT 125.0 (L) 01/18/2019   GLUCOSE 117 (H) 01/18/2019   CHOL 111 01/18/2019   TRIG 59.0 01/18/2019   HDL 45.80 01/18/2019   LDLDIRECT 184.1 10/20/2012   LDLCALC 54 01/18/2019   ALT 24 01/18/2019   AST 19 01/18/2019   NA 141 01/18/2019   K 4.0 01/18/2019   CL 104 01/18/2019   CREATININE 1.23 01/18/2019   BUN 20 01/18/2019   CO2 28 01/18/2019   TSH 2.11 01/18/2019   PSA 0.90 01/18/2019   INR 1.1 01/02/2018   HGBA1C 7.1 (H) 01/18/2019   MICROALBUR 0.7 11/21/2008    Vas [place] Le Art Seg Multi (segm&le Reynauds)  Result Date: 08/14/2018 LOWER EXTREMITY DOPPLER STUDY Indications: Patient presents with left foot pain and swelling with              discoloration of the toes on the left foot. High Risk Factors: Hyperlipidemia, Diabetes, no history of smoking, coronary                    artery disease.  Performing Technologist: Chesley Noon RVT  Examination Guidelines: A complete evaluation includes at minimum, Doppler waveform signals and systolic blood pressure reading at the level of bilateral brachial, anterior tibial, and posterior tibial arteries, when vessel segments are accessible. Bilateral testing is considered an integral part of a complete examination. Photoelectric Plethysmograph (PPG) waveforms and toe systolic pressure readings are included as required and  additional duplex testing as needed. Limited examinations for reoccurring indications may be performed as noted.  ABI Findings: +---------+------------------+-----+---------+--------+ Right    Rt Pressure (mmHg)IndexWaveform Comment  +---------+------------------+-----+---------+--------+ Brachial 134                                      +---------+------------------+-----+---------+--------+ CFA                             triphasic         +---------+------------------+-----+---------+--------+ Popliteal                       triphasic         +---------+------------------+-----+---------+--------+ ATA      149               1.11 triphasic         +---------+------------------+-----+---------+--------+ PTA      166               1.24 triphasic         +---------+------------------+-----+---------+--------+ PERO     150               1.12 triphasic         +---------+------------------+-----+---------+--------+ Great Toe115               0.86 Normal            +---------+------------------+-----+---------+--------+ +---------+------------------+-----+---------+-------+ Left     Lt Pressure (mmHg)IndexWaveform Comment +---------+------------------+-----+---------+-------+ Brachial 129                                     +---------+------------------+-----+---------+-------+ CFA  triphasic        +---------+------------------+-----+---------+-------+ Popliteal                       triphasic        +---------+------------------+-----+---------+-------+ ATA      162               1.21 triphasic        +---------+------------------+-----+---------+-------+ PTA      154               1.15 triphasic        +---------+------------------+-----+---------+-------+ PERO     157               1.17 triphasic        +---------+------------------+-----+---------+-------+ Great Toe156               1.16 Normal            +---------+------------------+-----+---------+-------+  Summary: Right: Resting right ankle-brachial index is within normal range. No evidence of significant right lower extremity arterial disease. The right toe-brachial index is normal. Left: Resting left ankle-brachial index is within normal range. No evidence of significant left lower extremity arterial disease. The left toe-brachial index is normal.  *See table(s) above for measurements and observations.  Electronically signed by Lorine BearsMuhammad Arida MD on 08/14/2018 at 1:11:11 PM.    Final     Assessment & Plan:   There are no diagnoses linked to this encounter.   No orders of the defined types were placed in this encounter.    Follow-up: No follow-ups on file.  Sonda PrimesAlex , MD

## 2019-06-10 NOTE — Assessment & Plan Note (Signed)
A1c Diet Trajenta

## 2019-06-12 ENCOUNTER — Encounter: Payer: Self-pay | Admitting: Internal Medicine

## 2019-06-25 ENCOUNTER — Other Ambulatory Visit: Payer: Self-pay

## 2019-06-25 MED ORDER — CARVEDILOL 25 MG PO TABS: 25.0000 mg | ORAL_TABLET | Freq: Two times a day (BID) | ORAL | 0 refills | Status: DC

## 2019-06-29 MED ORDER — COLESEVELAM HCL 625 MG PO TABS: ORAL_TABLET | ORAL | 3 refills | Status: DC

## 2019-06-29 MED ORDER — LINAGLIPTIN 5 MG PO TABS: 5.0000 mg | ORAL_TABLET | Freq: Every day | ORAL | 3 refills | Status: DC

## 2019-07-02 ENCOUNTER — Telehealth: Payer: Self-pay | Admitting: *Deleted

## 2019-07-02 NOTE — Telephone Encounter (Signed)
Tradjenta 5 mg  PA initiated via CoverMyMeds Key: [id number] Pt ID: [id number] Ins ph #: [phone]

## 2019-07-06 ENCOUNTER — Other Ambulatory Visit: Payer: Self-pay | Admitting: Internal Medicine

## 2019-07-13 NOTE — Telephone Encounter (Signed)
Jacob Gordon, did they offer an alternative? Thanks,

## 2019-07-13 NOTE — Telephone Encounter (Signed)
Tradjenta PA is denied. Please advise.

## 2019-07-19 ENCOUNTER — Encounter (HOSPITAL_COMMUNITY): Payer: Self-pay

## 2019-07-19 NOTE — Progress Notes (Addendum)
PCP - Merlinda Frederick Plotnikov  Clearance 06-10-19 epic Cardiologist -   Chest x-ray -  EKG - 06-10-19 epic Stress Test -  ECHO -  Cardiac Cath - 01-07-18 epic  Sleep Study -  CPAP -    hgba1c 6.8 06-10-19 epic  Fasting Blood Sugar -  Checks Blood Sugar _____ times a day  Blood Thinner Instructions: Aspirin Instructions:  Anesthesia review:   Patient denies shortness of breath, fever, cough and chest pain at PAT appointment  none   Patient verbalized understanding of instructions that were given to them at the PAT appointment. Patient was also instructed that they will need to review over the PAT instructions again at home before surgery.

## 2019-07-19 NOTE — Patient Instructions (Addendum)
DUE TO COVID-19 ONLY ONE VISITOR IS ALLOWED TO COME WITH YOU AND STAY IN THE WAITING ROOM ONLY DURING PRE OP AND PROCEDURE DAY OF SURGERY. THE 1 VISITOR MAY VISIT WITH YOU AFTER SURGERY IN YOUR PRIVATE ROOM DURING VISITING HOURS ONLY!  YOU NEED TO HAVE A COVID 19 TEST ON_______ @_______ , THIS TEST MUST BE DONE BEFORE SURGERY, COME  [address] , [address].  ([place]) ONCE YOUR COVID TEST IS COMPLETED, PLEASE BEGIN THE QUARANTINE INSTRUCTIONS AS OUTLINED IN YOUR HANDOUT.                Jacob Gordon  07/19/2019   Your procedure is scheduled on: 07-28-19   Report to [facility] Main  Entrance             Report to admitting at        1000 AM     Call this number if you have problems the morning of surgery [phone]    Remember: NO SOLID FOOD AFTER MIDNIGHT THE NIGHT PRIOR TO SURGERY. NOTHING BY MOUTH EXCEPT CLEAR LIQUIDS UNTIL     0930 am  . PLEASE FINISH ENSURE DRINK PER SURGEON ORDER  WHICH NEEDS TO BE COMPLETED AT    0930 am then nothing by mouth .    CLEAR LIQUID DIET   Foods Allowed                                                                                    Foods Excluded  Coffee and tea, regular and decaf  No creamer                                liquids that you cannot  Plain Jell-O any favor except red or purple                                        see through such as: Fruit ices (not with fruit pulp)                                                                milk, soups, orange juice  Iced Popsicles                                                                 All solid food Carbonated beverages, regular and diet                                    Cranberry,  grape and apple juices Sports drinks like Gatorade Lightly seasoned clear broth or consume(fat free) Sugar, honey syrup   _____________________________________________________________________     BRUSH YOUR TEETH MORNING OF SURGERY AND RINSE YOUR  MOUTH OUT, NO CHEWING GUM CANDY OR MINTS.     Take these medicines the morning of surgery with A SIP OF WATER: amlodipine   (norvasc), carvedilol (coreg), protonix(pantaprozole)  DO NOT TAKE ANY DIABETIC MEDICATIONS DAY OF YOUR SURGERY                               You may not have any metal on your body including hair pins and              piercings  Do not wear jewelry,  lotions, powders or perfumes, deodorant .              Men may shave face and neck.   Do not bring valuables to the hospital.  IS NOT             RESPONSIBLE   FOR VALUABLES.  Contacts, dentures or bridgework may not be worn into surgery.                 Please read over the following fact sheets you were given: _____________________________________________________________________             [facility] - Preparing for Surgery Before surgery, you can play an important role.  Because skin is not sterile, your skin needs to be as free of germs as possible.  You can reduce the number of germs on your skin by washing with CHG (chlorahexidine gluconate) soap before surgery.  CHG is an antiseptic cleaner which kills germs and bonds with the skin to continue killing germs even after washing. Please DO NOT use if you have an allergy to CHG or antibacterial soaps.  If your skin becomes reddened/irritated stop using the CHG and inform your nurse when you arrive at Short Stay. Do not shave (including legs and underarms) for at least 48 hours prior to the first CHG shower.  You may shave your face/neck. Please follow these instructions carefully:  1.  Shower with CHG Soap the night before surgery and the  morning of Surgery.  2.  If you choose to wash your hair, wash your hair first as usual with your  normal  shampoo.  3.  After you shampoo, rinse your hair and body thoroughly to remove the  shampoo.                           4.  Use CHG as you would any other liquid soap.  You can apply chg directly  to the skin and  wash                       Gently with a scrungie or clean washcloth.  5.  Apply the CHG Soap to your body ONLY FROM THE NECK DOWN.   Do not use on face/ open                           Wound or open sores. Avoid contact with eyes, ears mouth and genitals (private parts).  Wash face,  Genitals (private parts) with your normal soap.             6.  Wash thoroughly, paying special attention to the area where your surgery  will be performed.  7.  Thoroughly rinse your body with warm water from the neck down.  8.  DO NOT shower/wash with your normal soap after using and rinsing off  the CHG Soap.                9.  Pat yourself dry with a clean towel.            10.  Wear clean pajamas.            11.  Place clean sheets on your bed the night of your first shower and do not  sleep with pets. Day of Surgery : Do not apply any lotions/deodorants the morning of surgery.  Please wear clean clothes to the hospital/surgery center.  FAILURE TO FOLLOW THESE INSTRUCTIONS MAY RESULT IN THE CANCELLATION OF YOUR SURGERY PATIENT SIGNATURE_________________________________  NURSE SIGNATURE__________________________________  ________________________________________________________________________   Jacob MireIncentive Spirometer  An incentive spirometer is a tool that can help keep your lungs clear and active. This tool measures how well you are filling your lungs with each breath. Taking long deep breaths may help reverse or decrease the chance of developing breathing (pulmonary) problems (especially infection) following:  A long period of time when you are unable to move or be active. BEFORE THE PROCEDURE   If the spirometer includes an indicator to show your best effort, your nurse or respiratory therapist will set it to a desired goal.  If possible, sit up straight or lean slightly forward. Try not to slouch.  Hold the incentive spirometer in an upright position. INSTRUCTIONS FOR USE   1. Sit on the edge of your bed if possible, or sit up as far as you can in bed or on a chair. 2. Hold the incentive spirometer in an upright position. 3. Breathe out normally. 4. Place the mouthpiece in your mouth and seal your lips tightly around it. 5. Breathe in slowly and as deeply as possible, raising the piston or the ball toward the top of the column. 6. Hold your breath for 3-5 seconds or for as long as possible. Allow the piston or ball to fall to the bottom of the column. 7. Remove the mouthpiece from your mouth and breathe out normally. 8. Rest for a few seconds and repeat Steps 1 through 7 at least 10 times every 1-2 hours when you are awake. Take your time and take a few normal breaths between deep breaths. 9. The spirometer may include an indicator to show your best effort. Use the indicator as a goal to work toward during each repetition. 10. After each set of 10 deep breaths, practice coughing to be sure your lungs are clear. If you have an incision (the cut made at the time of surgery), support your incision when coughing by placing a pillow or rolled up towels firmly against it. Once you are able to get out of bed, walk around indoors and cough well. You may stop using the incentive spirometer when instructed by your caregiver.  RISKS AND COMPLICATIONS  Take your time so you do not get dizzy or light-headed.  If you are in pain, you may need to take or ask for pain medication before doing incentive spirometry. It is harder to take a deep breath if you are having pain.  AFTER USE  Rest and breathe slowly and easily.  It can be helpful to keep track of a log of your progress. Your caregiver can provide you with a simple table to help with this. If you are using the spirometer at home, follow these instructions: SEEK MEDICAL CARE IF:   You are having difficultly using the spirometer.  You have trouble using the spirometer as often as instructed.  Your pain medication is  not giving enough relief while using the spirometer.  You develop fever of 100.5 F (38.1 C) or higher. SEEK IMMEDIATE MEDICAL CARE IF:   You cough up bloody sputum that had not been present before.  You develop fever of 102 F (38.9 C) or greater.  You develop worsening pain at or near the incision site. MAKE SURE YOU:   Understand these instructions.  Will watch your condition.  Will get help right away if you are not doing well or get worse. Document Released: 03/03/2007 Document Revised: 01/13/2012 Document Reviewed: 05/04/2007 ExitCare Patient Information 2014 ExitCare, [place].   ________________________________________________________________________  WHAT IS A BLOOD TRANSFUSION? Blood Transfusion Information  A transfusion is the replacement of blood or some of its parts. Blood is made up of multiple cells which provide different functions.  Red blood cells carry oxygen and are used for blood loss replacement.  White blood cells fight against infection.  Platelets control bleeding.  Plasma helps clot blood.  Other blood products are available for specialized needs, such as hemophilia or other clotting disorders. BEFORE THE TRANSFUSION  Who gives blood for transfusions?   Healthy volunteers who are fully evaluated to make sure their blood is safe. This is blood bank blood. Transfusion therapy is the safest it has ever been in the practice of medicine. Before blood is taken from a donor, a complete history is taken to make sure that person has no history of diseases nor engages in risky social behavior (examples are intravenous drug use or sexual activity with multiple partners). The donor's travel history is screened to minimize risk of transmitting infections, such as malaria. The donated blood is tested for signs of infectious diseases, such as HIV and hepatitis. The blood is then tested to be sure it is compatible with you in order to minimize the chance of a  transfusion reaction. If you or a relative donates blood, this is often done in anticipation of surgery and is not appropriate for emergency situations. It takes many days to process the donated blood. RISKS AND COMPLICATIONS Although transfusion therapy is very safe and saves many lives, the main dangers of transfusion include:   Getting an infectious disease.  Developing a transfusion reaction. This is an allergic reaction to something in the blood you were given. Every precaution is taken to prevent this. The decision to have a blood transfusion has been considered carefully by your caregiver before blood is given. Blood is not given unless the benefits outweigh the risks. AFTER THE TRANSFUSION  Right after receiving a blood transfusion, you will usually feel much better and more energetic. This is especially true if your red blood cells have gotten low (anemic). The transfusion raises the level of the red blood cells which carry oxygen, and this usually causes an energy increase.  The nurse administering the transfusion will monitor you carefully for complications. HOME CARE INSTRUCTIONS  No special instructions are needed after a transfusion. You may find your energy is better. Speak with your caregiver about any limitations on activity for underlying diseases  you may have. SEEK MEDICAL CARE IF:   Your condition is not improving after your transfusion.  You develop redness or irritation at the intravenous (IV) site. SEEK IMMEDIATE MEDICAL CARE IF:  Any of the following symptoms occur over the next 12 hours:  Shaking chills.  You have a temperature by mouth above 102 F (38.9 C), not controlled by medicine.  Chest, back, or muscle pain.  People around you feel you are not acting correctly or are confused.  Shortness of breath or difficulty breathing.  Dizziness and fainting.  You get a rash or develop hives.  You have a decrease in urine output.  Your urine turns a dark  color or changes to pink, red, or brown. Any of the following symptoms occur over the next 10 days:  You have a temperature by mouth above 102 F (38.9 C), not controlled by medicine.  Shortness of breath.  Weakness after normal activity.  The white part of the eye turns yellow (jaundice).  You have a decrease in the amount of urine or are urinating less often.  Your urine turns a dark color or changes to pink, red, or brown. Document Released: 10/18/2000 Document Revised: 01/13/2012 Document Reviewed: 06/06/2008 [facility] HealthcareExit[facility] Patient Information 2014 [place], [place].  _______________________________________________________________________

## 2019-07-20 ENCOUNTER — Other Ambulatory Visit: Payer: Self-pay | Admitting: Internal Medicine

## 2019-07-20 MED ORDER — LINAGLIPTIN 5 MG PO TABS: 5.0000 mg | ORAL_TABLET | Freq: Every day | ORAL | 3 refills | Status: DC

## 2019-07-20 NOTE — Telephone Encounter (Signed)
Medication Refill - Medication: linagliptin (TRADJENTA) 5 MG TABS tablet   Preferred Pharmacy (with phone number or street name):  [id number] - [place], [place] - [address] [phone] (Phone) [phone] (Fax)     Patient needs medication for upcoming surgery as soon as possible.

## 2019-07-20 NOTE — Telephone Encounter (Signed)
Onglyza and Januvia are preferred. Please advise.

## 2019-07-21 MED ORDER — SITAGLIPTIN PHOSPHATE 100 MG PO TABS: 100.0000 mg | ORAL_TABLET | Freq: Every day | ORAL | 3 refills | Status: DC

## 2019-07-21 NOTE — Addendum Note (Signed)
Addended by: Cassandria Anger on: 07/21/2019 11:30 PM   Modules accepted: Orders

## 2019-07-21 NOTE — Telephone Encounter (Signed)
Okay Januvia 100 mg daily.  Thanks

## 2019-07-22 ENCOUNTER — Encounter (HOSPITAL_COMMUNITY): Payer: Self-pay

## 2019-07-22 ENCOUNTER — Encounter (HOSPITAL_COMMUNITY)
Admission: RE | Admit: 2019-07-22 | Discharge: 2019-07-22 | Disposition: A | Discharge status: Home / Self Care | Payer: BC Managed Care – PPO | Source: Ambulatory Visit | Attending: Orthopedic Surgery | Admitting: Orthopedic Surgery

## 2019-07-22 ENCOUNTER — Other Ambulatory Visit: Payer: Self-pay

## 2019-07-22 DIAGNOSIS — Z87442 Personal history of urinary calculi: Secondary | ICD-10-CM | POA: Diagnosis not present

## 2019-07-22 DIAGNOSIS — G4730 Sleep apnea, unspecified: Secondary | ICD-10-CM | POA: Diagnosis not present

## 2019-07-22 DIAGNOSIS — Z79899 Other long term (current) drug therapy: Secondary | ICD-10-CM | POA: Insufficient documentation

## 2019-07-22 DIAGNOSIS — K219 Gastro-esophageal reflux disease without esophagitis: Secondary | ICD-10-CM | POA: Diagnosis not present

## 2019-07-22 DIAGNOSIS — Z7984 Long term (current) use of oral hypoglycemic drugs: Secondary | ICD-10-CM | POA: Diagnosis not present

## 2019-07-22 DIAGNOSIS — M1611 Unilateral primary osteoarthritis, right hip: Secondary | ICD-10-CM | POA: Diagnosis not present

## 2019-07-22 DIAGNOSIS — E785 Hyperlipidemia, unspecified: Secondary | ICD-10-CM | POA: Diagnosis not present

## 2019-07-22 DIAGNOSIS — Z7982 Long term (current) use of aspirin: Secondary | ICD-10-CM | POA: Insufficient documentation

## 2019-07-22 DIAGNOSIS — I2510 Atherosclerotic heart disease of native coronary artery without angina pectoris: Secondary | ICD-10-CM | POA: Diagnosis not present

## 2019-07-22 DIAGNOSIS — I10 Essential (primary) hypertension: Secondary | ICD-10-CM | POA: Diagnosis not present

## 2019-07-22 DIAGNOSIS — D696 Thrombocytopenia, unspecified: Secondary | ICD-10-CM | POA: Diagnosis not present

## 2019-07-22 DIAGNOSIS — E119 Type 2 diabetes mellitus without complications: Secondary | ICD-10-CM | POA: Insufficient documentation

## 2019-07-22 DIAGNOSIS — M1990 Unspecified osteoarthritis, unspecified site: Secondary | ICD-10-CM | POA: Insufficient documentation

## 2019-07-22 DIAGNOSIS — Z01818 Encounter for other preprocedural examination: Secondary | ICD-10-CM | POA: Insufficient documentation

## 2019-07-22 HISTORY — DX: Other specified postprocedural states: Z98.890

## 2019-07-22 HISTORY — DX: Other complications of anesthesia, initial encounter: T88.59XA

## 2019-07-22 HISTORY — DX: Other specified postprocedural states: R11.2

## 2019-07-22 HISTORY — DX: Sleep apnea, unspecified: G47.30

## 2019-07-22 HISTORY — DX: Atherosclerotic heart disease of native coronary artery without angina pectoris: I25.10

## 2019-07-22 LAB — CBC
HCT: 47.8 % (ref 39.0–52.0)
Hemoglobin: 15.9 g/dL (ref 13.0–17.0)
MCH: 32.1 pg (ref 26.0–34.0)
MCHC: 33.3 g/dL (ref 30.0–36.0)
MCV: 96.4 fL (ref 80.0–100.0)
Platelets: 141 10*3/uL — ABNORMAL LOW (ref 150–400)
RBC: 4.96 MIL/uL (ref 4.22–5.81)
RDW: 13.7 % (ref 11.5–15.5)
WBC: 6 10*3/uL (ref 4.0–10.5)
nRBC: 0 % (ref 0.0–0.2)

## 2019-07-22 LAB — GLUCOSE, CAPILLARY: Glucose-Capillary: 145 mg/dL — ABNORMAL HIGH (ref 70–99)

## 2019-07-22 LAB — COMPREHENSIVE METABOLIC PANEL
ALT: 23 U/L (ref 0–44)
AST: 22 U/L (ref 15–41)
Albumin: 4.4 g/dL (ref 3.5–5.0)
Alkaline Phosphatase: 89 U/L (ref 38–126)
Anion gap: 8 (ref 5–15)
BUN: 15 mg/dL (ref 8–23)
CO2: 28 mmol/L (ref 22–32)
Calcium: 9.5 mg/dL (ref 8.9–10.3)
Chloride: 105 mmol/L (ref 98–111)
Creatinine, Ser: 1.24 mg/dL (ref 0.61–1.24)
GFR calc Af Amer: >60 mL/min (ref >60)
GFR calc non Af Amer: >60 mL/min (ref >60)
Glucose, Bld: 97 mg/dL (ref 70–99)
Potassium: 4.2 mmol/L (ref 3.5–5.1)
Sodium: 141 mmol/L (ref 135–145)
Total Bilirubin: 1.7 mg/dL — ABNORMAL HIGH (ref 0.3–1.2)
Total Protein: 7.3 g/dL (ref 6.5–8.1)

## 2019-07-22 LAB — ABO/RH: ABO/RH(D): A POS

## 2019-07-22 LAB — SURGICAL PCR SCREEN
MRSA, PCR: NEGATIVE
Staphylococcus aureus: NEGATIVE

## 2019-07-22 LAB — APTT: aPTT: 27 seconds (ref 24–36)

## 2019-07-22 LAB — PROTIME-INR
INR: 1.2 (ref 0.8–1.2)
Prothrombin Time: 14.6 seconds (ref 11.4–15.2)

## 2019-07-22 NOTE — Telephone Encounter (Signed)
Pt informed of below.  

## 2019-07-23 NOTE — H&P (Signed)
TOTAL HIP ADMISSION H&P  Patient is admitted for right total hip arthroplasty.  Subjective:  Chief Complaint: right hip pain  HPI: Jacob Gordon, 63 y.o. male, has a history of pain and functional disability in the right hip(s) due to arthritis and patient has failed non-surgical conservative treatments for greater than 12 weeks to include NSAID's and/or analgesics, corticosteriod injections, flexibility and strengthening excercises and activity modification.  Onset of symptoms was gradual starting 4 years ago with gradually worsening course since that time.The patient noted no past surgery on the right hip(s).  Patient currently rates pain in the right hip at 7 out of 10 with activity. Patient has night pain, worsening of pain with activity and weight bearing, pain that interfers with activities of daily living and pain with passive range of motion. Patient has evidence of periarticular osteophytes and joint space narrowing by imaging studies. This condition presents safety issues increasing the risk of falls.  There is no current active infection.  Patient Active Problem List   Diagnosis Date Noted  . Preop exam for internal medicine 06/10/2019  . Piriformis syndrome, right 03/11/2019  . Gluteal tendinitis of right buttock 02/04/2019  . Arthritis of right hip 02/04/2019  . Hemorrhoids 01/15/2019  . Discoloration of skin of toe 08/13/2018  . Left foot pain 08/13/2018  . Right hip pain 08/13/2018  . Abnormal cardiac CT angiography   . Coronary artery disease involving native coronary artery of native heart without angina pectoris   . Angina pectoris ([place]) 01/02/2018  . DOE (dyspnea on exertion) 11/26/2017  . Chest pressure 11/26/2017  . Ingrowing hair 03/17/2017  . Abdominal tenderness of left lower quadrant 06/08/2015  . Bladder neck obstruction 12/01/2012  . Night sweats 09/30/2012  . Headache disorder 07/29/2012  . Stress at work 07/29/2012  . Insomnia 07/29/2012  . Loose  stools 12/13/2011  . Dyspepsia 12/13/2011  . Dysphagia 11/13/2011  . Nausea 11/13/2011  . Well adult exam 07/10/2011  . BPH (benign prostatic hyperplasia) 07/10/2011  . THROMBOCYTOPENIA 02/06/2010  . Myalgia 01/31/2010  . Pain in joint 11/29/2008  . Diabetes mellitus type 2, controlled ([place]) 05/31/2008  . GERD (gastroesophageal reflux disease) 05/31/2008  . Arthralgia 05/31/2008  . HLD (hyperlipidemia) 03/02/2008  . Diabetes mellitus with coincident hypertension ([place]) 03/02/2008  . ALLERGIC RHINITIS 03/02/2008  . ARTHRITIS, SHOULDER 03/02/2008  . [place] DISEASE 03/02/2008   Past Medical History:  Diagnosis Date  . ALLERGIC RHINITIS   . Chronic headaches    migraines  . Complication of anesthesia    violent vomiting   . Coronary artery disease    non obstructive  . DEGENERATIVE DISC DISEASE   . DIABETES MELLITUS, TYPE II    type 2  . GERD   . History of kidney stones   . HLD (hyperlipidemia)   . HYPERTENSION   . Internal hemorrhoids   . OSTEOARTHRITIS   . PONV (postoperative nausea and vomiting)   . Sleep apnea    no cpap mild case  . THROMBOCYTOPENIA     Past Surgical History:  Procedure Laterality Date  . COLONOSCOPY  10/01/11   internal hemorrhoids  . LEFT HEART CATH AND CORONARY ANGIOGRAPHY N/A 01/07/2018   Procedure: LEFT HEART CATH AND CORONARY ANGIOGRAPHY;  Surgeon: Burnell Blanks, MD;  Location: [facility];  Service: Cardiovascular;  Laterality: N/A;  . SHOULDER SURGERY     right rotator cuff surg  . TONSILLECTOMY AND ADENOIDECTOMY    . VASECTOMY  Current Outpatient Medications  Medication Sig Dispense Refill Last Dose  . amLODipine (NORVASC) 10 MG tablet TAKE 1 TABLET BY MOUTH EVERY DAY. NEEDS OFFICE VISIT FOR FURTHER REFILLS. (Patient taking differently: Take 10 mg by mouth daily. ) 90 tablet 2   . aspirin EC 81 MG tablet Take 162 mg by mouth daily.     Marland Kitchen. atorvastatin (LIPITOR) 80 MG tablet TAKE 1 TABLET(80 MG) BY  MOUTH DAILY (Patient taking differently: Take 80 mg by mouth daily. ) 90 tablet 3   . carvedilol (COREG) 25 MG tablet Take 1 tablet (25 mg total) by mouth 2 (two) times daily with a meal. 180 tablet 0   . Cholecalciferol (VITAMIN D-3) 125 MCG (5000 UT) TABS Take 5,000 Units by mouth daily.     . colesevelam (WELCHOL) 625 MG tablet TAKE 3 TABLETS BY MOUTH  TWICE A DAY WITH A MEAL (Patient taking differently: Take 1,875 mg by mouth 2 (two) times daily with a meal. ) 180 tablet 3   . Cyanocobalamin (VITAMIN B-12 PO) Take 1 tablet by mouth daily.     Marland Kitchen. gabapentin (NEURONTIN) 100 MG capsule Take 2 capsules (200 mg total) by mouth at bedtime. 60 capsule 3   . linagliptin (TRADJENTA) 5 MG TABS tablet Take 1 tablet (5 mg total) by mouth daily. 30 tablet 3   . meloxicam (MOBIC) 15 MG tablet TAKE 1 TABLET(15 MG) BY MOUTH DAILY (Patient taking differently: Take 15 mg by mouth daily. ) 30 tablet 2   . OVER THE COUNTER MEDICATION Take 1 capsule by mouth daily. Suprema Dophilus Supplement - probiotic     . pantoprazole (PROTONIX) 40 MG tablet TAKE 1 TABLET(40 MG) BY MOUTH DAILY (Patient taking differently: Take 40 mg by mouth daily. ) 90 tablet 3   . tadalafil (CIALIS) 5 MG tablet TAKE 1 TABLET(5 MG) BY MOUTH DAILY (Patient taking differently: Take 5 mg by mouth daily. ) 30 tablet 11   . terazosin (HYTRIN) 10 MG capsule Take 10 mg by mouth at bedtime.     . sitaGLIPtin (JANUVIA) 100 MG tablet Take 1 tablet (100 mg total) by mouth daily. (Patient taking differently: Take 100 mg by mouth daily. ) 90 tablet 3    Allergies  Allergen Reactions  . Tape Other (See Comments)    ADHESIVE - blistered   . Sulfa Antibiotics Itching  . Valsartan Other (See Comments)    Not sure - possibly caused stomach upset.  . Pravastatin Sodium Nausea Only    Social History   Tobacco Use  . Smoking status: Never Smoker  . Smokeless tobacco: Never Used  Substance Use Topics  . Alcohol use: No    Family History  Problem  Relation Age of Onset  . Diabetes Sister   . Colon cancer Neg Hx      Review of Systems  Constitutional: Negative.   HENT: Negative.   Eyes: Negative.   Respiratory: Negative.   Cardiovascular: Negative.   Gastrointestinal: Negative.   Genitourinary: Negative.   Musculoskeletal: Positive for joint pain and myalgias. Negative for back pain, falls and neck pain.  Skin: Negative.   Neurological: Positive for headaches. Negative for dizziness, tingling, tremors, sensory change, speech change, focal weakness, seizures, loss of consciousness and weakness.  Endo/Heme/Allergies: Positive for environmental allergies. Negative for polydipsia. Does not bruise/bleed easily.  Psychiatric/Behavioral: Negative.     Objective:  Physical Exam  Constitutional: He is oriented to person, place, and time. He appears well-developed. No distress.  Overweight  HENT:  Head: Normocephalic and atraumatic.  Right Ear: External ear normal.  Left Ear: External ear normal.  Nose: Nose normal.  Mouth/Throat: Oropharynx is clear and moist.  Eyes: Conjunctivae and EOM are normal.  Neck: Normal range of motion. Neck supple.  Cardiovascular: Normal rate, regular rhythm, normal heart sounds and intact distal pulses.  No murmur heard. Respiratory: Effort normal and breath sounds normal. No respiratory distress. He has no wheezes.  GI: Soft. Bowel sounds are normal. He exhibits no distension. There is no abdominal tenderness.  Musculoskeletal:     Comments: Left Hip Exam: The range of motion: normal without discomfort. There is no tenderness over the greater trochanter bursa. There is no pain on provocative testing of the hip.  Right Hip Exam: The range of motion: Flexion to 90 degrees, Internal Rotation to 0 degrees, External Rotation to 10 degrees, and abduction to 20 degrees without discomfort. There is some tenderness over the greater trochanter bursa.  Neurological: He is alert and oriented to person,  place, and time. He has normal strength. No sensory deficit.  Skin: No rash noted. He is not diaphoretic. No erythema.  Psychiatric: He has a normal mood and affect. His behavior is normal.    Vitals Ht: 5 ft 9 in  Wt: 202 lbs  BMI: 29.8  BP: 120/74 sitting R arm  HR:      76 bpm  Imaging Review Plain radiographs demonstrate severe degenerative joint disease of the right hip(s). The bone quality appears to be good for age and reported activity level.    Assessment/Plan:  End stage primary osteoarthritis, right hip(s)  The patient history, physical examination, clinical judgement of the provider and imaging studies are consistent with end stage degenerative joint disease of the right hip(s) and total hip arthroplasty is deemed medically necessary. The treatment options including medical management, injection therapy, arthroscopy and arthroplasty were discussed at length. The risks and benefits of total hip arthroplasty were presented and reviewed. The risks due to aseptic loosening, infection, stiffness, dislocation/subluxation,  thromboembolic complications and other imponderables were discussed.  The patient acknowledged the explanation, agreed to proceed with the plan and consent was signed. Patient is being admitted for inpatient treatment for surgery, pain control, PT, OT, prophylactic antibiotics, VTE prophylaxis, progressive ambulation and ADL's and discharge planning.The patient is planning to be discharged home with HEP.   Risks and benefits of the surgery were discussed with the patient and Dr.Gioffre at their previous office visit, and the patient has elected to move forward with the aforementioned surgery. Post-operative care plans were discussed with the patient today.  Therapy Plans: HEP Disposition: Home with wife Planned DVT prophylaxis: aspirin 325mg  BID DME needed: rolling walker; does not need bedside commode PCP: [name] Cardio: Dr. Anne Fu Last A1C:  6.8 Other: severe N/V with anesthesia Severe reaction to adhesives- no paper tape either  Dimitri Ped, PAC

## 2019-07-24 ENCOUNTER — Other Ambulatory Visit (HOSPITAL_COMMUNITY)
Admission: RE | Admit: 2019-07-24 | Discharge: 2019-07-24 | Disposition: A | Discharge status: Home / Self Care | Payer: BC Managed Care – PPO | Source: Ambulatory Visit | Attending: Orthopedic Surgery | Admitting: Orthopedic Surgery

## 2019-07-24 DIAGNOSIS — Z01812 Encounter for preprocedural laboratory examination: Secondary | ICD-10-CM | POA: Insufficient documentation

## 2019-07-24 DIAGNOSIS — Z20828 Contact with and (suspected) exposure to other viral communicable diseases: Secondary | ICD-10-CM | POA: Diagnosis not present

## 2019-07-25 LAB — NOVEL CORONAVIRUS, NAA (HOSP ORDER, SEND-OUT TO REF LAB; TAT 18-24 HRS): SARS-CoV-2, NAA: NOT DETECTED

## 2019-07-26 NOTE — Progress Notes (Signed)
Anesthesia Chart Review   Case: [id number] Date/Time: 07/28/19 1215   Procedure: TOTAL HIP ARTHROPLASTY ANTERIOR APPROACH (Right ) - 192mins   Anesthesia type: Spinal   Pre-op diagnosis: Right hip osteoarthritis   Location: [facility] ROOM 09 / [facility]   Surgeon: Gaynelle Arabian, MD      DISCUSSION:62 y.o. never smoker with h/o PONV, HTN, GERD, non obstructive CAD on cath 01/2018, sleep apnea, chronic thrombocytopenia (Platelets 141 07/22/2019), right hip OA scheduled for above procedure 07/28/2019 with Dr. Gaynelle Arabian.   Pt cleared by PCP, Dr. Lew Dawes, 06/10/2019.  Per OV note, "The patient should be clear medically for his right hip surgery.  He would be in the low risk category for perioperative complications.  He has chronic, mild thrombocytopenia.  His pro time/INR was slightly elevated-likely due to aspirin use.  You can instruct him to discontinue aspirin prior to surgery per your regular protocol.  He is EKG is unchanged from previous.  There is no angina symptoms."  Anticipate pt can proceed with planned procedure barring acute status change.   VS: BP 127/90   Pulse 68   Temp 36.8 C (Oral)   Resp 18   Ht 5\' 9"  (1.753 m)   Wt 91.6 kg   SpO2 100%   BMI 29.83 kg/m   PROVIDERS: Plotnikov, Evie Lacks, MD is PCP    LABS: Labs reviewed: Acceptable for surgery. (all labs ordered are listed, but only abnormal results are displayed)  Labs Reviewed  CBC - Abnormal; Notable for the following components:      Result Value   Platelets 141 (*)    All other components within normal limits  COMPREHENSIVE METABOLIC PANEL - Abnormal; Notable for the following components:   Total Bilirubin 1.7 (*)    All other components within normal limits  GLUCOSE, CAPILLARY - Abnormal; Notable for the following components:   Glucose-Capillary 145 (*)    All other components within normal limits  SURGICAL PCR SCREEN  APTT  PROTIME-INR  TYPE AND SCREEN  ABO/RH      IMAGES:   EKG: 06/10/2019 Rate 69 bpm Sinus rhythm  Low voltage in limb leads Old inferior infarct   CV: Cardiac Cath 01/07/2018  Prox RCA lesion is 40% stenosed.  Mid RCA lesion is 40% stenosed.  Dist RCA lesion is 40% stenosed.  Ost RPDA to RPDA lesion is 30% stenosed.  Prox Cx to Mid Cx lesion is 30% stenosed.  Ost 2nd Mrg to 2nd Mrg lesion is 30% stenosed.  3rd Mrg lesion is 30% stenosed.  Ost 3rd Diag lesion is 50% stenosed.  Ost 1st Diag lesion is 99% stenosed.  Dist LM to Prox LAD lesion is 20% stenosed.  Mid LAD lesion is 30% stenosed.  Dist LAD lesion is 60% stenosed.  The left ventricular systolic function is normal.  LV end diastolic pressure is normal.  The left ventricular ejection fraction is 55-65% by visual estimate.  There is no mitral valve regurgitation.   1. Moderate non-obstructive CAD 2. There are no flow limiting lesions seen in the RCA, Circumflex or LAD 3. There is a small caliber branch that arises as a septal perforator and then courses to the lateral wall in the distribution of a diagonal branch. There is a severe stenosis in this branch. The branch is too small for PCI/stenting.  4. Normal LV systolic function  Recommendations: I would continue medical management of CAD with aggressive risk factor modification. The small diagonal branch could certainly cause angina.  I do not think I would approach this branch with PCI at this time given the size of the vessel.   11/28/2017 Study Conclusions  - Left ventricle: The cavity size was normal. Wall thickness was   normal. Systolic function was normal. The estimated ejection   fraction was in the range of 55% to 60%. Past Medical History:  Diagnosis Date  . ALLERGIC RHINITIS   . Chronic headaches    migraines  . Complication of anesthesia    violent vomiting   . Coronary artery disease    non obstructive  . DEGENERATIVE DISC DISEASE   . DIABETES MELLITUS, TYPE II    type  2  . GERD   . History of kidney stones   . HLD (hyperlipidemia)   . HYPERTENSION   . Internal hemorrhoids   . OSTEOARTHRITIS   . PONV (postoperative nausea and vomiting)   . Sleep apnea    no cpap mild case  . THROMBOCYTOPENIA     Past Surgical History:  Procedure Laterality Date  . COLONOSCOPY  10/01/11   internal hemorrhoids  . LEFT HEART CATH AND CORONARY ANGIOGRAPHY N/A 01/07/2018   Procedure: LEFT HEART CATH AND CORONARY ANGIOGRAPHY;  Surgeon: Kathleene Hazel, MD;  Location: [facility];  Service: Cardiovascular;  Laterality: N/A;  . SHOULDER SURGERY     right rotator cuff surg  . TONSILLECTOMY AND ADENOIDECTOMY    . VASECTOMY      MEDICATIONS: . amLODipine (NORVASC) 10 MG tablet  . aspirin EC 81 MG tablet  . atorvastatin (LIPITOR) 80 MG tablet  . carvedilol (COREG) 25 MG tablet  . Cholecalciferol (VITAMIN D-3) 125 MCG (5000 UT) TABS  . colesevelam (WELCHOL) 625 MG tablet  . Cyanocobalamin (VITAMIN B-12 PO)  . gabapentin (NEURONTIN) 100 MG capsule  . linagliptin (TRADJENTA) 5 MG TABS tablet  . meloxicam (MOBIC) 15 MG tablet  . OVER THE COUNTER MEDICATION  . pantoprazole (PROTONIX) 40 MG tablet  . sitaGLIPtin (JANUVIA) 100 MG tablet  . tadalafil (CIALIS) 5 MG tablet  . terazosin (HYTRIN) 10 MG capsule   No current facility-administered medications for this encounter.     Janey Genta [facility] Pre-Surgical Testing [phone] 07/26/19 11:31 AM

## 2019-07-26 NOTE — Anesthesia Preprocedure Evaluation (Addendum)
Anesthesia Evaluation  Patient identified by MRN, date of birth, ID band Patient awake    Reviewed: Allergy & Precautions, NPO status , Patient's Chart, lab work & pertinent test results, reviewed documented beta blocker date and time   History of Anesthesia Complications (+) PONV and history of anesthetic complications  Airway Mallampati: II  TM Distance: >3 FB Neck ROM: Full    Dental  (+) Teeth Intact, Dental Advisory Given   Pulmonary sleep apnea ,    Pulmonary exam normal breath sounds clear to auscultation       Cardiovascular hypertension, Pt. on home beta blockers + angina + CAD  Normal cardiovascular exam Rhythm:Regular Rate:Normal     Neuro/Psych  Headaches,  Neuromuscular disease    GI/Hepatic Neg liver ROS, GERD  ,  Endo/Other  diabetes, Type 2, Oral Hypoglycemic Agents  Renal/GU negative Renal ROS     Musculoskeletal  (+) Arthritis , Osteoarthritis,  Right hip osteoarthritis   Abdominal   Peds  Hematology negative hematology ROS (+)   Anesthesia Other Findings Day of surgery medications reviewed with the patient.  Reproductive/Obstetrics                            Anesthesia Physical Anesthesia Plan  ASA: II  Anesthesia Plan: Spinal   Post-op Pain Management:    Induction:   PONV Risk Score and Plan: 2 and Propofol infusion, Treatment may vary due to age or medical condition, Scopolamine patch - Pre-op, Diphenhydramine, Dexamethasone, Ondansetron and Midazolam  Airway Management Planned: Natural Airway and Nasal Cannula  Additional Equipment:   Intra-op Plan:   Post-operative Plan:   Informed Consent: I have reviewed the patients History and Physical, chart, labs and discussed the procedure including the risks, benefits and alternatives for the proposed anesthesia with the patient or authorized representative who has indicated his/her understanding and acceptance.      Dental advisory given  Plan Discussed with: CRNA, Anesthesiologist and Surgeon  Anesthesia Plan Comments: (See PAT note 07/22/2019, Konrad Felix, PA-C)       Anesthesia Quick Evaluation

## 2019-07-28 ENCOUNTER — Inpatient Hospital Stay (HOSPITAL_COMMUNITY): Payer: BC Managed Care – PPO | Admitting: Physician Assistant

## 2019-07-28 ENCOUNTER — Inpatient Hospital Stay (HOSPITAL_COMMUNITY): Payer: BC Managed Care – PPO | Admitting: Certified Registered"

## 2019-07-28 ENCOUNTER — Inpatient Hospital Stay (HOSPITAL_COMMUNITY): Payer: BC Managed Care – PPO

## 2019-07-28 ENCOUNTER — Inpatient Hospital Stay (HOSPITAL_COMMUNITY)
Admission: RE | Admit: 2019-07-28 | Discharge: 2019-07-29 | DRG: 470 | Disposition: A | Discharge status: Home / Self Care | Payer: BC Managed Care – PPO | Source: Ambulatory Visit | Attending: Orthopedic Surgery | Admitting: Orthopedic Surgery

## 2019-07-28 ENCOUNTER — Encounter (HOSPITAL_COMMUNITY): Payer: Self-pay | Admitting: Emergency Medicine

## 2019-07-28 ENCOUNTER — Encounter (HOSPITAL_COMMUNITY)
Admission: RE | Disposition: A | Discharge status: Home / Self Care | Payer: Self-pay | Source: Ambulatory Visit | Attending: Orthopedic Surgery

## 2019-07-28 ENCOUNTER — Other Ambulatory Visit: Payer: Self-pay

## 2019-07-28 DIAGNOSIS — K219 Gastro-esophageal reflux disease without esophagitis: Secondary | ICD-10-CM | POA: Diagnosis not present

## 2019-07-28 DIAGNOSIS — Z96641 Presence of right artificial hip joint: Secondary | ICD-10-CM | POA: Diagnosis not present

## 2019-07-28 DIAGNOSIS — G4700 Insomnia, unspecified: Secondary | ICD-10-CM | POA: Diagnosis present

## 2019-07-28 DIAGNOSIS — G5701 Lesion of sciatic nerve, right lower limb: Secondary | ICD-10-CM | POA: Diagnosis present

## 2019-07-28 DIAGNOSIS — Z79899 Other long term (current) drug therapy: Secondary | ICD-10-CM

## 2019-07-28 DIAGNOSIS — I10 Essential (primary) hypertension: Secondary | ICD-10-CM | POA: Diagnosis present

## 2019-07-28 DIAGNOSIS — M19019 Primary osteoarthritis, unspecified shoulder: Secondary | ICD-10-CM | POA: Diagnosis present

## 2019-07-28 DIAGNOSIS — N400 Enlarged prostate without lower urinary tract symptoms: Secondary | ICD-10-CM | POA: Diagnosis present

## 2019-07-28 DIAGNOSIS — I951 Orthostatic hypotension: Secondary | ICD-10-CM | POA: Diagnosis not present

## 2019-07-28 DIAGNOSIS — M1611 Unilateral primary osteoarthritis, right hip: Secondary | ICD-10-CM | POA: Diagnosis not present

## 2019-07-28 DIAGNOSIS — M25551 Pain in right hip: Secondary | ICD-10-CM | POA: Diagnosis present

## 2019-07-28 DIAGNOSIS — I2510 Atherosclerotic heart disease of native coronary artery without angina pectoris: Secondary | ICD-10-CM | POA: Diagnosis present

## 2019-07-28 DIAGNOSIS — Z7982 Long term (current) use of aspirin: Secondary | ICD-10-CM | POA: Diagnosis not present

## 2019-07-28 DIAGNOSIS — Z96649 Presence of unspecified artificial hip joint: Secondary | ICD-10-CM

## 2019-07-28 DIAGNOSIS — G4730 Sleep apnea, unspecified: Secondary | ICD-10-CM | POA: Diagnosis not present

## 2019-07-28 DIAGNOSIS — Z87442 Personal history of urinary calculi: Secondary | ICD-10-CM

## 2019-07-28 DIAGNOSIS — E119 Type 2 diabetes mellitus without complications: Secondary | ICD-10-CM | POA: Diagnosis not present

## 2019-07-28 DIAGNOSIS — E785 Hyperlipidemia, unspecified: Secondary | ICD-10-CM | POA: Diagnosis present

## 2019-07-28 DIAGNOSIS — M7601 Gluteal tendinitis, right hip: Secondary | ICD-10-CM | POA: Diagnosis present

## 2019-07-28 DIAGNOSIS — Z91048 Other nonmedicinal substance allergy status: Secondary | ICD-10-CM | POA: Diagnosis not present

## 2019-07-28 DIAGNOSIS — J309 Allergic rhinitis, unspecified: Secondary | ICD-10-CM | POA: Diagnosis present

## 2019-07-28 DIAGNOSIS — Z882 Allergy status to sulfonamides status: Secondary | ICD-10-CM

## 2019-07-28 DIAGNOSIS — Z9861 Coronary angioplasty status: Secondary | ICD-10-CM

## 2019-07-28 DIAGNOSIS — M169 Osteoarthritis of hip, unspecified: Secondary | ICD-10-CM

## 2019-07-28 DIAGNOSIS — Z471 Aftercare following joint replacement surgery: Secondary | ICD-10-CM | POA: Diagnosis not present

## 2019-07-28 DIAGNOSIS — Z419 Encounter for procedure for purposes other than remedying health state, unspecified: Secondary | ICD-10-CM

## 2019-07-28 DIAGNOSIS — Z888 Allergy status to other drugs, medicaments and biological substances status: Secondary | ICD-10-CM

## 2019-07-28 HISTORY — PX: TOTAL HIP ARTHROPLASTY: SHX124

## 2019-07-28 LAB — CBC
HCT: 45.3 % (ref 39.0–52.0)
Hemoglobin: 15.1 g/dL (ref 13.0–17.0)
MCH: 31.9 pg (ref 26.0–34.0)
MCHC: 33.3 g/dL (ref 30.0–36.0)
MCV: 95.8 fL (ref 80.0–100.0)
Platelets: 126 10*3/uL — ABNORMAL LOW (ref 150–400)
RBC: 4.73 MIL/uL (ref 4.22–5.81)
RDW: 13.7 % (ref 11.5–15.5)
WBC: 5.6 10*3/uL (ref 4.0–10.5)
nRBC: 0 % (ref 0.0–0.2)

## 2019-07-28 LAB — GLUCOSE, CAPILLARY
Glucose-Capillary: 120 mg/dL — ABNORMAL HIGH (ref 70–99)
Glucose-Capillary: 161 mg/dL — ABNORMAL HIGH (ref 70–99)
Glucose-Capillary: 141 mg/dL — ABNORMAL HIGH (ref 70–99)

## 2019-07-28 LAB — TYPE AND SCREEN
ABO/RH(D): A POS
Antibody Screen: NEGATIVE

## 2019-07-28 SURGERY — ARTHROPLASTY, HIP, TOTAL, ANTERIOR APPROACH
Anesthesia: Spinal | Site: Hip | Laterality: Right

## 2019-07-28 MED ORDER — MENTHOL 3 MG MT LOZG: 1.0000 | LOZENGE | OROMUCOSAL | Status: DC | PRN

## 2019-07-28 MED ORDER — PANTOPRAZOLE SODIUM 40 MG PO TBEC
40.0000 mg | DELAYED_RELEASE_TABLET | Freq: Every day | ORAL | Status: DC
  Administered 2019-07-29: 10:00:00 40 mg via ORAL
  Filled 2019-07-28: qty 1

## 2019-07-28 MED ORDER — PROPOFOL 10 MG/ML IV BOLUS
INTRAVENOUS | Status: AC
  Filled 2019-07-28: qty 20

## 2019-07-28 MED ORDER — DEXAMETHASONE SODIUM PHOSPHATE 10 MG/ML IJ SOLN
INTRAMUSCULAR | Status: AC
  Filled 2019-07-28: qty 1

## 2019-07-28 MED ORDER — SODIUM CHLORIDE 0.9 % IV SOLN
INTRAVENOUS | Status: DC
  Administered 2019-07-28 – 2019-07-29 (×2): via INTRAVENOUS

## 2019-07-28 MED ORDER — METHOCARBAMOL 500 MG IVPB - SIMPLE MED
500.0000 mg | Freq: Four times a day (QID) | INTRAVENOUS | Status: DC | PRN
  Administered 2019-07-28: 14:00:00 500 mg via INTRAVENOUS
  Filled 2019-07-28: qty 50

## 2019-07-28 MED ORDER — ACETAMINOPHEN 10 MG/ML IV SOLN
1000.0000 mg | Freq: Four times a day (QID) | INTRAVENOUS | Status: DC
  Administered 2019-07-28: 12:00:00 1000 mg via INTRAVENOUS

## 2019-07-28 MED ORDER — METOPROLOL TARTRATE 5 MG/5ML IV SOLN
INTRAVENOUS | Status: DC | PRN
  Administered 2019-07-28: 1 mg via INTRAVENOUS
  Administered 2019-07-28: 1.5 mg via INTRAVENOUS

## 2019-07-28 MED ORDER — ALBUMIN HUMAN 5 % IV SOLN
INTRAVENOUS | Status: AC
  Filled 2019-07-28: qty 250

## 2019-07-28 MED ORDER — GABAPENTIN 100 MG PO CAPS
200.0000 mg | ORAL_CAPSULE | Freq: Every day | ORAL | Status: DC
  Administered 2019-07-28: 21:00:00 200 mg via ORAL
  Filled 2019-07-28: qty 2

## 2019-07-28 MED ORDER — LACTATED RINGERS IV SOLN
INTRAVENOUS | Status: DC
  Administered 2019-07-28: 10:00:00 via INTRAVENOUS

## 2019-07-28 MED ORDER — PHENYLEPHRINE 40 MCG/ML (10ML) SYRINGE FOR IV PUSH (FOR BLOOD PRESSURE SUPPORT)
PREFILLED_SYRINGE | INTRAVENOUS | Status: DC | PRN
  Administered 2019-07-28 (×2): 40 ug via INTRAVENOUS
  Administered 2019-07-28 (×2): 120 ug via INTRAVENOUS

## 2019-07-28 MED ORDER — ONDANSETRON HCL 4 MG/2ML IJ SOLN
4.0000 mg | Freq: Once | INTRAMUSCULAR | Status: AC | PRN
  Administered 2019-07-28: 4 mg via INTRAVENOUS

## 2019-07-28 MED ORDER — HYDROCODONE-ACETAMINOPHEN 7.5-325 MG PO TABS
1.0000 | ORAL_TABLET | ORAL | Status: DC | PRN
  Administered 2019-07-29: 1 via ORAL
  Filled 2019-07-28: qty 1

## 2019-07-28 MED ORDER — PROPOFOL 10 MG/ML IV BOLUS
INTRAVENOUS | Status: DC | PRN
  Administered 2019-07-28 (×2): 20 mg via INTRAVENOUS

## 2019-07-28 MED ORDER — METHOCARBAMOL 500 MG IVPB - SIMPLE MED
INTRAVENOUS | Status: AC
  Filled 2019-07-28: qty 50

## 2019-07-28 MED ORDER — BUPIVACAINE IN DEXTROSE 0.75-8.25 % IT SOLN
INTRATHECAL | Status: DC | PRN
  Administered 2019-07-28: 1.8 mL via INTRATHECAL

## 2019-07-28 MED ORDER — ASPIRIN EC 325 MG PO TBEC
325.0000 mg | DELAYED_RELEASE_TABLET | Freq: Two times a day (BID) | ORAL | Status: DC
  Administered 2019-07-29: 325 mg via ORAL
  Filled 2019-07-28: qty 1

## 2019-07-28 MED ORDER — AMLODIPINE BESYLATE 10 MG PO TABS: 10.0000 mg | ORAL_TABLET | Freq: Every day | ORAL | Status: DC

## 2019-07-28 MED ORDER — PROPOFOL 500 MG/50ML IV EMUL
INTRAVENOUS | Status: DC | PRN
  Administered 2019-07-28: 125 ug/kg/min via INTRAVENOUS

## 2019-07-28 MED ORDER — ONDANSETRON HCL 4 MG/2ML IJ SOLN
INTRAMUSCULAR | Status: AC
  Filled 2019-07-28: qty 2

## 2019-07-28 MED ORDER — ACETAMINOPHEN 500 MG PO TABS
500.0000 mg | ORAL_TABLET | Freq: Four times a day (QID) | ORAL | Status: DC
  Administered 2019-07-29: 500 mg via ORAL
  Filled 2019-07-28: qty 1

## 2019-07-28 MED ORDER — CHLORHEXIDINE GLUCONATE 4 % EX LIQD: 60.0000 mL | Freq: Once | CUTANEOUS | Status: DC

## 2019-07-28 MED ORDER — TERAZOSIN HCL 5 MG PO CAPS
10.0000 mg | ORAL_CAPSULE | Freq: Every day | ORAL | Status: DC
  Administered 2019-07-28: 10 mg via ORAL
  Filled 2019-07-28 (×2): qty 2

## 2019-07-28 MED ORDER — METOCLOPRAMIDE HCL 5 MG PO TABS: 5.0000 mg | ORAL_TABLET | Freq: Three times a day (TID) | ORAL | Status: DC | PRN

## 2019-07-28 MED ORDER — DOCUSATE SODIUM 100 MG PO CAPS
100.0000 mg | ORAL_CAPSULE | Freq: Two times a day (BID) | ORAL | Status: DC
  Filled 2019-07-28 (×2): qty 1

## 2019-07-28 MED ORDER — BUPIVACAINE-EPINEPHRINE (PF) 0.25% -1:200000 IJ SOLN
INTRAMUSCULAR | Status: DC | PRN
  Administered 2019-07-28: 20 mL

## 2019-07-28 MED ORDER — CEFAZOLIN SODIUM-DEXTROSE 2-4 GM/100ML-% IV SOLN
2.0000 g | Freq: Four times a day (QID) | INTRAVENOUS | Status: AC
  Administered 2019-07-28 (×2): 2 g via INTRAVENOUS
  Filled 2019-07-28 (×2): qty 100

## 2019-07-28 MED ORDER — MIDAZOLAM HCL 2 MG/2ML IJ SOLN
INTRAMUSCULAR | Status: AC
  Filled 2019-07-28: qty 2

## 2019-07-28 MED ORDER — ALBUMIN HUMAN 5 % IV SOLN
INTRAVENOUS | Status: DC | PRN
  Administered 2019-07-28: 12:00:00 via INTRAVENOUS

## 2019-07-28 MED ORDER — CEFAZOLIN SODIUM-DEXTROSE 2-4 GM/100ML-% IV SOLN
2.0000 g | INTRAVENOUS | Status: AC
  Administered 2019-07-28: 2 g via INTRAVENOUS
  Filled 2019-07-28: qty 100

## 2019-07-28 MED ORDER — DIPHENHYDRAMINE HCL 12.5 MG/5ML PO ELIX: 12.5000 mg | ORAL_SOLUTION | ORAL | Status: DC | PRN

## 2019-07-28 MED ORDER — BUPIVACAINE-EPINEPHRINE 0.5% -1:200000 IJ SOLN
INTRAMUSCULAR | Status: AC
  Filled 2019-07-28: qty 1

## 2019-07-28 MED ORDER — HYDROCODONE-ACETAMINOPHEN 5-325 MG PO TABS
1.0000 | ORAL_TABLET | ORAL | Status: DC | PRN
  Administered 2019-07-28 – 2019-07-29 (×2): 2 via ORAL
  Filled 2019-07-28 (×2): qty 2

## 2019-07-28 MED ORDER — METOPROLOL TARTRATE 5 MG/5ML IV SOLN
INTRAVENOUS | Status: AC
  Filled 2019-07-28: qty 5

## 2019-07-28 MED ORDER — PHENYLEPHRINE 40 MCG/ML (10ML) SYRINGE FOR IV PUSH (FOR BLOOD PRESSURE SUPPORT)
PREFILLED_SYRINGE | INTRAVENOUS | Status: AC
  Filled 2019-07-28: qty 10

## 2019-07-28 MED ORDER — TADALAFIL 5 MG PO TABS
5.0000 mg | ORAL_TABLET | Freq: Every day | ORAL | Status: DC
  Administered 2019-07-29: 12:00:00 5 mg via ORAL
  Filled 2019-07-28: qty 1

## 2019-07-28 MED ORDER — COLESEVELAM HCL 625 MG PO TABS
1875.0000 mg | ORAL_TABLET | Freq: Two times a day (BID) | ORAL | Status: DC
  Administered 2019-07-29: 1875 mg via ORAL
  Filled 2019-07-28 (×3): qty 3

## 2019-07-28 MED ORDER — POVIDONE-IODINE 10 % EX SWAB
2.0000 "application " | Freq: Once | CUTANEOUS | Status: AC
  Administered 2019-07-28: 2 via TOPICAL

## 2019-07-28 MED ORDER — SCOPOLAMINE 1 MG/3DAYS TD PT72
1.0000 | MEDICATED_PATCH | Freq: Once | TRANSDERMAL | Status: DC
  Administered 2019-07-28: 10:00:00 1.5 mg via TRANSDERMAL
  Filled 2019-07-28: qty 1

## 2019-07-28 MED ORDER — METOCLOPRAMIDE HCL 5 MG/ML IJ SOLN
5.0000 mg | Freq: Three times a day (TID) | INTRAMUSCULAR | Status: DC | PRN
  Administered 2019-07-28: 10 mg via INTRAVENOUS
  Filled 2019-07-28: qty 2

## 2019-07-28 MED ORDER — POLYETHYLENE GLYCOL 3350 17 G PO PACK: 17.0000 g | PACK | Freq: Every day | ORAL | Status: DC | PRN

## 2019-07-28 MED ORDER — INSULIN ASPART 100 UNIT/ML ~~LOC~~ SOLN
0.0000 [IU] | Freq: Three times a day (TID) | SUBCUTANEOUS | Status: DC
  Administered 2019-07-28: 3 [IU] via SUBCUTANEOUS
  Administered 2019-07-29 (×2): 2 [IU] via SUBCUTANEOUS

## 2019-07-28 MED ORDER — FENTANYL CITRATE (PF) 100 MCG/2ML IJ SOLN
25.0000 ug | INTRAMUSCULAR | Status: DC | PRN
  Administered 2019-07-28 (×2): 50 ug via INTRAVENOUS

## 2019-07-28 MED ORDER — DEXAMETHASONE SODIUM PHOSPHATE 10 MG/ML IJ SOLN
10.0000 mg | Freq: Once | INTRAMUSCULAR | Status: AC
  Administered 2019-07-29: 10 mg via INTRAVENOUS
  Filled 2019-07-28: qty 1

## 2019-07-28 MED ORDER — PHENOL 1.4 % MT LIQD: 1.0000 | OROMUCOSAL | Status: DC | PRN

## 2019-07-28 MED ORDER — CARVEDILOL 25 MG PO TABS
25.0000 mg | ORAL_TABLET | Freq: Two times a day (BID) | ORAL | Status: DC
  Filled 2019-07-28: qty 1

## 2019-07-28 MED ORDER — PROPOFOL 10 MG/ML IV BOLUS
INTRAVENOUS | Status: AC
  Filled 2019-07-28: qty 80

## 2019-07-28 MED ORDER — ONDANSETRON HCL 4 MG/2ML IJ SOLN
INTRAMUSCULAR | Status: DC | PRN
  Administered 2019-07-28: 4 mg via INTRAVENOUS

## 2019-07-28 MED ORDER — MORPHINE SULFATE (PF) 2 MG/ML IV SOLN
0.5000 mg | INTRAVENOUS | Status: DC | PRN
  Administered 2019-07-28 (×2): 1 mg via INTRAVENOUS
  Filled 2019-07-28 (×2): qty 1

## 2019-07-28 MED ORDER — ONDANSETRON HCL 4 MG/2ML IJ SOLN: 4.0000 mg | Freq: Four times a day (QID) | INTRAMUSCULAR | Status: DC | PRN

## 2019-07-28 MED ORDER — FENTANYL CITRATE (PF) 100 MCG/2ML IJ SOLN
INTRAMUSCULAR | Status: AC
  Filled 2019-07-28: qty 2

## 2019-07-28 MED ORDER — DEXAMETHASONE SODIUM PHOSPHATE 10 MG/ML IJ SOLN
8.0000 mg | Freq: Once | INTRAMUSCULAR | Status: AC
  Administered 2019-07-28: 8 mg via INTRAVENOUS

## 2019-07-28 MED ORDER — METHOCARBAMOL 500 MG PO TABS
500.0000 mg | ORAL_TABLET | Freq: Four times a day (QID) | ORAL | Status: DC | PRN
  Administered 2019-07-29: 06:00:00 500 mg via ORAL
  Filled 2019-07-28: qty 1

## 2019-07-28 MED ORDER — PROMETHAZINE HCL 25 MG/ML IJ SOLN
12.5000 mg | Freq: Four times a day (QID) | INTRAMUSCULAR | Status: DC | PRN
  Administered 2019-07-28: 12.5 mg via INTRAVENOUS
  Filled 2019-07-28: qty 1

## 2019-07-28 MED ORDER — TRANEXAMIC ACID-NACL 1000-0.7 MG/100ML-% IV SOLN
1000.0000 mg | INTRAVENOUS | Status: AC
  Administered 2019-07-28: 11:00:00 1000 mg via INTRAVENOUS

## 2019-07-28 MED ORDER — SODIUM CHLORIDE 0.9 % IR SOLN
Status: DC | PRN
  Administered 2019-07-28: 1000 mL

## 2019-07-28 MED ORDER — STERILE WATER FOR IRRIGATION IR SOLN
Status: DC | PRN
  Administered 2019-07-28 (×2): 1000 mL

## 2019-07-28 MED ORDER — BISACODYL 10 MG RE SUPP: 10.0000 mg | Freq: Every day | RECTAL | Status: DC | PRN

## 2019-07-28 MED ORDER — FLEET ENEMA 7-19 GM/118ML RE ENEM: 1.0000 | ENEMA | Freq: Once | RECTAL | Status: DC | PRN

## 2019-07-28 MED ORDER — LINAGLIPTIN 5 MG PO TABS
5.0000 mg | ORAL_TABLET | Freq: Every day | ORAL | Status: DC
  Administered 2019-07-29: 10:00:00 5 mg via ORAL
  Filled 2019-07-28: qty 1

## 2019-07-28 MED ORDER — ONDANSETRON HCL 4 MG PO TABS: 4.0000 mg | ORAL_TABLET | Freq: Four times a day (QID) | ORAL | Status: DC | PRN

## 2019-07-28 MED ORDER — ATORVASTATIN CALCIUM 40 MG PO TABS
80.0000 mg | ORAL_TABLET | Freq: Every day | ORAL | Status: DC
  Administered 2019-07-29: 12:00:00 80 mg via ORAL
  Filled 2019-07-28: qty 2

## 2019-07-28 MED ORDER — MIDAZOLAM HCL 2 MG/2ML IJ SOLN
INTRAMUSCULAR | Status: DC | PRN
  Administered 2019-07-28: 2 mg via INTRAVENOUS

## 2019-07-28 SURGICAL SUPPLY — 54 items
ADH SKN CLS APL DERMABOND .7 (GAUZE/BANDAGES/DRESSINGS) ×1
BAG DECANTER FOR FLEXI CONT (MISCELLANEOUS) IMPLANT
BAG SPEC THK2 15X12 ZIP CLS (MISCELLANEOUS)
BAG ZIPLOCK 12X15 (MISCELLANEOUS) IMPLANT
BLADE SAG 18X100X1.27 (BLADE) ×3 IMPLANT
CLOSURE WOUND 1/2 X4 (GAUZE/BANDAGES/DRESSINGS)
COVER PERINEAL POST (MISCELLANEOUS) ×3 IMPLANT
COVER SURGICAL LIGHT HANDLE (MISCELLANEOUS) ×3 IMPLANT
COVER WAND RF STERILE (DRAPES) IMPLANT
CUP ACET PINNACLE SECTR 50MM (Hips) IMPLANT
DECANTER SPIKE VIAL GLASS SM (MISCELLANEOUS) ×3 IMPLANT
DERMABOND ADVANCED (GAUZE/BANDAGES/DRESSINGS) ×2
DERMABOND ADVANCED .7 DNX12 (GAUZE/BANDAGES/DRESSINGS) IMPLANT
DRAPE STERI IOBAN 125X83 (DRAPES) ×3 IMPLANT
DRAPE U-SHAPE 47X51 STRL (DRAPES) ×6 IMPLANT
DRSG ADAPTIC 3X8 NADH LF (GAUZE/BANDAGES/DRESSINGS) ×1 IMPLANT
DRSG MEPILEX BORDER 4X4 (GAUZE/BANDAGES/DRESSINGS) ×1 IMPLANT
DRSG MEPILEX BORDER 4X8 (GAUZE/BANDAGES/DRESSINGS) ×1 IMPLANT
DRSG TEGADERM 4X4.75 (GAUZE/BANDAGES/DRESSINGS) ×2 IMPLANT
DRSG TEGADERM 6X8 (GAUZE/BANDAGES/DRESSINGS) ×2 IMPLANT
DURAPREP 26ML APPLICATOR (WOUND CARE) ×3 IMPLANT
ELECT REM PT RETURN 15FT ADLT (MISCELLANEOUS) ×3 IMPLANT
EVACUATOR 1/8 PVC DRAIN (DRAIN) ×3 IMPLANT
GAUZE SPONGE 4X4 12PLY STRL (GAUZE/BANDAGES/DRESSINGS) ×2 IMPLANT
GAUZE XEROFORM 1X8 LF (GAUZE/BANDAGES/DRESSINGS) ×2 IMPLANT
GLOVE BIO SURGEON STRL SZ 6 (GLOVE) IMPLANT
GLOVE BIO SURGEON STRL SZ7 (GLOVE) IMPLANT
GLOVE BIO SURGEON STRL SZ8 (GLOVE) ×3 IMPLANT
GLOVE BIOGEL PI IND STRL 6.5 (GLOVE) IMPLANT
GLOVE BIOGEL PI IND STRL 7.0 (GLOVE) IMPLANT
GLOVE BIOGEL PI IND STRL 8 (GLOVE) ×1 IMPLANT
GLOVE BIOGEL PI INDICATOR 6.5 (GLOVE)
GLOVE BIOGEL PI INDICATOR 7.0 (GLOVE)
GLOVE BIOGEL PI INDICATOR 8 (GLOVE) ×2
GOWN STRL REUS W/TWL LRG LVL3 (GOWN DISPOSABLE) ×3 IMPLANT
GOWN STRL REUS W/TWL XL LVL3 (GOWN DISPOSABLE) IMPLANT
HEAD FEMORAL 32 CERAMIC (Hips) ×2 IMPLANT
HOLDER FOLEY CATH W/STRAP (MISCELLANEOUS) ×3 IMPLANT
KIT TURNOVER KIT A (KITS) IMPLANT
LINER MARATHON 32 50 (Hips) ×2 IMPLANT
MANIFOLD NEPTUNE II (INSTRUMENTS) ×3 IMPLANT
PACK ANTERIOR HIP CUSTOM (KITS) ×3 IMPLANT
PINNACLE SECTOR CUP 50MM (Hips) ×3 IMPLANT
STEM FEMORAL SZ5 HIGH ACTIS (Stem) ×2 IMPLANT
STRIP CLOSURE SKIN 1/2X4 (GAUZE/BANDAGES/DRESSINGS) ×1 IMPLANT
SUT ETHIBOND NAB CT1 #1 30IN (SUTURE) ×3 IMPLANT
SUT MNCRL AB 4-0 PS2 18 (SUTURE) ×3 IMPLANT
SUT STRATAFIX 0 PDS 27 VIOLET (SUTURE) ×3
SUT VIC AB 2-0 CT1 27 (SUTURE) ×6
SUT VIC AB 2-0 CT1 TAPERPNT 27 (SUTURE) ×2 IMPLANT
SUTURE STRATFX 0 PDS 27 VIOLET (SUTURE) ×1 IMPLANT
SYR 50ML LL SCALE MARK (SYRINGE) IMPLANT
TRAY FOLEY MTR SLVR 16FR STAT (SET/KITS/TRAYS/PACK) ×3 IMPLANT
YANKAUER SUCT BULB TIP 10FT TU (MISCELLANEOUS) ×3 IMPLANT

## 2019-07-28 NOTE — Anesthesia Procedure Notes (Signed)
Procedure Name: MAC Date/Time: 07/28/2019 11:03 AM Performed by: Niel Hummer, CRNA Pre-anesthesia Checklist: Patient identified, Emergency Drugs available, Suction available and Patient being monitored Patient Re-evaluated:Patient Re-evaluated prior to induction Oxygen Delivery Method: Simple face mask

## 2019-07-28 NOTE — Interval H&P Note (Signed)
History and Physical Interval Note:  07/28/2019 10:12 AM  Jacob Gordon  has presented today for surgery, with the diagnosis of Right hip osteoarthritis.  The various methods of treatment have been discussed with the patient and family. After consideration of risks, benefits and other options for treatment, the patient has consented to  Procedure(s) with comments: [place] (Right) - 160mins as a surgical intervention.  The patient's history has been reviewed, patient examined, no change in status, stable for surgery.  I have reviewed the patient's chart and labs.  Questions were answered to the patient's satisfaction.     Pilar Plate Cruise [place]ner

## 2019-07-28 NOTE — Op Note (Signed)
OPERATIVE REPORT- TOTAL HIP ARTHROPLASTY   PREOPERATIVE DIAGNOSIS: Osteoarthritis of the Right hip.   POSTOPERATIVE DIAGNOSIS: Osteoarthritis of the Right  hip.   PROCEDURE: Right total hip arthroplasty, anterior approach.   SURGEON: Gaynelle Arabian, MD   ASSISTANT: Griffith Citron, PA-C  ANESTHESIA:  Spinal  ESTIMATED BLOOD LOSS:-750 mL    DRAINS: Hemovac x1.   COMPLICATIONS: None   CONDITION: PACU - hemodynamically stable.   BRIEF CLINICAL NOTE: Jacob Gordon is a 63 y.o. male who has advanced end-  stage arthritis of their Right  hip with progressively worsening pain and  dysfunction.The patient has failed nonoperative management and presents for  total hip arthroplasty.   PROCEDURE IN DETAIL: After successful administration of spinal  anesthetic, the traction boots for the [facility] bed were placed on both  feet and the patient was placed onto the [facility] bed, boots placed into the leg  holders. The Right hip was then isolated from the perineum with plastic  drapes and prepped and draped in the usual sterile fashion. ASIS and  greater trochanter were marked and a oblique incision was made, starting  at about 1 cm lateral and 2 cm distal to the ASIS and coursing towards  the anterior cortex of the femur. The skin was cut with a 10 blade  through subcutaneous tissue to the level of the fascia overlying the  tensor fascia lata muscle. The fascia was then incised in line with the  incision at the junction of the anterior third and posterior 2/3rd. The  muscle was teased off the fascia and then the interval between the TFL  and the rectus was developed. The Hohmann retractor was then placed at  the top of the femoral neck over the capsule. The vessels overlying the  capsule were cauterized and the fat on top of the capsule was removed.  A Hohmann retractor was then placed anterior underneath the rectus  femoris to give exposure to the entire anterior capsule. A T-shaped   capsulotomy was performed. The edges were tagged and the femoral head  was identified.       Osteophytes are removed off the superior acetabulum.  The femoral neck was then cut in situ with an oscillating saw. Traction  was then applied to the left lower extremity utilizing the [facility]  traction. The femoral head was then removed. Retractors were placed  around the acetabulum and then circumferential removal of the labrum was  performed. Osteophytes were also removed. Reaming starts at 47 mm to  medialize and  Increased in 2 mm increments to 49 mm. We reamed in  approximately 40 degrees of abduction, 20 degrees anteversion. A 50 mm  pinnacle acetabular shell was then impacted in anatomic position under  fluoroscopic guidance with excellent purchase. We did not need to place  any additional dome screws. A 32 mm neutral + 4 marathon liner was then  placed into the acetabular shell.       The femoral lift was then placed along the lateral aspect of the femur  just distal to the vastus ridge. The leg was  externally rotated and capsule  was stripped off the inferior aspect of the femoral neck down to the  level of the lesser trochanter, this was done with electrocautery. The femur was lifted after this was performed. The  leg was then placed in an extended and adducted position essentially delivering the femur. We also removed the capsule superiorly and the piriformis from the piriformis  fossa to gain excellent exposure of the  proximal femur. Rongeur was used to remove some cancellous bone to get  into the lateral portion of the proximal femur for placement of the  initial starter reamer. The starter broaches was placed  the starter broach  and was shown to go down the center of the canal. Broaching  with the Actis system was then performed starting at size 0  coursing  Up to size 5. A size 5 had excellent torsional and rotational  and axial stability. The trial high offset neck was then placed   with a 32 + 1 trial head. The hip was then reduced. We confirmed that  the stem was in the canal both on AP and lateral x-rays. It also has excellent sizing. The hip was reduced with outstanding stability through full extension and full external rotation.. AP pelvis was taken and the leg lengths were measured and found to be equal. Hip was then dislocated again and the femoral head and neck removed. The  femoral broach was removed. Size 5 Actis stem with a high offset  neck was then impacted into the femur following native anteversion. Has  excellent purchase in the canal. Excellent torsional and rotational and  axial stability. It is confirmed to be in the canal on AP and lateral  fluoroscopic views. The 32 + 1 ceramic head was placed and the hip  reduced with outstanding stability. Again AP pelvis was taken and it  confirmed that the leg lengths were equal. The wound was then copiously  irrigated with saline solution and the capsule reattached and repaired  with Ethibond suture. 30 ml of .25% Bupivicaine was  injected into the capsule and into the edge of the tensor fascia lata as well as subcutaneous tissue. The fascia overlying the tensor fascia lata was then closed with a running #1 V-Loc. Subcu was closed with interrupted 2-0 Vicryl and subcuticular running 4-0 Monocryl. Incision was cleaned  and dried. Steri-Strips and a bulky sterile dressing applied. Hemovac  drain was hooked to suction and then the patient was awakened and transported to  recovery in stable condition.        Please note that a surgical assistant was a medical necessity for this procedure to perform it in a safe and expeditious manner. Assistant was necessary to provide appropriate retraction of vital neurovascular structures and to prevent femoral fracture and allow for anatomic placement of the prosthesis.  Gaynelle Arabian, M.D.

## 2019-07-28 NOTE — Anesthesia Postprocedure Evaluation (Signed)
Anesthesia Post Note  Patient: Jacob Gordon  Procedure(s) Performed: TOTAL HIP ARTHROPLASTY ANTERIOR APPROACH (Right Hip)     Patient location during evaluation: PACU Anesthesia Type: Spinal Level of consciousness: oriented, awake and alert and awake Pain management: pain level controlled Vital Signs Assessment: post-procedure vital signs reviewed and stable Respiratory status: spontaneous breathing, respiratory function stable and nonlabored ventilation Cardiovascular status: blood pressure returned to baseline and stable Postop Assessment: no headache, no backache, no apparent nausea or vomiting, spinal receding and patient able to bend at knees Anesthetic complications: no    Last Vitals:  Vitals:   07/28/19 1902 07/28/19 2035  BP: 139/83 (!) 123/93  Pulse: (!) 56 (!) 55  Resp: 16 16  Temp:  36.5 C  SpO2: 100% 100%    Last Pain:  Vitals:   07/28/19 2054  TempSrc:   PainSc: Asleep                 Catalina Gravel

## 2019-07-28 NOTE — Anesthesia Procedure Notes (Signed)
Spinal  Patient location during procedure: OR Start time: 07/28/2019 11:10 AM End time: 07/28/2019 11:13 AM Staffing Anesthesiologist: Catalina Gravel, MD Performed: anesthesiologist  Preanesthetic Checklist Completed: patient identified, surgical consent, pre-op evaluation, timeout performed, IV checked, risks and benefits discussed and monitors and equipment checked Spinal Block Patient position: sitting Prep: site prepped and draped and DuraPrep Patient monitoring: continuous pulse ox and blood pressure Approach: midline Location: L3-4 Injection technique: single-shot Needle Needle type: Pencan  Needle gauge: 24 G Additional Notes Functioning IV was confirmed and monitors were applied. Sterile prep and drape, including hand hygiene, mask and sterile gloves were used. The patient was positioned and the spine was prepped. The skin was anesthetized with lidocaine.  Free flow of clear CSF was obtained prior to injecting local anesthetic into the CSF.  The spinal needle aspirated freely following injection.  The needle was carefully withdrawn.  The patient tolerated the procedure well. Consent was obtained prior to procedure with all questions answered and concerns addressed. Risks including but not limited to bleeding, infection, nerve damage, paralysis, failed block, inadequate analgesia, allergic reaction, high spinal, itching and headache were discussed and the patient wished to proceed.   Hoy Morn, MD  **Attempt x2 by CRNA, attempt x1 by MDA with CSF return.

## 2019-07-28 NOTE — Plan of Care (Signed)
  Problem: Education: Goal: Knowledge of the prescribed therapeutic regimen will improve Outcome: Progressing   Problem: Pain Management: Goal: Pain level will decrease with appropriate interventions Outcome: Progressing   Problem: Clinical Measurements: Goal: Postoperative complications will be avoided or minimized Outcome: Progressing

## 2019-07-28 NOTE — Progress Notes (Signed)
Pt somnolent, oral airway in place.

## 2019-07-28 NOTE — Progress Notes (Signed)
Dr. Gifford Shave notified that pt did not take carvedilol this morning due to having an upset stomach when taking it without food. Per Dr. Gifford Shave, will give beta blocker during surgery IV.  Beth, CRNA aware.

## 2019-07-28 NOTE — Evaluation (Addendum)
Physical Therapy Evaluation Patient Details Name: Jacob Gordon MRN: [id number] DOB: 24-Nov-1955 Today's Date: 07/28/2019   History of Present Illness  Pt is 63 y.o male s/p Rt THA ant appraoch on 07/28/19 with PMH significant for OA, DM, CAD, HTN, GERD, and HLD.  Clinical Impression  Jacob Gordon is a 63 y.o. male POD 0 s/p Rt THA. Patient reports independence with mobility at baseline. Patient is now limited by functional impairments (see PT problem list below) and requires min assist for bed mobility and transfers with RW. Patient was limited today by post-op nausea and vomiting and gait was not tested. Patient educated on precautions and instructed in exercise to facilitate ROM and circulation to manage edema. Patient will benefit from continued skilled PT interventions to address impairments and progress towards PLOF. Acute PT will follow to progress mobility and stair training in preparation for safe discharge home.      Follow Up Recommendations Follow surgeon's recommendation for DC plan and follow-up therapies    Equipment Recommendations  None recommended by PT    Recommendations for Other Services       Precautions / Restrictions Precautions Precautions: Fall Restrictions Weight Bearing Restrictions: No      Mobility  Bed Mobility Overal bed mobility: Needs Assistance Bed Mobility: Supine to Sit;Sit to Supine     Supine to sit: Min assist Sit to supine: Min assist   General bed mobility comments: assistance for Rt LE mobility and cues for sequencing turning and use of bed rails  Transfers Overall transfer level: Needs assistance Equipment used: Rolling walker (2 wheeled) Transfers: Sit to/from Stand Sit to Stand: Min assist         General transfer comment: cues for safe hand placement required and min assist to initiate power up as well as steady upon rising  Ambulation/Gait             General Gait Details: NT due to  nausea/vomitting  Stairs            Wheelchair Mobility    Modified Rankin (Stroke Patients Only)       Balance Overall balance assessment: Needs assistance Sitting-balance support: Feet supported;Bilateral upper extremity supported Sitting balance-Leahy Scale: Fair     Standing balance support: During functional activity;Bilateral upper extremity supported Standing balance-Leahy Scale: Poor            Pertinent Vitals/Pain Pain Assessment: Faces Faces Pain Scale: Hurts even more Pain Location: Rt hip Pain Descriptors / Indicators: Grimacing Pain Intervention(s): Limited activity within patient's tolerance;Monitored during session;Ice applied    Home Living Family/patient expects to be discharged to:: Private residence Living Arrangements: Spouse/significant other Available Help at Discharge: Family Type of Home: House Home Access: Stairs to enter Entrance Stairs-Rails: None Entrance Stairs-Number of Steps: 3 Home Layout: Two level;Able to live on main level with bedroom/bathroom;Full bath on main level Home Equipment: Shower seat - built in;Walker - 2 wheels Additional Comments: pt's wife is still working but she is going to stay at home until Monday and can keep staying home after that if needed.    Prior Function Level of Independence: Independent         Comments: pt was mobilizing with no device     Hand Dominance        Extremity/Trunk Assessment   Upper Extremity Assessment Upper Extremity Assessment: Overall WFL for tasks assessed    Lower Extremity Assessment Lower Extremity Assessment: Generalized weakness    Cervical / Trunk Assessment Cervical /  Trunk Assessment: Normal  Communication   Communication: No difficulties  Cognition Arousal/Alertness: Lethargic;Suspect due to medications(pt given morphine prior to treatment) Behavior During Therapy: [facility] for tasks assessed/performed Overall Cognitive Status: Within Functional Limits  for tasks assessed          General Comments: pt lethargic at start of session but with verbal cues and mobility he became more alert      General Comments      Exercises Total Joint Exercises Quad Sets: AROM;5 reps;Right;Supine Heel Slides: AROM;5 reps;Supine;Right   Assessment/Plan    PT Assessment Patient needs continued PT services  PT Problem List Decreased balance;Decreased strength;Decreased range of motion;Decreased mobility;Decreased knowledge of use of DME;Decreased activity tolerance       PT Treatment Interventions DME instruction;Functional mobility training;Balance training;Patient/family education;Modalities;Therapeutic activities;Gait training;Stair training;Therapeutic exercise    PT Goals (Current goals can be found in the Care Plan section)  Acute Rehab PT Goals Patient Stated Goal: to not feel so nauseous PT Goal Formulation: With patient Time For Goal Achievement: 08/04/19 Potential to Achieve Goals: Good    Frequency 7X/week    AM-PAC PT "6 Clicks" Mobility  Outcome Measure Help needed turning from your back to your side while in a flat bed without using bedrails?: A Little Help needed moving from lying on your back to sitting on the side of a flat bed without using bedrails?: A Little Help needed moving to and from a bed to a chair (including a wheelchair)?: A Little Help needed standing up from a chair using your arms (e.g., wheelchair or bedside chair)?: A Little Help needed to walk in hospital room?: A Little Help needed climbing 3-5 steps with a railing? : A Lot 6 Click Score: 17    End of Session Equipment Utilized During Treatment: Gait belt Activity Tolerance: Treatment limited secondary to medical complications (Comment)(pt limited by nausea with emesis) Patient left: in bed;with call bell/phone within reach;with bed alarm set;with family/visitor present Nurse Communication: Mobility status PT Visit Diagnosis: Unsteadiness on feet  (R26.81);Muscle weakness (generalized) (M62.81);Other abnormalities of gait and mobility (R26.89);Difficulty in walking, not elsewhere classified (R26.2)    Time: 5284-1324 PT Time Calculation (min) (ACUTE ONLY): 23 min   Charges:   PT Evaluation $PT Eval Low Complexity: 1 Low        Kipp Brood, PT, DPT, [facility] Physical Therapist with [facility]  07/28/2019 5:55 PM

## 2019-07-28 NOTE — Discharge Instructions (Signed)
°Dr. Frank Aluisio °Total Joint Specialist °[facility] °[address] °[place], [place] [address] °[phone] ° °ANTERIOR APPROACH TOTAL HIP REPLACEMENT POSTOPERATIVE DIRECTIONS ° ° °Hip Rehabilitation, Guidelines Following Surgery  °The results of a hip operation are greatly improved after range of motion and muscle strengthening exercises. Follow all safety measures which are given to protect your hip. If any of these exercises cause increased pain or swelling in your joint, decrease the amount until you are comfortable again. Then slowly increase the exercises. Call your caregiver if you have problems or questions.  ° °HOME CARE INSTRUCTIONS  °• Remove items at home which could result in a fall. This includes throw rugs or furniture in walking pathways.  °· ICE to the affected hip every three hours for 30 minutes at a time and then as needed for pain and swelling.  Continue to use ice on the hip for pain and swelling from surgery. You may notice swelling that will progress down to the foot and ankle.  This is normal after surgery.  Elevate the leg when you are not up walking on it.   °· Continue to use the breathing machine which will help keep your temperature down.  It is common for your temperature to cycle up and down following surgery, especially at night when you are not up moving around and exerting yourself.  The breathing machine keeps your lungs expanded and your temperature down. ° °DIET °You may resume your previous home diet once your are discharged from the hospital. ° °DRESSING / WOUND CARE / SHOWERING °You may shower 3 days after surgery, but keep the wounds dry during showering.  You may use an occlusive plastic wrap (Press'n Seal for example), NO SOAKING/SUBMERGING IN THE BATHTUB.  If the bandage gets wet, change with a clean dry gauze.  If the incision gets wet, pat the wound dry with a clean towel. °You may start showering once you are discharged home but do not submerge the  incision under water. Just pat the incision dry and apply a dry gauze dressing on daily. °Change the surgical dressing daily and reapply a dry dressing each time. ° °ACTIVITY °Walk with your walker as instructed. °Use walker as long as suggested by your caregivers. °Avoid periods of inactivity such as sitting longer than an hour when not asleep. This helps prevent blood clots.  °You may resume a sexual relationship in one month or when given the OK by your doctor.  °You may return to work once you are cleared by your doctor.  °Do not drive a car for 6 weeks or until released by you surgeon.  °Do not drive while taking narcotics. ° °WEIGHT BEARING °Weight bearing as tolerated with assist device (walker, cane, etc) as directed, use it as long as suggested by your surgeon or therapist, typically at least 4-6 weeks. ° °POSTOPERATIVE CONSTIPATION PROTOCOL °Constipation - defined medically as fewer than three stools per week and severe constipation as less than one stool per week. ° °One of the most common issues patients have following surgery is constipation.  Even if you have a regular bowel pattern at home, your normal regimen is likely to be disrupted due to multiple reasons following surgery.  Combination of anesthesia, postoperative narcotics, change in appetite and fluid intake all can affect your bowels.  In order to avoid complications following surgery, here are some recommendations in order to help you during your recovery period. ° °Colace (docusate) - Pick up an over-the-counter form   of Colace or another stool softener and take twice a day as long as you are requiring postoperative pain medications.  Take with a full glass of water daily.  If you experience loose stools or diarrhea, hold the colace until you stool forms back up.  If your symptoms do not get better within 1 week or if they get worse, check with your doctor. ° °Dulcolax (bisacodyl) - Pick up over-the-counter and take as directed by the product  packaging as needed to assist with the movement of your bowels.  Take with a full glass of water.  Use this product as needed if not relieved by Colace only.  ° °MiraLax (polyethylene glycol) - Pick up over-the-counter to have on hand.  MiraLax is a solution that will increase the amount of water in your bowels to assist with bowel movements.  Take as directed and can mix with a glass of water, juice, soda, coffee, or tea.  Take if you go more than two days without a movement. °Do not use MiraLax more than once per day. Call your doctor if you are still constipated or irregular after using this medication for 7 days in a row. ° °If you continue to have problems with postoperative constipation, please contact the office for further assistance and recommendations.  If you experience "the worst abdominal pain ever" or develop nausea or vomiting, please contact the office immediatly for further recommendations for treatment. ° °ITCHING ° If you experience itching with your medications, try taking only a single pain pill, or even half a pain pill at a time.  You can also use Benadryl over the counter for itching or also to help with sleep.  ° °TED HOSE STOCKINGS °Wear the elastic stockings on both legs for three weeks following surgery during the day but you may remove then at night for sleeping. ° °MEDICATIONS °See your medication summary on the “After Visit Summary” that the nursing staff will review with you prior to discharge.  You may have some home medications which will be placed on hold until you complete the course of blood thinner medication.  It is important for you to complete the blood thinner medication as prescribed by your surgeon.  Continue your approved medications as instructed at time of discharge. ° °PRECAUTIONS °If you experience chest pain or shortness of breath - call 911 immediately for transfer to the hospital emergency department.  °If you develop a fever greater that 101 F, purulent drainage  from wound, increased redness or drainage from wound, foul odor from the wound/dressing, or calf pain - CONTACT YOUR SURGEON.   °                                                °FOLLOW-UP APPOINTMENTS °Make sure you keep all of your appointments after your operation with your surgeon and caregivers. You should call the office at the above phone number and make an appointment for approximately two weeks after the date of your surgery or on the date instructed by your surgeon outlined in the "After Visit Summary". ° °RANGE OF MOTION AND STRENGTHENING EXERCISES  °These exercises are designed to help you keep full movement of your hip joint. Follow your caregiver's or physical therapist's instructions. Perform all exercises about fifteen times, three times per day or as directed. Exercise both hips, even if you have   had only one joint replacement. These exercises can be done on a training (exercise) mat, on the floor, on a table or on a bed. Use whatever works the best and is most comfortable for you. Use music or television while you are exercising so that the exercises are a pleasant break in your day. This will make your life better with the exercises acting as a break in routine you can look forward to.  °• Lying on your back, slowly slide your foot toward your buttocks, raising your knee up off the floor. Then slowly slide your foot back down until your leg is straight again.  °• Lying on your back spread your legs as far apart as you can without causing discomfort.  °• Lying on your side, raise your upper leg and foot straight up from the floor as far as is comfortable. Slowly lower the leg and repeat.  °• Lying on your back, tighten up the muscle in the front of your thigh (quadriceps muscles). You can do this by keeping your leg straight and trying to raise your heel off the floor. This helps strengthen the largest muscle supporting your knee.  °• Lying on your back, tighten up the muscles of your buttocks both  with the legs straight and with the knee bent at a comfortable angle while keeping your heel on the floor.  ° °IF YOU ARE TRANSFERRED TO A SKILLED REHAB FACILITY °If the patient is transferred to a skilled rehab facility following release from the hospital, a list of the current medications will be sent to the facility for the patient to continue.  When discharged from the skilled rehab facility, please have the facility set up the patient's Home Health Physical Therapy prior to being released. Also, the skilled facility will be responsible for providing the patient with their medications at time of release from the facility to include their pain medication, the muscle relaxants, and their blood thinner medication. If the patient is still at the rehab facility at time of the two week follow up appointment, the skilled rehab facility will also need to assist the patient in arranging follow up appointment in our office and any transportation needs. ° °MAKE SURE YOU:  °• Understand these instructions.  °• Get help right away if you are not doing well or get worse.  ° ° °Pick up stool softner and laxative for home use following surgery while on pain medications. °Do not submerge incision under water. °Please use good hand washing techniques while changing dressing each day. °May shower starting three days after surgery. °Please use a clean towel to pat the incision dry following showers. °Continue to use ice for pain and swelling after surgery. °Do not use any lotions or creams on the incision until instructed by your surgeon. ° °

## 2019-07-28 NOTE — Transfer of Care (Signed)
Immediate Anesthesia Transfer of Care Note  Patient: Jacob Gordon  Procedure(s) Performed: TOTAL HIP ARTHROPLASTY ANTERIOR APPROACH (Right Hip)  Patient Location: PACU  Anesthesia Type:Spinal  Level of Consciousness: awake  Airway & Oxygen Therapy: Patient Spontanous Breathing and Patient connected to face mask oxygen  Post-op Assessment: Report given to RN and Post -op Vital signs reviewed and stable  Post vital signs: Reviewed and stable  Last Vitals:  Vitals Value Taken Time  BP 105/73 07/28/19 1307  Temp    Pulse 78 07/28/19 1309  Resp 8 07/28/19 1309  SpO2 100 % 07/28/19 1309  Vitals shown include unvalidated device data.  Last Pain:  Vitals:   07/28/19 1014  TempSrc: Oral  PainSc:       Patients Stated Pain Goal: 4 (24/49/75 3005)  Complications: No apparent anesthesia complications

## 2019-07-29 ENCOUNTER — Encounter (HOSPITAL_COMMUNITY): Payer: Self-pay | Admitting: Orthopedic Surgery

## 2019-07-29 LAB — BASIC METABOLIC PANEL
Anion gap: 11 (ref 5–15)
BUN: 15 mg/dL (ref 8–23)
CO2: 22 mmol/L (ref 22–32)
Calcium: 8.8 mg/dL — ABNORMAL LOW (ref 8.9–10.3)
Chloride: 105 mmol/L (ref 98–111)
Creatinine, Ser: 1.22 mg/dL (ref 0.61–1.24)
GFR calc Af Amer: >60 mL/min (ref >60)
GFR calc non Af Amer: >60 mL/min (ref >60)
Glucose, Bld: 153 mg/dL — ABNORMAL HIGH (ref 70–99)
Potassium: 4.2 mmol/L (ref 3.5–5.1)
Sodium: 138 mmol/L (ref 135–145)

## 2019-07-29 LAB — GLUCOSE, CAPILLARY
Glucose-Capillary: 146 mg/dL — ABNORMAL HIGH (ref 70–99)
Glucose-Capillary: 143 mg/dL — ABNORMAL HIGH (ref 70–99)
Glucose-Capillary: 145 mg/dL — ABNORMAL HIGH (ref 70–99)

## 2019-07-29 LAB — CBC
HCT: 40 % (ref 39.0–52.0)
Hemoglobin: 13.1 g/dL (ref 13.0–17.0)
MCH: 32 pg (ref 26.0–34.0)
MCHC: 32.8 g/dL (ref 30.0–36.0)
MCV: 97.6 fL (ref 80.0–100.0)
Platelets: 106 10*3/uL — ABNORMAL LOW (ref 150–400)
RBC: 4.1 MIL/uL — ABNORMAL LOW (ref 4.22–5.81)
RDW: 13.6 % (ref 11.5–15.5)
WBC: 13.5 10*3/uL — ABNORMAL HIGH (ref 4.0–10.5)
nRBC: 0 % (ref 0.0–0.2)

## 2019-07-29 MED ORDER — ASPIRIN 325 MG PO TBEC: 325.0000 mg | DELAYED_RELEASE_TABLET | Freq: Two times a day (BID) | ORAL | 0 refills | Status: DC

## 2019-07-29 MED ORDER — HYDROCODONE-ACETAMINOPHEN 7.5-325 MG PO TABS: 1.0000 | ORAL_TABLET | ORAL | 0 refills | Status: DC | PRN

## 2019-07-29 MED ORDER — SODIUM CHLORIDE 0.9 % IV BOLUS
500.0000 mL | Freq: Once | INTRAVENOUS | Status: AC
  Administered 2019-07-29: 09:00:00 500 mL via INTRAVENOUS

## 2019-07-29 MED ORDER — METHOCARBAMOL 500 MG PO TABS: 500.0000 mg | ORAL_TABLET | Freq: Four times a day (QID) | ORAL | 0 refills | Status: DC | PRN

## 2019-07-29 NOTE — Progress Notes (Signed)
Physical Therapy Treatment Patient Details Name: Jacob Gordon MRN: [id number] DOB: [birth year] Today's Date: 07/29/2019    History of Present Illness Pt is 63 y.o male s/p Rt THA ant approach on 07/28/19 with PMH significant for OA, DM, CAD, HTN, GERD, and HLD.    PT Comments    Pt attempted to ambulate however reports dizziness and then weakness limiting mobility at this time.  Pt encouraged to remain upright in recliner and drink fluids.  Spouse arrived during session.    Follow Up Recommendations  Follow surgeon's recommendation for DC plan and follow-up therapies     Equipment Recommendations  None recommended by PT    Recommendations for Other Services       Precautions / Restrictions Precautions Precautions: Fall Restrictions Weight Bearing Restrictions: No Other Position/Activity Restrictions: WBAT    Mobility  Bed Mobility Overal bed mobility: Needs Assistance Bed Mobility: Supine to Sit     Supine to sit: Min guard        Transfers Overall transfer level: Needs assistance Equipment used: Rolling walker (2 wheeled) Transfers: Sit to/from Stand Sit to Stand: Min assist         General transfer comment: verbal cues for UE and LE positioning, assist to rise and control descent  Ambulation/Gait Ambulation/Gait assistance: Min assist Gait Distance (Feet): 5 Feet Assistive device: Rolling walker (2 wheeled) Gait Pattern/deviations: Step-to pattern;Decreased stance time - right;Decreased weight shift to right;Antalgic     General Gait Details: verbal cues for sequence, RW Positioning, pt reports slight dizziness so recliner brought behind pt after 5 feet; BP 90/64 mmhg and HR 51 bpm; pt took short rest break and then wished to ambulate again however only ambulated 4 more feet prior to needing recliner again; pt reports more "weakness" then dizziness with mobility end of session   Stairs             Wheelchair Mobility    Modified Rankin  (Stroke Patients Only)       Balance                                            Cognition Arousal/Alertness: Awake/alert Behavior During Therapy: WFL for tasks assessed/performed Overall Cognitive Status: Within Functional Limits for tasks assessed                                        Exercises      General Comments        Pertinent Vitals/Pain Pain Assessment: 0-10 Pain Score: 5  Pain Location: Rt hip Pain Descriptors / Indicators: Grimacing;Tightness;Sore Pain Intervention(s): Monitored during session;Repositioned    Home Living                      Prior Function            PT Goals (current goals can now be found in the care plan section) Progress towards PT goals: Progressing toward goals    Frequency    7X/week      PT Plan Current plan remains appropriate    Co-evaluation              AM-PAC PT "6 Clicks" Mobility   Outcome Measure  Help needed turning from your back to your side while in  a flat bed without using bedrails?: A Little Help needed moving from lying on your back to sitting on the side of a flat bed without using bedrails?: A Little Help needed moving to and from a bed to a chair (including a wheelchair)?: A Little Help needed standing up from a chair using your arms (e.g., wheelchair or bedside chair)?: A Little Help needed to walk in hospital room?: A Little Help needed climbing 3-5 steps with a railing? : A Lot 6 Click Score: 17    End of Session Equipment Utilized During Treatment: Gait belt Activity Tolerance: Patient tolerated treatment well Patient left: in chair;with call bell/phone within reach;with family/visitor present(aware to have staff assist back to bed for safety) Nurse Communication: Mobility status PT Visit Diagnosis: Difficulty in walking, not elsewhere classified (R26.2);Muscle weakness (generalized) (M62.81)     Time: 1448-1856 PT Time Calculation (min)  (ACUTE ONLY): 17 min  Charges:  $Gait Training: 8-22 mins                     Zenovia Jarred, PT, DPT Acute Rehabilitation Services Office: [phone] Pager: [phone]  Sarajane Jews 07/29/2019, 12:30 PM

## 2019-07-29 NOTE — Progress Notes (Signed)
Physical Therapy Treatment Patient Details Name: Jacob Gordon MRN: [id number] DOB: Nov 16, 1955 Today's Date: 07/29/2019    History of Present Illness Pt is 63 y.o male s/p Rt THA ant approach on 07/28/19 with PMH significant for OA, DM, CAD, HTN, GERD, and HLD.    PT Comments    Pt ambulated in hallway and practiced safe stair technique.  Spouse present for session and observed.  Pt denies any dizziness this afternoon.  Pt also performed LE exercises and provided with HEP.  Pt and spouse had no further questions, and pt wishes to d/c home today.   Follow Up Recommendations  Follow surgeon's recommendation for DC plan and follow-up therapies     Equipment Recommendations  None recommended by PT    Recommendations for Other Services       Precautions / Restrictions Precautions Precautions: Fall Restrictions Weight Bearing Restrictions: No Other Position/Activity Restrictions: WBAT    Mobility  Bed Mobility Overal bed mobility: Needs Assistance Bed Mobility: Supine to Sit     Supine to sit: Min guard     General bed mobility comments: pt up in recliner on arrival  Transfers Overall transfer level: Needs assistance Equipment used: Rolling walker (2 wheeled) Transfers: Sit to/from Stand Sit to Stand: Min guard;Supervision         General transfer comment: verbal cues for UE and LE positioning  Ambulation/Gait Ambulation/Gait assistance: Min guard;Supervision Gait Distance (Feet): 200 Feet Assistive device: Rolling walker (2 wheeled) Gait Pattern/deviations: Step-to pattern;Decreased stance time - right;Decreased weight shift to right;Antalgic     General Gait Details: verbal cues for sequence, RW positioning, step length, posture; no dizziness or weakness reported this afternoon   Stairs Stairs: Yes Stairs assistance: Min guard Stair Management: Forwards;Step to pattern;Alternating pattern;One rail Left Number of Stairs: 3 General stair comments:  verbal cues for sequence however pt performed alternating pattern ascending and step to descending; pt performed twice; spouse verbalized correct sequencing and observed   Wheelchair Mobility    Modified Rankin (Stroke Patients Only)       Balance                                            Cognition Arousal/Alertness: Awake/alert Behavior During Therapy: WFL for tasks assessed/performed Overall Cognitive Status: Within Functional Limits for tasks assessed                                        Exercises Total Joint Exercises Ankle Circles/Pumps: AROM;Both;10 reps Quad Sets: AROM;Right;10 reps Heel Slides: AAROM;Supine;10 reps;Right Hip ABduction/ADduction: AAROM;Supine;Standing;Right;10 reps Long Arc Quad: AROM;Seated;Right;10 reps Knee Flexion: AROM;Standing;Right;10 reps Marching in Standing: AROM;Standing;Right;10 reps Standing Hip Extension: AROM;Right;Standing;10 reps(all standing exercises performed with UE support)    General Comments        Pertinent Vitals/Pain Pain Assessment: 0-10 Pain Score: 4  Pain Location: Rt hip Pain Descriptors / Indicators: Grimacing;Tightness;Sore Pain Intervention(s): Monitored during session;Repositioned    Home Living                      Prior Function            PT Goals (current goals can now be found in the care plan section) Progress towards PT goals: Progressing toward goals    Frequency  7X/week      PT Plan Current plan remains appropriate    Co-evaluation              AM-PAC PT "6 Clicks" Mobility   Outcome Measure  Help needed turning from your back to your side while in a flat bed without using bedrails?: A Little Help needed moving from lying on your back to sitting on the side of a flat bed without using bedrails?: A Little Help needed moving to and from a bed to a chair (including a wheelchair)?: A Little Help needed standing up from a chair  using your arms (e.g., wheelchair or bedside chair)?: A Little Help needed to walk in hospital room?: A Little Help needed climbing 3-5 steps with a railing? : A Little 6 Click Score: 18    End of Session Equipment Utilized During Treatment: Gait belt Activity Tolerance: Patient tolerated treatment well Patient left: in chair;with family/visitor present;with call bell/phone within reach Nurse Communication: Mobility status PT Visit Diagnosis: Difficulty in walking, not elsewhere classified (R26.2);Muscle weakness (generalized) (M62.81)     Time: 3009-2330 PT Time Calculation (min) (ACUTE ONLY): 19 min  Charges:   $Therapeutic Exercise: 8-22 mins                    Zenovia Jarred, PT, DPT Acute Rehabilitation Services Office: [phone] Pager: [phone]   Sarajane Jews 07/29/2019, 3:45 PM

## 2019-07-29 NOTE — Progress Notes (Signed)
   Subjective: 1 Day Post-Op Procedure(s) (LRB): TOTAL HIP ARTHROPLASTY ANTERIOR APPROACH (Right) Patient reports pain as mild.   Patient seen in rounds with Dr. Wynelle Link. Patient is having some issues with lightheadedness. Had a syncopal episode this morning, likely due to orthostatic hypotension. Patient reports that he feels better now. Has some discomfort over the right hip. Voiding well. Positive flatus. No SOB or chest pain.  Plan is to go Home after hospital stay.  Objective: Vital signs in last 24 hours: Temp:  [97.5 F (36.4 C)-99 F (37.2 C)] 98.3 F (36.8 C) (09/24 0639) Pulse Rate:  [43-73] 52 (09/24 0639) Resp:  [10-19] 14 (09/24 0418) BP: (100-151)/(64-101) 100/72 (09/24 0639) SpO2:  [98 %-100 %] 100 % (09/24 0639) Weight:  [91.6 kg] 91.6 kg (09/23 1007)  Intake/Output from previous day:  Intake/Output Summary (Last 24 hours) at 07/29/2019 0720 Last data filed at 07/29/2019 0418 Gross per 24 hour  Intake 2721.6 ml  Output 2297 ml  Net 424.6 ml     Labs: Recent Labs    07/28/19 1021 07/29/19 0257  HGB 15.1 13.1   Recent Labs    07/28/19 1021 07/29/19 0257  WBC 5.6 13.5*  RBC 4.73 4.10*  HCT 45.3 40.0  PLT 126* 106*   Recent Labs    07/29/19 0257  NA 138  K 4.2  CL 105  CO2 22  BUN 15  CREATININE 1.22  GLUCOSE 153*  CALCIUM 8.8*    EXAM General - Patient is Alert and Oriented Extremity - Neurologically intact Intact pulses distally Dorsiflexion/Plantar flexion intact No cellulitis present Compartment soft Dressing - dressing C/D/I Motor Function - intact, moving foot and toes well on exam.  Hemovac pulled without difficulty.  Past Medical History:  Diagnosis Date  . ALLERGIC RHINITIS   . Chronic headaches    migraines  . Complication of anesthesia    violent vomiting   . Coronary artery disease    non obstructive  . DEGENERATIVE DISC DISEASE   . DIABETES MELLITUS, TYPE II    type 2  . GERD   . History of kidney stones   .  HLD (hyperlipidemia)   . HYPERTENSION   . Internal hemorrhoids   . OSTEOARTHRITIS   . PONV (postoperative nausea and vomiting)   . Sleep apnea    no cpap mild case  . THROMBOCYTOPENIA     Assessment/Plan: 1 Day Post-Op Procedure(s) (LRB): TOTAL HIP ARTHROPLASTY ANTERIOR APPROACH (Right) Principal Problem:   OA (osteoarthritis) of hip  Estimated body mass index is 29.83 kg/m as calculated from the following:   Height as of this encounter: 5\' 9"  (1.753 m).   Weight as of this encounter: 91.6 kg. Advance diet Up with therapy  DVT Prophylaxis - Aspirin Weight Bearing As Tolerated  Hemovac Pulled  Will give 540ml bolus this morning. Keep fluids running at 100 until after lunch. If BP stable, tolerating POs well, and patient asymptomatic, DC IV fluids. Will start PT today. Needs two sessions. If meeting goals, possible DC home this afternoon. Follow up in office in 2 weeks. Discharge instructions given.   Ardeen Jourdain, PA-C Orthopaedic Surgery 07/29/2019, 7:20 AM

## 2019-07-29 NOTE — TOC Transition Note (Signed)
Transition of Care [facility]) - CM/SW Discharge Note   Patient Details  Name: Jacob Gordon MRN: [id number] Date of Birth: 07/18/1956  Transition of Care [facility]) CM/SW Contact:  Leeroy Cha, RN Phone Number: 07/29/2019, 10:59 AM   Clinical Narrative:    dcd to home with HEP.  Has needed equipment at home.   Final next level of care: Home/Self Care Barriers to Discharge: No Barriers Identified   Patient Goals and CMS Choice Patient states their goals for this hospitalization and ongoing recovery are:: to go home and dot he excerises CMS Medicare.gov Compare Post Acute Care list provided to:: Patient    Discharge Placement                       Discharge Plan and Services                                     Social Determinants of Health (SDOH) Interventions     Readmission Risk Interventions No flowsheet data found.

## 2019-07-29 NOTE — Progress Notes (Addendum)
Pt requested to stand up. When  He stood up, c/o feeling dizzy and lightheaded. Pt become unresponsive for a few seconds. Pt was saefly put back to bad. V/s : BP-100/72, p-52. Pulse Ox -100 on O2 via n/c . T-98.3 .BS-146. Pt reports that he feels better and feeling sleepy.Dr. Wynelle Link on the unit and was made aware.

## 2019-07-30 ENCOUNTER — Telehealth: Payer: Self-pay | Admitting: *Deleted

## 2019-07-30 NOTE — Telephone Encounter (Signed)
Pt was on TCM report admitted 07/28/19 for primary osteoarthritis right hip. Pt underwent TOTAL HIP ARTHROPLASTY ANTERIOR APPROACH   procedure without complications. Pt D/C 07/29/19, and will follow-up w/specialist in 2 weeks.Marland KitchenJohny Chess

## 2019-07-30 NOTE — Discharge Summary (Signed)
Physician Discharge Summary   Patient ID: Jacob Gordon MRN: [id number] DOB/AGE: 12-16-1955 63 y.o.  Admit date: 07/28/2019 Discharge date: 07/29/2019  Primary Diagnosis: Primary osteoarthritis right hip   Admission Diagnoses:  Past Medical History:  Diagnosis Date   ALLERGIC RHINITIS    Chronic headaches    migraines   Complication of anesthesia    violent vomiting    Coronary artery disease    non obstructive   DEGENERATIVE DISC DISEASE    DIABETES MELLITUS, TYPE II    type 2   GERD    History of kidney stones    HLD (hyperlipidemia)    HYPERTENSION    Internal hemorrhoids    OSTEOARTHRITIS    PONV (postoperative nausea and vomiting)    Sleep apnea    no cpap mild case   THROMBOCYTOPENIA    Discharge Diagnoses:   Principal Problem:   OA (osteoarthritis) of hip  Estimated body mass index is 29.83 kg/m as calculated from the following:   Height as of this encounter:  (1.753 m).   Weight as of this encounter: 91.6 kg.  Procedure(s) (LRB): TOTAL HIP ARTHROPLASTY ANTERIOR APPROACH (Right)   Consults: None  HPI: Kemontae Dunklee, 63 y.o. male, has a history of pain and functional disability in the right hip(s) due to arthritis and patient has failed non-surgical conservative treatments for greater than 12 weeks to include NSAID's and/or analgesics, corticosteriod injections, flexibility and strengthening excercises and activity modification.  Onset of symptoms was gradual starting 4 years ago with gradually worsening course since that time.The patient noted no past surgery on the right hip(s).  Patient currently rates pain in the right hip at 7 out of 10 with activity. Patient has night pain, worsening of pain with activity and weight bearing, pain that interfers with activities of daily living and pain with passive range of motion. Patient has evidence of periarticular osteophytes and joint space narrowing by imaging studies. This condition  presents safety issues increasing the risk of falls.  There is no current active infection.  Laboratory Data: Admission on 07/28/2019, Discharged on 07/29/2019  Component Date Value Ref Range Status   Glucose-Capillary 07/28/2019 120* 70 - 99 mg/dL Final   Comment 1 40/98/1191 Notify RN   Final   Comment 2 07/28/2019 Document in Chart   Final   WBC 07/28/2019 5.6  4.0 - 10.5 K/uL Final   RBC 07/28/2019 4.73  4.22 - 5.81 MIL/uL Final   Hemoglobin 07/28/2019 15.1  13.0 - 17.0 g/dL Final   HCT 47/82/9562 45.3  39.0 - 52.0 % Final   MCV 07/28/2019 95.8  80.0 - 100.0 fL Final   MCH 07/28/2019 31.9  26.0 - 34.0 pg Final   MCHC 07/28/2019 33.3  30.0 - 36.0 g/dL Final   RDW 13/06/6577 13.7  11.5 - 15.5 % Final   Platelets 07/28/2019 126* 150 - 400 K/uL Final   nRBC 07/28/2019 0.0  0.0 - 0.2 % Final   Performed at [facility], [address] [place], [place] [address]   Glucose-Capillary 07/28/2019 161* 70 - 99 mg/dL Final   WBC 95/28/4132 13.5* 4.0 - 10.5 K/uL Final   RBC 07/29/2019 4.10* 4.22 - 5.81 MIL/uL Final   Hemoglobin 07/29/2019 13.1  13.0 - 17.0 g/dL Final   HCT 44/11/270 40.0  39.0 - 52.0 % Final   MCV 07/29/2019 97.6  80.0 - 100.0 fL Final   MCH 07/29/2019 32.0  26.0 - 34.0 pg Final  MCHC 07/29/2019 32.8  30.0 - 36.0 g/dL Final   RDW 66/44/0347 13.6  11.5 - 15.5 % Final   Platelets 07/29/2019 106* 150 - 400 K/uL Final   Comment: REPEATED TO VERIFY PLATELET COUNT CONFIRMED BY SMEAR Immature Platelet Fraction may be clinically indicated, consider ordering this additional test [id number]    nRBC 07/29/2019 0.0  0.0 - 0.2 % Final   Performed at [facility], [address], [place], [place] [address]   Sodium 07/29/2019 138  135 - 145 mmol/L Final   Potassium 07/29/2019 4.2  3.5 - 5.1 mmol/L Final   Chloride 07/29/2019 105  98 - 111 mmol/L Final   CO2 07/29/2019 22  22 - 32 mmol/L Final   Glucose, Bld  07/29/2019 153* 70 - 99 mg/dL Final   BUN 32/95/1884 15  8 - 23 mg/dL Final   Creatinine, Ser 07/29/2019 1.22  0.61 - 1.24 mg/dL Final   Calcium 16/60/6301 8.8* 8.9 - 10.3 mg/dL Final   GFR calc non Af Amer 07/29/2019 >60  >60 mL/min Final   GFR calc Af Amer 07/29/2019 >60  >60 mL/min Final   Anion gap 07/29/2019 11  5 - 15 Final   Performed at [facility], [address], [place], [place] [address]   Glucose-Capillary 07/28/2019 141* 70 - 99 mg/dL Final   Glucose-Capillary 07/29/2019 146* 70 - 99 mg/dL Final   Glucose-Capillary 07/29/2019 143* 70 - 99 mg/dL Final   Glucose-Capillary 07/29/2019 145* 70 - 99 mg/dL Final  Hospital Outpatient Visit on 07/24/2019  Component Date Value Ref Range Status   SARS-CoV-2, NAA 07/24/2019 NOT DETECTED  NOT DETECTED Final   Comment: (NOTE) This nucleic acid amplification test was developed and its performance characteristics determined by [employer]. Nucleic acid amplification tests include PCR and TMA. This test has not been FDA cleared or approved. This test has been authorized by FDA under an Emergency Use Authorization (EUA). This test is only authorized for the duration of time the declaration that circumstances exist justifying the authorization of the emergency use of in vitro diagnostic tests for detection of SARS-CoV-2 virus and/or diagnosis of COVID-19 infection under section 564(b)(1) of the Act, 21 U.S.C. 323FTD-3(U) (1), unless the authorization is terminated or revoked sooner. When diagnostic testing is negative, the possibility of a false negative result should be considered in the context of a patient's recent exposures and the presence of clinical signs and symptoms consistent with COVID-19. An individual without symptoms of COVID- 19 and who is not shedding SARS-CoV-2 vi                          rus would expect to have a negative (not detected) result in this assay. Performed At: [facility] [address] [place], [place] [id number] Jolene Schimke MD CB:[id number]    Coronavirus Source 07/24/2019 NASOPHARYNGEAL   Final   Performed at [facility], [address], [place], [place] [address]  Hospital Outpatient Visit on 07/22/2019  Component Date Value Ref Range Status   aPTT 07/22/2019 27  24 - 36 seconds Final   Performed at [facility], [address] [place], [place] [address]   WBC 07/22/2019 6.0  4.0 - 10.5 K/uL Final   RBC 07/22/2019 4.96  4.22 - 5.81 MIL/uL Final   Hemoglobin 07/22/2019 15.9  13.0 - 17.0 g/dL Final   HCT 69/48/5462 47.8  39.0 - 52.0 % Final  MCV 07/22/2019 96.4  80.0 - 100.0 fL Final   MCH 07/22/2019 32.1  26.0 - 34.0 pg Final   MCHC 07/22/2019 33.3  30.0 - 36.0 g/dL Final   RDW 16/08/9603 13.7  11.5 - 15.5 % Final   Platelets 07/22/2019 141* 150 - 400 K/uL Final   nRBC 07/22/2019 0.0  0.0 - 0.2 % Final   Performed at [facility], [address] [place], [place] [address]   Sodium 07/22/2019 141  135 - 145 mmol/L Final   Potassium 07/22/2019 4.2  3.5 - 5.1 mmol/L Final   Chloride 07/22/2019 105  98 - 111 mmol/L Final   CO2 07/22/2019 28  22 - 32 mmol/L Final   Glucose, Bld 07/22/2019 97  70 - 99 mg/dL Final   BUN 11/91/4782 15  8 - 23 mg/dL Final   Creatinine, Ser 07/22/2019 1.24  0.61 - 1.24 mg/dL Final   Calcium 95/62/1308 9.5  8.9 - 10.3 mg/dL Final   Total Protein 65/78/4696 7.3  6.5 - 8.1 g/dL Final   Albumin 29/52/8413 4.4  3.5 - 5.0 g/dL Final   AST 24/40/1027 22  15 - 41 U/L Final   ALT 07/22/2019 23  0 - 44 U/L Final   Alkaline Phosphatase 07/22/2019 89  38 - 126 U/L Final   Total Bilirubin 07/22/2019 1.7* 0.3 - 1.2 mg/dL Final   GFR calc non Af Amer 07/22/2019 >60  >60 mL/min Final   GFR calc Af Amer 07/22/2019 >60  >60 mL/min Final   Anion gap 07/22/2019 8  5 - 15 Final   Performed at [facility], [address] [place], [place] [address]   Prothrombin Time 07/22/2019 14.6  11.4 - 15.2 seconds Final   INR 07/22/2019 1.2  0.8 - 1.2 Final   Comment: (NOTE) INR goal varies based on device and disease states. Performed at [facility], [address], [place], [place] [address]    ABO/RH(D) 07/22/2019 A POS   Final   Antibody Screen 07/22/2019 NEG   Final   Sample Expiration 07/22/2019 07/31/2019,2359   Final   Extend sample reason 07/22/2019    Final                   Value:NO TRANSFUSIONS OR PREGNANCY IN THE PAST 3 MONTHS Performed at [facility], [address] [place], [place] [address]    MRSA, PCR 07/22/2019 NEGATIVE  NEGATIVE Final   Staphylococcus aureus 07/22/2019 NEGATIVE  NEGATIVE Final   Comment: (NOTE) The Xpert SA Assay (FDA approved for NASAL specimens in patients 14 years of age and older), is one component of a comprehensive surveillance program. It is not intended to diagnose infection nor to guide or monitor treatment. Performed at [facility], [address], [place], [place] [address]    Glucose-Capillary 07/22/2019 145* 70 - 99 mg/dL Final   ABO/RH(D) 56/43/3295    Final                   Value:A POS Performed at [facility], [address], [place], [place] [address]   Appointment on 06/10/2019  Component Date Value Ref Range Status   Hgb A1c MFr Bld 06/10/2019 6.8* 4.6 - 6.5 % Final   Glycemic Control Guidelines for People with Diabetes:Non Diabetic:  <6%Goal of Therapy: <7%Additional Action Suggested:  >8%    INR 06/10/2019 1.1* 0.8 - 1.0 ratio Final   Prothrombin Time 06/10/2019  13.2* 9.6 - 13.1 sec Final   VITD 06/10/2019 39.64  30.00 - 100.00 ng/mL Final   TSH 06/10/2019 0.93  0.35 - 4.50 uIU/mL Final   Total Bilirubin 06/10/2019 1.1  0.2 - 1.2 mg/dL Final   Bilirubin, Direct 06/10/2019 0.3  0.0 - 0.3 mg/dL Final   Alkaline Phosphatase 06/10/2019 81  39 - 117 U/L  Final   AST 06/10/2019 19  0 - 37 U/L Final   ALT 06/10/2019 23  0 - 53 U/L Final   Total Protein 06/10/2019 6.7  6.0 - 8.3 g/dL Final   Albumin 16/08/9603 4.2  3.5 - 5.2 g/dL Final   WBC 54/07/8118 6.4  4.0 - 10.5 K/uL Final   RBC 06/10/2019 4.72  4.22 - 5.81 Mil/uL Final   Hemoglobin 06/10/2019 15.3  13.0 - 17.0 g/dL Final   HCT 14/78/2956 45.3  39.0 - 52.0 % Final   MCV 06/10/2019 96.0  78.0 - 100.0 fl Final   MCHC 06/10/2019 33.8  30.0 - 36.0 g/dL Final   RDW 21/30/8657 14.4  11.5 - 15.5 % Final   Platelets 06/10/2019 130.0* 150.0 - 400.0 K/uL Final   Neutrophils Relative % 06/10/2019 62.3  43.0 - 77.0 % Final   Lymphocytes Relative 06/10/2019 25.4  12.0 - 46.0 % Final   Monocytes Relative 06/10/2019 9.3  3.0 - 12.0 % Final   Eosinophils Relative 06/10/2019 2.5  0.0 - 5.0 % Final   Basophils Relative 06/10/2019 0.5  0.0 - 3.0 % Final   Neutro Abs 06/10/2019 4.0  1.4 - 7.7 K/uL Final   Lymphs Abs 06/10/2019 1.6  0.7 - 4.0 K/uL Final   Monocytes Absolute 06/10/2019 0.6  0.1 - 1.0 K/uL Final   Eosinophils Absolute 06/10/2019 0.2  0.0 - 0.7 K/uL Final   Basophils Absolute 06/10/2019 0.0  0.0 - 0.1 K/uL Final   Sodium 06/10/2019 142  135 - 145 mEq/L Final   Potassium 06/10/2019 4.0  3.5 - 5.1 mEq/L Final   Chloride 06/10/2019 108  96 - 112 mEq/L Final   CO2 06/10/2019 28  19 - 32 mEq/L Final   Glucose, Bld 06/10/2019 112* 70 - 99 mg/dL Final   BUN 84/69/6295 17  6 - 23 mg/dL Final   Creatinine, Ser 06/10/2019 1.37  0.40 - 1.50 mg/dL Final   Calcium 28/41/3244 9.5  8.4 - 10.5 mg/dL Final   GFR 11/06/7251 63.58  >60.00 mL/min Final     X-Rays:Dg Pelvis Portable  Result Date: 07/28/2019 CLINICAL DATA:  Post right hip arthroplasty. EXAM: PORTABLE PELVIS 1-2 VIEWS COMPARISON:  Intraoperative film of 07/28/2019, CT pelvis from 2008 FINDINGS: Single AP projection shows placement of a right hip arthroplasty without complicating features. No acute findings  noted in the pelvis. IMPRESSION: Uncomplicated appearance of right hip arthroplasty. Electronically Signed   By: Donzetta Kohut M.D.   On: 07/28/2019 13:54   Dg C-arm 1-60 Min-no Report  Result Date: 07/28/2019 Fluoroscopy was utilized by the requesting physician.  No radiographic interpretation.   Dg Hip Operative Unilat W Or W/o Pelvis Right  Result Date: 07/28/2019 CLINICAL DATA:  Right hip replacement. EXAM: OPERATIVE right HIP (WITH PELVIS IF PERFORMED) 4 VIEWS TECHNIQUE: Fluoroscopic spot image(s) were submitted for interpretation post-operatively. FLUOROSCOPY TIME:  18 seconds. COMPARISON:  Radiographs of August 11, 2018. FINDINGS: Four intraoperative fluoroscopic images demonstrate placement of right acetabular and femoral components. IMPRESSION: Fluoroscopic guidance provided during right total hip arthroplasty. Electronically Signed   By: Zenda Alpers.D.  On: 07/28/2019 13:11    EKG: Orders placed or performed in visit on 06/10/19   EKG     Hospital Course: Patient was admitted to [facility] and taken to the OR and underwent the above state procedure without complications.  Patient tolerated the procedure well and was later transferred to the recovery room and then to the orthopaedic floor for postoperative care.  They were given PO and IV analgesics for pain control following their surgery.  They were given 24 hours of postoperative antibiotics of  Anti-infectives (From admission, onward)   Start     Dose/Rate Route Frequency Ordered Stop   07/28/19 1730  ceFAZolin (ANCEF) IVPB 2g/100 mL premix     2 g 200 mL/hr over 30 Minutes Intravenous Every 6 hours 07/28/19 1607 07/28/19 2339   07/28/19 0945  ceFAZolin (ANCEF) IVPB 2g/100 mL premix     2 g 200 mL/hr over 30 Minutes Intravenous On call to O.R. 07/28/19 6712 07/28/19 1143     and started on DVT prophylaxis in the form of Aspirin.   PT and OT were ordered for total hip protocol.  The patient was allowed to be  WBAT with therapy. Discharge planning was consulted to help with postop disposition and equipment needs.  Patient had a fair night on the evening of surgery.  They started to get up OOB with therapy on day one. Had some trouble wit orthostatic hypotension but responded to BP med modification and bolus.  Hemovac drain was pulled without difficulty.  The patient had progressed with therapy and meeting their goals.  Incision was healing well.  Patient was seen in rounds and was ready to go home.  Diet: Cardiac diet Activity:WBAT Follow-up:in 2 weeks Disposition - Home Discharged Condition: stable   Discharge Instructions    Call MD / Call 911   Complete by: As directed    If you experience chest pain or shortness of breath, CALL 911 and be transported to the hospital emergency room.  If you develope a fever above 101 F, pus (white drainage) or increased drainage or redness at the wound, or calf pain, call your surgeon's office.   Constipation Prevention   Complete by: As directed    Drink plenty of fluids.  Prune juice may be helpful.  You may use a stool softener, such as Colace (over the counter) 100 mg twice a day.  Use MiraLax (over the counter) for constipation as needed.   Diet - low sodium heart healthy   Complete by: As directed    Discharge instructions   Complete by: As directed    Dr. Ollen Gross Total Joint Specialist [facility] [address] [place], [place] [address] [phone]  ANTERIOR APPROACH TOTAL HIP REPLACEMENT POSTOPERATIVE DIRECTIONS   Hip Rehabilitation, Guidelines Following Surgery  The results of a hip operation are greatly improved after range of motion and muscle strengthening exercises. Follow all safety measures which are given to protect your hip. If any of these exercises cause increased pain or swelling in your joint, decrease the amount until you are comfortable again. Then slowly increase the exercises. Call your caregiver if you  have problems or questions.   HOME CARE INSTRUCTIONS  Remove items at home which could result in a fall. This includes throw rugs or furniture in walking pathways.  ICE to the affected hip every three hours for 30 minutes at a time and then as needed for pain and swelling.  Continue to use  ice on the hip for pain and swelling from surgery. You may notice swelling that will progress down to the foot and ankle.  This is normal after surgery.  Elevate the leg when you are not up walking on it.   Continue to use the breathing machine which will help keep your temperature down.  It is common for your temperature to cycle up and down following surgery, especially at night when you are not up moving around and exerting yourself.  The breathing machine keeps your lungs expanded and your temperature down.   DIET You may resume your previous home diet once your are discharged from the hospital.  DRESSING / WOUND CARE / SHOWERING You may shower 3 days after surgery, but keep the wounds dry during showering.  You may use an occlusive plastic wrap (Press'n Seal for example), NO SOAKING/SUBMERGING IN THE BATHTUB.  If the bandage gets wet, change with a clean dry gauze.  If the incision gets wet, pat the wound dry with a clean towel. You may start showering once you are discharged home but do not submerge the incision under water. Just pat the incision dry and apply a dry gauze dressing on daily. Change the surgical dressing daily and reapply a dry dressing each time.  ACTIVITY Walk with your walker as instructed. Use walker as long as suggested by your caregivers. Avoid periods of inactivity such as sitting longer than an hour when not asleep. This helps prevent blood clots.  You may resume a sexual relationship in one month or when given the OK by your doctor.  You may return to work once you are cleared by your doctor.  Do not drive a car for 6 weeks or until released by you surgeon.  Do not drive while  taking narcotics.  WEIGHT BEARING Weight bearing as tolerated with assist device (walker, cane, etc) as directed, use it as long as suggested by your surgeon or therapist, typically at least 4-6 weeks.  POSTOPERATIVE CONSTIPATION PROTOCOL Constipation - defined medically as fewer than three stools per week and severe constipation as less than one stool per week.  One of the most common issues patients have following surgery is constipation.  Even if you have a regular bowel pattern at home, your normal regimen is likely to be disrupted due to multiple reasons following surgery.  Combination of anesthesia, postoperative narcotics, change in appetite and fluid intake all can affect your bowels.  In order to avoid complications following surgery, here are some recommendations in order to help you during your recovery period.  Colace (docusate) - Pick up an over-the-counter form of Colace or another stool softener and take twice a day as long as you are requiring postoperative pain medications.  Take with a full glass of water daily.  If you experience loose stools or diarrhea, hold the colace until you stool forms back up.  If your symptoms do not get better within 1 week or if they get worse, check with your doctor.  Dulcolax (bisacodyl) - Pick up over-the-counter and take as directed by the product packaging as needed to assist with the movement of your bowels.  Take with a full glass of water.  Use this product as needed if not relieved by Colace only.   MiraLax (polyethylene glycol) - Pick up over-the-counter to have on hand.  MiraLax is a solution that will increase the amount of water in your bowels to assist with bowel movements.  Take as directed and can mix  with a glass of water, juice, soda, coffee, or tea.  Take if you go more than two days without a movement. Do not use MiraLax more than once per day. Call your doctor if you are still constipated or irregular after using this medication for 7  days in a row.  If you continue to have problems with postoperative constipation, please contact the office for further assistance and recommendations.  If you experience "the worst abdominal pain ever" or develop nausea or vomiting, please contact the office immediatly for further recommendations for treatment.  ITCHING  If you experience itching with your medications, try taking only a single pain pill, or even half a pain pill at a time.  You can also use Benadryl over the counter for itching or also to help with sleep.   TED HOSE STOCKINGS Wear the elastic stockings on both legs for three weeks following surgery during the day but you may remove then at night for sleeping.  MEDICATIONS See your medication summary on the "After Visit Summary" that the nursing staff will review with you prior to discharge.  You may have some home medications which will be placed on hold until you complete the course of blood thinner medication.  It is important for you to complete the blood thinner medication as prescribed by your surgeon.  Continue your approved medications as instructed at time of discharge.  PRECAUTIONS If you experience chest pain or shortness of breath - call 911 immediately for transfer to the hospital emergency department.  If you develop a fever greater that 101 F, purulent drainage from wound, increased redness or drainage from wound, foul odor from the wound/dressing, or calf pain - CONTACT YOUR SURGEON.                                                   FOLLOW-UP APPOINTMENTS Make sure you keep all of your appointments after your operation with your surgeon and caregivers. You should call the office at the above phone number and make an appointment for approximately two weeks after the date of your surgery or on the date instructed by your surgeon outlined in the "After Visit Summary".  RANGE OF MOTION AND STRENGTHENING EXERCISES  These exercises are designed to help you keep full  movement of your hip joint. Follow your caregiver's or physical therapist's instructions. Perform all exercises about fifteen times, three times per day or as directed. Exercise both hips, even if you have had only one joint replacement. These exercises can be done on a training (exercise) mat, on the floor, on a table or on a bed. Use whatever works the best and is most comfortable for you. Use music or television while you are exercising so that the exercises are a pleasant break in your day. This will make your life better with the exercises acting as a break in routine you can look forward to.  Lying on your back, slowly slide your foot toward your buttocks, raising your knee up off the floor. Then slowly slide your foot back down until your leg is straight again.  Lying on your back spread your legs as far apart as you can without causing discomfort.  Lying on your side, raise your upper leg and foot straight up from the floor as far as is comfortable. Slowly lower the leg and repeat.  Lying on your back, tighten up the muscle in the front of your thigh (quadriceps muscles). You can do this by keeping your leg straight and trying to raise your heel off the floor. This helps strengthen the largest muscle supporting your knee.  Lying on your back, tighten up the muscles of your buttocks both with the legs straight and with the knee bent at a comfortable angle while keeping your heel on the floor.   IF YOU ARE TRANSFERRED TO A SKILLED REHAB FACILITY If the patient is transferred to a skilled rehab facility following release from the hospital, a list of the current medications will be sent to the facility for the patient to continue.  When discharged from the skilled rehab facility, please have the facility set up the patient's [place] prior to being released. Also, the skilled facility will be responsible for providing the patient with their medications at time of release from the  facility to include their pain medication, the muscle relaxants, and their blood thinner medication. If the patient is still at the rehab facility at time of the two week follow up appointment, the skilled rehab facility will also need to assist the patient in arranging follow up appointment in our office and any transportation needs.  MAKE SURE YOU:  Understand these instructions.  Get help right away if you are not doing well or get worse.    Pick up stool softner and laxative for home use following surgery while on pain medications. Do not submerge incision under water. Please use good hand washing techniques while changing dressing each day. May shower starting three days after surgery. Please use a clean towel to pat the incision dry following showers. Continue to use ice for pain and swelling after surgery. Do not use any lotions or creams on the incision until instructed by your surgeon.   Increase activity slowly as tolerated   Complete by: As directed      Allergies as of 07/29/2019      Reactions   Tape Other (See Comments)   ADHESIVE - blistered   Sulfa Antibiotics Itching   Valsartan Other (See Comments)   Not sure - possibly caused stomach upset.   Pravastatin Sodium Nausea Only      Medication List    STOP taking these medications   meloxicam 15 MG tablet Commonly known as: MOBIC     TAKE these medications   amLODipine 10 MG tablet Commonly known as: NORVASC TAKE 1 TABLET BY MOUTH EVERY DAY. NEEDS OFFICE VISIT FOR FURTHER REFILLS. What changed: See the new instructions.   aspirin 325 MG EC tablet Take 1 tablet (325 mg total) by mouth 2 (two) times daily. What changed:   medication strength  how much to take  when to take this   atorvastatin 80 MG tablet Commonly known as: LIPITOR TAKE 1 TABLET(80 MG) BY MOUTH DAILY What changed: See the new instructions.   carvedilol 25 MG tablet Commonly known as: COREG Take 1 tablet (25 mg total) by mouth 2  (two) times daily with a meal.   colesevelam 625 MG tablet Commonly known as: Welchol TAKE 3 TABLETS BY MOUTH  TWICE A DAY WITH A MEAL What changed:   how much to take  how to take this  when to take this  additional instructions   gabapentin 100 MG capsule Commonly known as: NEURONTIN Take 2 capsules (200 mg total) by mouth at bedtime.   HYDROcodone-acetaminophen 7.5-325 MG tablet Commonly known  as: NORCO Take 1 tablet by mouth every 4 (four) hours as needed for severe pain.   linagliptin 5 MG Tabs tablet Commonly known as: Tradjenta Take 1 tablet (5 mg total) by mouth daily.   methocarbamol 500 MG tablet Commonly known as: ROBAXIN Take 1 tablet (500 mg total) by mouth every 6 (six) hours as needed for muscle spasms.   OVER THE COUNTER MEDICATION Take 1 capsule by mouth daily. Suprema Dophilus Supplement - probiotic   pantoprazole 40 MG tablet Commonly known as: PROTONIX TAKE 1 TABLET(40 MG) BY MOUTH DAILY What changed: See the new instructions.   sitaGLIPtin 100 MG tablet Commonly known as: Januvia Take 1 tablet (100 mg total) by mouth daily.   tadalafil 5 MG tablet Commonly known as: CIALIS TAKE 1 TABLET(5 MG) BY MOUTH DAILY What changed: See the new instructions.   terazosin 10 MG capsule Commonly known as: HYTRIN Take 10 mg by mouth at bedtime.   VITAMIN B-12 PO Take 1 tablet by mouth daily.   Vitamin D-3 125 MCG (5000 UT) Tabs Take 5,000 Units by mouth daily.      Follow-up Information    Ollen GrossAluisio, Frank, MD. Schedule an appointment as soon as possible for a visit on 08/10/2019.   Specialty: Orthopedic Surgery Contact information: [address] [place] [id number] [phone]           Signed: Dimitri PedAmber Dava Rensch, PA-C Orthopaedic Surgery 07/30/2019, 9:39 AM

## 2019-08-31 DIAGNOSIS — Z96641 Presence of right artificial hip joint: Secondary | ICD-10-CM | POA: Diagnosis not present

## 2019-09-24 ENCOUNTER — Other Ambulatory Visit: Payer: Self-pay | Admitting: Internal Medicine

## 2019-10-04 ENCOUNTER — Other Ambulatory Visit: Payer: Self-pay | Admitting: Internal Medicine

## 2019-10-11 ENCOUNTER — Other Ambulatory Visit: Payer: Self-pay | Admitting: Internal Medicine

## 2019-10-11 MED ORDER — MELOXICAM 15 MG PO TABS: 15.0000 mg | ORAL_TABLET | Freq: Every day | ORAL | 1 refills | Status: DC | PRN

## 2019-10-26 DIAGNOSIS — Z03818 Encounter for observation for suspected exposure to other biological agents ruled out: Secondary | ICD-10-CM | POA: Diagnosis not present

## 2019-12-30 ENCOUNTER — Other Ambulatory Visit: Payer: Self-pay | Admitting: Internal Medicine

## 2020-01-26 ENCOUNTER — Other Ambulatory Visit: Payer: Self-pay | Admitting: Internal Medicine

## 2020-03-02 ENCOUNTER — Other Ambulatory Visit: Payer: Self-pay | Admitting: Internal Medicine

## 2020-03-28 ENCOUNTER — Ambulatory Visit: Payer: 59 | Admitting: Cardiology

## 2020-03-28 ENCOUNTER — Other Ambulatory Visit: Payer: Self-pay

## 2020-03-28 ENCOUNTER — Encounter: Payer: Self-pay | Admitting: Cardiology

## 2020-03-28 VITALS — BP 120/86 | HR 77 | Ht 69.0 in | Wt 207.0 lb

## 2020-03-28 DIAGNOSIS — E119 Type 2 diabetes mellitus without complications: Secondary | ICD-10-CM

## 2020-03-28 DIAGNOSIS — E785 Hyperlipidemia, unspecified: Secondary | ICD-10-CM

## 2020-03-28 DIAGNOSIS — I2510 Atherosclerotic heart disease of native coronary artery without angina pectoris: Secondary | ICD-10-CM | POA: Diagnosis not present

## 2020-03-28 DIAGNOSIS — I10 Essential (primary) hypertension: Secondary | ICD-10-CM | POA: Diagnosis not present

## 2020-03-28 MED ORDER — ATORVASTATIN CALCIUM 80 MG PO TABS: 80.0000 mg | ORAL_TABLET | Freq: Every day | ORAL | 3 refills | Status: DC

## 2020-03-28 MED ORDER — MELOXICAM 15 MG PO TABS: 15.0000 mg | ORAL_TABLET | Freq: Every day | ORAL | 3 refills | Status: DC | PRN

## 2020-03-28 NOTE — Progress Notes (Signed)
Cardiology Office Note:    Date:  03/28/2020   ID:  Kenlee Maler, DOB 16-Jan-1956, MRN [id number]  PCP:  Tresa Garter, MD  Cardiologist:  Donato Schultz, MD  Electrophysiologist:  None   Referring MD: Tresa Garter, MD     History of Present Illness:    Miquan Tandon is a 64 y.o. male air osteoarthritis of right hip, hip replacement, with known coronary artery disease here for follow-up.  In March 2019, cardiac CT showed abnormal FFR in the mid to distal LAD.  EF normal.  Inferior Q waves.  Mother older sister younger brother all had MIs.  Overall been feeling fairly well.  No anginal symptoms.  Been progressing a little bit slower than expected with his hip replacement.  Past Medical History:  Diagnosis Date  . ALLERGIC RHINITIS   . Chronic headaches    migraines  . Complication of anesthesia    violent vomiting   . Coronary artery disease    non obstructive  . DEGENERATIVE DISC DISEASE   . DIABETES MELLITUS, TYPE II    type 2  . GERD   . History of kidney stones   . HLD (hyperlipidemia)   . HYPERTENSION   . Internal hemorrhoids   . OSTEOARTHRITIS   . PONV (postoperative nausea and vomiting)   . Sleep apnea    no cpap mild case  . THROMBOCYTOPENIA     Past Surgical History:  Procedure Laterality Date  . COLONOSCOPY  10/01/11   internal hemorrhoids  . LEFT HEART CATH AND CORONARY ANGIOGRAPHY N/A 01/07/2018   Procedure: LEFT HEART CATH AND CORONARY ANGIOGRAPHY;  Surgeon: Kathleene Hazel, MD;  Location: [facility];  Service: Cardiovascular;  Laterality: N/A;  . SHOULDER SURGERY     right rotator cuff surg  . TONSILLECTOMY AND ADENOIDECTOMY    . TOTAL HIP ARTHROPLASTY Right 07/28/2019   Procedure: TOTAL HIP ARTHROPLASTY ANTERIOR APPROACH;  Surgeon: Ollen Gross, MD;  Location: [facility];  Service: Orthopedics;  Laterality: Right;   . VASECTOMY      Current Medications: Current Meds  Medication Sig  . amLODipine  (NORVASC) 10 MG tablet TAKE 1 TABLET BY MOUTH EVERY DAY. NEEDS OFFICE VISIT FOR FURTHER REFILLS.  Marland Kitchen aspirin EC 81 MG tablet Take 81 mg by mouth daily.  . carvedilol (COREG) 25 MG tablet Take 1 tablet (25 mg total) by mouth 2 (two) times daily with a meal. Annual appt  W/labs are due must see provider for future refills  . Cholecalciferol (VITAMIN D-3) 125 MCG (5000 UT) TABS Take 5,000 Units by mouth daily.  . colesevelam (WELCHOL) 625 MG tablet TAKE 3 TABLETS BY MOUTH  TWICE A DAY WITH A MEAL  . Cyanocobalamin (VITAMIN B-12 PO) Take 1 tablet by mouth daily.  Marland Kitchen gabapentin (NEURONTIN) 100 MG capsule Take 2 capsules (200 mg total) by mouth at bedtime.  Marland Kitchen HYDROcodone-acetaminophen (NORCO) 7.5-325 MG tablet Take 1 tablet by mouth every 4 (four) hours as needed for severe pain.  Marland Kitchen linagliptin (TRADJENTA) 5 MG TABS tablet Take 1 tablet (5 mg total) by mouth daily.  . methocarbamol (ROBAXIN) 500 MG tablet Take 1 tablet (500 mg total) by mouth every 6 (six) hours as needed for muscle spasms.  Marland Kitchen OVER THE COUNTER MEDICATION Take 1 capsule by mouth daily. Suprema Dophilus Supplement - probiotic  . pantoprazole (PROTONIX) 40 MG tablet TAKE 1 TABLET(40 MG) BY MOUTH DAILY  . sitaGLIPtin (JANUVIA) 100 MG tablet Take 1 tablet (  100 mg total) by mouth daily.  . tadalafil (CIALIS) 5 MG tablet TAKE 1 TABLET(5 MG) BY MOUTH DAILY  . terazosin (HYTRIN) 10 MG capsule Take 10 mg by mouth at bedtime.  . [DISCONTINUED] atorvastatin (LIPITOR) 80 MG tablet TAKE 1 TABLET(80 MG) BY MOUTH DAILY  . [DISCONTINUED] meloxicam (MOBIC) 15 MG tablet Take 1 tablet (15 mg total) by mouth daily as needed for pain.     Allergies:   Tape, Sulfa antibiotics, Valsartan, and Pravastatin sodium   Social History   Socioeconomic History  . Marital status: Married    Spouse name: Not on file  . Number of children: 1  . Years of education: Not on file  . Highest education level: Not on file  Occupational History  . Occupation:  Designer, industrial/product   Tobacco Use  . Smoking status: Never Smoker  . Smokeless tobacco: Never Used  Substance and Sexual Activity  . Alcohol use: No  . Drug use: No  . Sexual activity: Yes  Other Topics Concern  . Not on file  Social History Narrative   Caffeine daily    Social Determinants of Health   Financial Resource Strain:   . Difficulty of Paying Living Expenses:   Food Insecurity:   . Worried About Programme researcher, broadcasting/film/video in the Last Year:   . Barista in the Last Year:   Transportation Needs:   . Freight forwarder (Medical):   Marland Kitchen Lack of Transportation (Non-Medical):   Physical Activity:   . Days of Exercise per Week:   . Minutes of Exercise per Session:   Stress:   . Feeling of Stress :   Social Connections:   . Frequency of Communication with Friends and Family:   . Frequency of Social Gatherings with Friends and Family:   . Attends Religious Services:   . Active Member of Clubs or Organizations:   . Attends Banker Meetings:   Marland Kitchen Marital Status:      Family History: The patient's family history includes Diabetes in his sister. There is no history of Colon cancer.  ROS:   Please see the history of present illness.    No fevers chills nausea vomiting syncope bleeding all other systems reviewed and are negative.  EKGs/Labs/Other Studies Reviewed:    The following studies were reviewed today:  Cath 01/07/18: 1. Moderate non-obstructive CAD 2. There are no flow limiting lesions seen in the RCA, Circumflex or LAD 3. There is a small caliber branch that arises as a septal perforator and then courses to the lateral wall in the distribution of a diagonal branch. There is a severe stenosis in this branch. The branch is too small for PCI/stenting.  4. Normal LV systolic function  Diagnostic Dominance: Right    Recommendations: I would continue medical management of CAD with aggressive risk factor modification. The small diagonal branch could  certainly cause angina. I do not think I would approach this branch with PCI at this time given the size of the vessel.   Vascular ABIs 08/13/2018: No evidence of peripheral vascular disease.   EKG:  EKG is  ordered today.  The ekg ordered today demonstrates sinus rhythm 77 inferior Q waves  Recent Labs: 06/10/2019: TSH 0.93 07/22/2019: ALT 23 07/29/2019: BUN 15; Creatinine, Ser 1.22; Hemoglobin 13.1; Platelets 106; Potassium 4.2; Sodium 138  Recent Lipid Panel    Component Value Date/Time   CHOL 111 01/18/2019 0732   CHOL 169 04/06/2018 0901   TRIG 59.0  01/18/2019 0732   HDL 45.80 01/18/2019 0732   HDL 45 04/06/2018 0901   CHOLHDL 2 01/18/2019 0732   VLDL 11.8 01/18/2019 0732   LDLCALC 54 01/18/2019 0732   LDLCALC 106 (H) 04/06/2018 0901   LDLDIRECT 184.1 10/20/2012 0747    Physical Exam:    VS:  BP 120/86   Pulse 77   Ht 5\' 9"  (1.753 m)   Wt 207 lb (93.9 kg)   SpO2 98%   BMI 30.57 kg/m     Wt Readings from Last 3 Encounters:  03/28/20 207 lb (93.9 kg)  07/28/19 202 lb (91.6 kg)  07/22/19 202 lb (91.6 kg)     GEN:  Well nourished, well developed in no acute distress HEENT: Normal NECK: No JVD; No carotid bruits LYMPHATICS: No lymphadenopathy CARDIAC: RRR, no murmurs, rubs, gallops RESPIRATORY:  Clear to auscultation without rales, wheezing or rhonchi  ABDOMEN: Soft, non-tender, non-distended MUSCULOSKELETAL:  No edema; No deformity  SKIN: Warm and dry NEUROLOGIC:  Alert and oriented x 3 PSYCHIATRIC:  Normal affect   ASSESSMENT:    1. Coronary artery disease involving native coronary artery of native heart without angina pectoris   2. Essential hypertension   3. Diabetes mellitus with coincident hypertension (HCC)   4. Hyperlipidemia, unspecified hyperlipidemia type    PLAN:    In order of problems listed above:  Moderate nonobstructive coronary artery disease -See catheterization above.  Continue with aggressive risk factor prevention. Previously  increased atorvastatin 80 mg.  LDL at goal of less than 70.  Not using isosorbide because of Cialis.  Could consider Ranexa if need be.  For now, continue with current medical management.  Doing well.  Diabetes with hypertension -Continue statin and aspirin secondary prevention.  Last hemoglobin A1c 6.8.  Hyperlipidemia -LDL was 54 in 2020.  Excellent.  No myalgias.  Right hip pain status post replacement.   -He is getting close to 8 months out, he will be doing more walking, more exercise.  Sometimes feels some paresthesias in his leg.   Medication Adjustments/Labs and Tests Ordered: Current medicines are reviewed at length with the patient today.  Concerns regarding medicines are outlined above.  Orders Placed This Encounter  Procedures  . EKG 12-Lead   No orders of the defined types were placed in this encounter.   Patient Instructions  Medication Instructions:  The current medical regimen is effective;  continue present plan and medications.  *If you need a refill on your cardiac medications before your next appointment, please call your pharmacy*  Follow-Up: At [facility], you and your health needs are our priority.  As part of our continuing mission to provide you with exceptional heart care, we have created designated Provider Care Teams.  These Care Teams include your primary Cardiologist (physician) and Advanced Practice Providers (APPs -  Physician Assistants and Nurse Practitioners) who all work together to provide you with the care you need, when you need it.  We recommend signing up for the patient portal called "MyChart".  Sign up information is provided on this After Visit Summary.  MyChart is used to connect with patients for Virtual Visits (Telemedicine).  Patients are able to view lab/test results, encounter notes, upcoming appointments, etc.  Non-urgent messages can be sent to your provider as well.   To learn more about what you can do with MyChart, go to  [facility].    Your next appointment:   12 month(s)  The format for your next appointment:   In  Person  Provider:   Candee Furbish, MD  Thank you for choosing [facility]         Signed, Candee Furbish, MD  03/28/2020 11:44 AM    [place]

## 2020-03-28 NOTE — Patient Instructions (Signed)
Medication Instructions:  The current medical regimen is effective;  continue present plan and medications.  *If you need a refill on your cardiac medications before your next appointment, please call your pharmacy*  Follow-Up: At [facility], you and your health needs are our priority.  As part of our continuing mission to provide you with exceptional heart care, we have created designated Provider Care Teams.  These Care Teams include your primary Cardiologist (physician) and Advanced Practice Providers (APPs -  Physician Assistants and Nurse Practitioners) who all work together to provide you with the care you need, when you need it.  We recommend signing up for the patient portal called "MyChart".  Sign up information is provided on this After Visit Summary.  MyChart is used to connect with patients for Virtual Visits (Telemedicine).  Patients are able to view lab/test results, encounter notes, upcoming appointments, etc.  Non-urgent messages can be sent to your provider as well.   To learn more about what you can do with MyChart, go to [email or web address].    Your next appointment:   12 month(s)  The format for your next appointment:   In Person  Provider:   Mark Skains, MD   Thank you for choosing [facility]!!      

## 2020-03-30 ENCOUNTER — Other Ambulatory Visit: Payer: Self-pay | Admitting: Internal Medicine

## 2020-04-12 ENCOUNTER — Other Ambulatory Visit: Payer: Self-pay | Admitting: Internal Medicine

## 2020-04-13 ENCOUNTER — Other Ambulatory Visit: Payer: Self-pay | Admitting: Internal Medicine

## 2020-04-24 ENCOUNTER — Other Ambulatory Visit: Payer: Self-pay

## 2020-06-06 DIAGNOSIS — D225 Melanocytic nevi of trunk: Secondary | ICD-10-CM | POA: Diagnosis not present

## 2020-06-15 DIAGNOSIS — N400 Enlarged prostate without lower urinary tract symptoms: Secondary | ICD-10-CM | POA: Diagnosis not present

## 2020-06-15 DIAGNOSIS — I10 Essential (primary) hypertension: Secondary | ICD-10-CM | POA: Diagnosis not present

## 2020-06-15 DIAGNOSIS — Z0189 Encounter for other specified special examinations: Secondary | ICD-10-CM | POA: Diagnosis not present

## 2020-06-15 DIAGNOSIS — E119 Type 2 diabetes mellitus without complications: Secondary | ICD-10-CM | POA: Diagnosis not present

## 2020-06-15 DIAGNOSIS — E785 Hyperlipidemia, unspecified: Secondary | ICD-10-CM | POA: Diagnosis not present

## 2020-06-16 ENCOUNTER — Other Ambulatory Visit: Payer: Self-pay | Admitting: Cardiology

## 2020-06-30 ENCOUNTER — Telehealth: Payer: Self-pay | Admitting: Internal Medicine

## 2020-06-30 NOTE — Telephone Encounter (Signed)
Patient's wife called saying that the following medication needs a PA and that the pharmacy had sent over 2 request and have not heard anything.   Wife is requesting that you follow up with the patient once started/Completed.  pantoprazole (PROTONIX) 40 MG tablet

## 2020-07-03 NOTE — Telephone Encounter (Signed)
PA initiated for protonix   Key: [id number]

## 2020-07-06 NOTE — Telephone Encounter (Signed)
Prior authorization has been denied, pt informed

## 2020-07-07 NOTE — Telephone Encounter (Signed)
PA initiated for Pantoprazole  Key: [id number]

## 2020-07-11 DIAGNOSIS — H2513 Age-related nuclear cataract, bilateral: Secondary | ICD-10-CM | POA: Diagnosis not present

## 2020-07-11 DIAGNOSIS — E113292 Type 2 diabetes mellitus with mild nonproliferative diabetic retinopathy without macular edema, left eye: Secondary | ICD-10-CM | POA: Diagnosis not present

## 2020-07-11 DIAGNOSIS — E1136 Type 2 diabetes mellitus with diabetic cataract: Secondary | ICD-10-CM | POA: Diagnosis not present

## 2020-07-11 DIAGNOSIS — H04123 Dry eye syndrome of bilateral lacrimal glands: Secondary | ICD-10-CM | POA: Diagnosis not present

## 2020-07-13 DIAGNOSIS — Z96641 Presence of right artificial hip joint: Secondary | ICD-10-CM | POA: Diagnosis not present

## 2020-07-13 DIAGNOSIS — M7061 Trochanteric bursitis, right hip: Secondary | ICD-10-CM | POA: Diagnosis not present

## 2020-08-11 DIAGNOSIS — M5416 Radiculopathy, lumbar region: Secondary | ICD-10-CM | POA: Diagnosis not present

## 2020-08-11 DIAGNOSIS — M25551 Pain in right hip: Secondary | ICD-10-CM | POA: Diagnosis not present

## 2020-08-25 DIAGNOSIS — Z96641 Presence of right artificial hip joint: Secondary | ICD-10-CM | POA: Diagnosis not present

## 2020-09-04 ENCOUNTER — Other Ambulatory Visit: Payer: Self-pay | Admitting: Internal Medicine

## 2020-10-05 ENCOUNTER — Other Ambulatory Visit: Payer: Self-pay | Admitting: Internal Medicine

## 2020-11-09 ENCOUNTER — Other Ambulatory Visit: Payer: Self-pay | Admitting: Internal Medicine

## 2020-11-13 ENCOUNTER — Other Ambulatory Visit: Payer: Self-pay

## 2020-11-16 MED ORDER — SITAGLIPTIN PHOSPHATE 100 MG PO TABS: 100.0000 mg | ORAL_TABLET | Freq: Every day | ORAL | 0 refills | Status: DC

## 2020-11-17 NOTE — Telephone Encounter (Signed)
Pls advise if ok to do CPX virtual.. pt requesting labs prior.Jacob KitchenRaechel Chute

## 2020-12-07 ENCOUNTER — Telehealth (INDEPENDENT_AMBULATORY_CARE_PROVIDER_SITE_OTHER): Payer: BC Managed Care – PPO | Admitting: Internal Medicine

## 2020-12-07 DIAGNOSIS — R351 Nocturia: Secondary | ICD-10-CM

## 2020-12-07 DIAGNOSIS — I2510 Atherosclerotic heart disease of native coronary artery without angina pectoris: Secondary | ICD-10-CM | POA: Diagnosis not present

## 2020-12-07 DIAGNOSIS — N401 Enlarged prostate with lower urinary tract symptoms: Secondary | ICD-10-CM

## 2020-12-07 DIAGNOSIS — Z0000 Encounter for general adult medical examination without abnormal findings: Secondary | ICD-10-CM

## 2020-12-07 DIAGNOSIS — M1611 Unilateral primary osteoarthritis, right hip: Secondary | ICD-10-CM

## 2020-12-07 DIAGNOSIS — N320 Bladder-neck obstruction: Secondary | ICD-10-CM

## 2020-12-07 DIAGNOSIS — G4452 New daily persistent headache (NDPH): Secondary | ICD-10-CM

## 2020-12-07 DIAGNOSIS — E1121 Type 2 diabetes mellitus with diabetic nephropathy: Secondary | ICD-10-CM | POA: Diagnosis not present

## 2020-12-07 MED ORDER — SITAGLIPTIN PHOSPHATE 100 MG PO TABS: 100.0000 mg | ORAL_TABLET | Freq: Every day | ORAL | 3 refills | Status: DC

## 2020-12-07 MED ORDER — AMOXICILLIN-POT CLAVULANATE 875-125 MG PO TABS: 1.0000 | ORAL_TABLET | Freq: Two times a day (BID) | ORAL | 0 refills | Status: DC

## 2020-12-07 MED ORDER — CARVEDILOL 25 MG PO TABS: 25.0000 mg | ORAL_TABLET | Freq: Two times a day (BID) | ORAL | 3 refills | Status: DC

## 2020-12-07 MED ORDER — FINASTERIDE 5 MG PO TABS: 5.0000 mg | ORAL_TABLET | Freq: Every day | ORAL | 3 refills | Status: DC

## 2020-12-07 MED ORDER — MELOXICAM 15 MG PO TABS: 15.0000 mg | ORAL_TABLET | Freq: Every day | ORAL | 1 refills | Status: DC | PRN

## 2020-12-07 NOTE — Assessment & Plan Note (Addendum)
Worse.  Try Proscar 5 mg daily. Continue with daily Cialis and Hytrin.  Urology referral was offered.

## 2020-12-10 ENCOUNTER — Encounter: Payer: Self-pay | Admitting: Internal Medicine

## 2020-12-10 NOTE — Progress Notes (Signed)
Virtual Visit via Video Note  I connected with Jacob Gordon on 12/10/20 at 10:20 AM EST by a video enabled telemedicine application and verified that I am speaking with the correct person using two identifiers.   I discussed the limitations of evaluation and management by telemedicine and the availability of in person appointments. The patient expressed understanding and agreed to proceed.  I was located at our [place] office. The patient was at home. There was no one else present in the visit.   History of Present Illness: Is complaining of daily headache so 2 weeks duration.  No nausea vomiting, loss of vision or loss of balance.  She is complaining of worsening urinary frequency and urgency.  Is complaining of arthritis.  Follow-up on diabetes   Observations/Objective: The patient appears to be in no acute distress, looks normal.  Assessment and Plan:  See my Assessment and Plan. Follow Up Instructions:    I discussed the assessment and treatment plan with the patient. The patient was provided an opportunity to ask questions and all were answered. The patient agreed with the plan and demonstrated an understanding of the instructions.   The patient was advised to call back or seek an in-person evaluation if the symptoms worsen or if the condition fails to improve as anticipated.  I provided face-to-face time during this encounter. We were at different locations.   Sonda Primes, MD

## 2020-12-10 NOTE — Assessment & Plan Note (Signed)
On Tradjenta.  Obtain hemoglobin A1c, blood chemistry

## 2020-12-10 NOTE — Assessment & Plan Note (Signed)
Unclear etiology.  Sinusitis versus post viral headache versus other.  Empiric Augmentin.  Head CT without contrast.

## 2020-12-10 NOTE — Assessment & Plan Note (Signed)
Worse.  Try Proscar 5 mg daily. Continue with daily Cialis and Hytrin.  Urology referral was offered. 

## 2020-12-10 NOTE — Assessment & Plan Note (Signed)
No angina.  Continue with Coreg, Lipitor, Norvasc

## 2020-12-13 ENCOUNTER — Other Ambulatory Visit (INDEPENDENT_AMBULATORY_CARE_PROVIDER_SITE_OTHER): Payer: BC Managed Care – PPO

## 2020-12-13 DIAGNOSIS — E1121 Type 2 diabetes mellitus with diabetic nephropathy: Secondary | ICD-10-CM | POA: Diagnosis not present

## 2020-12-13 DIAGNOSIS — Z0000 Encounter for general adult medical examination without abnormal findings: Secondary | ICD-10-CM

## 2020-12-13 DIAGNOSIS — Z125 Encounter for screening for malignant neoplasm of prostate: Secondary | ICD-10-CM | POA: Diagnosis not present

## 2020-12-13 LAB — HEMOGLOBIN A1C: Hgb A1c MFr Bld: 6.9 % — ABNORMAL HIGH (ref 4.6–6.5)

## 2020-12-13 LAB — CBC WITH DIFFERENTIAL/PLATELET
Basophils Absolute: 0 10*3/uL (ref 0.0–0.1)
Basophils Relative: 0.5 % (ref 0.0–3.0)
Eosinophils Absolute: 0.1 10*3/uL (ref 0.0–0.7)
Eosinophils Relative: 1.8 % (ref 0.0–5.0)
HCT: 41.9 % (ref 39.0–52.0)
Hemoglobin: 13.8 g/dL (ref 13.0–17.0)
Lymphocytes Relative: 24.7 % (ref 12.0–46.0)
Lymphs Abs: 1.6 10*3/uL (ref 0.7–4.0)
MCHC: 33 g/dL (ref 30.0–36.0)
MCV: 89.2 fl (ref 78.0–100.0)
Monocytes Absolute: 0.7 10*3/uL (ref 0.1–1.0)
Monocytes Relative: 10.9 % (ref 3.0–12.0)
Neutro Abs: 4 10*3/uL (ref 1.4–7.7)
Neutrophils Relative %: 62.1 % (ref 43.0–77.0)
Platelets: 138 10*3/uL — ABNORMAL LOW (ref 150.0–400.0)
RBC: 4.7 Mil/uL (ref 4.22–5.81)
RDW: 16 % — ABNORMAL HIGH (ref 11.5–15.5)
WBC: 6.5 10*3/uL (ref 4.0–10.5)

## 2020-12-13 LAB — URINALYSIS
Bilirubin Urine: NEGATIVE
Hgb urine dipstick: NEGATIVE
Ketones, ur: NEGATIVE
Leukocytes,Ua: NEGATIVE
Nitrite: NEGATIVE
Specific Gravity, Urine: 1.02 (ref 1.000–1.030)
Total Protein, Urine: NEGATIVE
Urine Glucose: NEGATIVE
Urobilinogen, UA: 0.2 (ref 0.0–1.0)
pH: 7 (ref 5.0–8.0)

## 2020-12-13 LAB — COMPREHENSIVE METABOLIC PANEL
ALT: 26 U/L (ref 0–53)
AST: 23 U/L (ref 0–37)
Albumin: 4 g/dL (ref 3.5–5.2)
Alkaline Phosphatase: 85 U/L (ref 39–117)
BUN: 15 mg/dL (ref 6–23)
CO2: 27 mEq/L (ref 19–32)
Calcium: 9.2 mg/dL (ref 8.4–10.5)
Chloride: 107 mEq/L (ref 96–112)
Creatinine, Ser: 1.17 mg/dL (ref 0.40–1.50)
GFR: 66.07 mL/min (ref >60.00)
Glucose, Bld: 115 mg/dL — ABNORMAL HIGH (ref 70–99)
Potassium: 3.8 mEq/L (ref 3.5–5.1)
Sodium: 142 mEq/L (ref 135–145)
Total Bilirubin: 0.6 mg/dL (ref 0.2–1.2)
Total Protein: 6.4 g/dL (ref 6.0–8.3)

## 2020-12-13 LAB — LIPID PANEL
Cholesterol: 146 mg/dL (ref 0–200)
HDL: 39.3 mg/dL (ref >39.00)
LDL Cholesterol: 87 mg/dL (ref 0–99)
NonHDL: 106.9
Total CHOL/HDL Ratio: 4
Triglycerides: 98 mg/dL (ref 0.0–149.0)
VLDL: 19.6 mg/dL (ref 0.0–40.0)

## 2020-12-13 LAB — MICROALBUMIN / CREATININE URINE RATIO
Creatinine,U: 116 mg/dL
Microalb Creat Ratio: 0.8 mg/g (ref 0.0–30.0)
Microalb, Ur: 0.9 mg/dL (ref 0.0–1.9)

## 2020-12-13 LAB — PSA: PSA: 1.13 ng/mL (ref 0.10–4.00)

## 2020-12-13 LAB — TSH: TSH: 1.5 u[IU]/mL (ref 0.35–4.50)

## 2020-12-15 ENCOUNTER — Ambulatory Visit (INDEPENDENT_AMBULATORY_CARE_PROVIDER_SITE_OTHER)
Admission: RE | Admit: 2020-12-15 | Discharge: 2020-12-15 | Disposition: A | Discharge status: Home / Self Care | Payer: BC Managed Care – PPO | Source: Ambulatory Visit | Attending: Internal Medicine | Admitting: Internal Medicine

## 2020-12-15 ENCOUNTER — Other Ambulatory Visit: Payer: Self-pay

## 2020-12-15 DIAGNOSIS — R519 Headache, unspecified: Secondary | ICD-10-CM | POA: Diagnosis not present

## 2020-12-15 DIAGNOSIS — G4452 New daily persistent headache (NDPH): Secondary | ICD-10-CM

## 2021-01-04 ENCOUNTER — Ambulatory Visit: Payer: BC Managed Care – PPO | Admitting: Internal Medicine

## 2021-01-04 ENCOUNTER — Encounter: Payer: Self-pay | Admitting: Internal Medicine

## 2021-01-04 ENCOUNTER — Other Ambulatory Visit: Payer: Self-pay

## 2021-01-04 VITALS — BP 132/82 | HR 86 | Temp 98.8°F | Wt 212.0 lb

## 2021-01-04 DIAGNOSIS — G4452 New daily persistent headache (NDPH): Secondary | ICD-10-CM

## 2021-01-04 DIAGNOSIS — J3089 Other allergic rhinitis: Secondary | ICD-10-CM | POA: Diagnosis not present

## 2021-01-04 DIAGNOSIS — E1121 Type 2 diabetes mellitus with diabetic nephropathy: Secondary | ICD-10-CM

## 2021-01-04 NOTE — Assessment & Plan Note (Signed)
Loratadine daily ENT ref

## 2021-01-04 NOTE — Progress Notes (Signed)
Subjective:  Patient ID: Jacob Gordon, male    DOB: 1956-05-10  Age: 65 y.o. MRN: [id number]  CC: Follow-up (1 month f/u)   HPI Jacob Gordon presents for a HA x 6 wks. Augmentin did not help F/u BPH, HTN  Outpatient Medications Prior to Visit  Medication Sig Dispense Refill  . amLODipine (NORVASC) 10 MG tablet TAKE 1 TABLET BY MOUTH EVERY DAY. NEEDS OFFICE VISIT FOR FURTHER REFILLS. 90 tablet 2  . aspirin EC 81 MG tablet Take 81 mg by mouth daily.    Marland Kitchen atorvastatin (LIPITOR) 80 MG tablet TAKE 1 TABLET(80 MG) BY MOUTH DAILY 90 tablet 2  . carvedilol (COREG) 25 MG tablet Take 1 tablet (25 mg total) by mouth 2 (two) times daily with a meal. Overdue for Annual appt must see provider for future refills 180 tablet 3  . Cholecalciferol (VITAMIN D-3) 125 MCG (5000 UT) TABS Take 5,000 Units by mouth daily.    . colesevelam (WELCHOL) 625 MG tablet TAKE 3 TABLETS BY MOUTH TWICE DAILY WITH A MEAL 180 tablet 3  . Cyanocobalamin (VITAMIN B-12 PO) Take 1 tablet by mouth daily.    . finasteride (PROSCAR) 5 MG tablet Take 1 tablet (5 mg total) by mouth daily. 100 tablet 3  . gabapentin (NEURONTIN) 100 MG capsule Take 2 capsules (200 mg total) by mouth at bedtime. 60 capsule 3  . HYDROcodone-acetaminophen (NORCO) 7.5-325 MG tablet Take 1 tablet by mouth every 4 (four) hours as needed for severe pain. 42 tablet 0  . linagliptin (TRADJENTA) 5 MG TABS tablet Take 1 tablet (5 mg total) by mouth daily. 30 tablet 3  . meloxicam (MOBIC) 15 MG tablet Take 1 tablet (15 mg total) by mouth daily as needed for pain. 90 tablet 1  . methocarbamol (ROBAXIN) 500 MG tablet Take 1 tablet (500 mg total) by mouth every 6 (six) hours as needed for muscle spasms. 40 tablet 0  . OVER THE COUNTER MEDICATION Take 1 capsule by mouth daily. Suprema Dophilus Supplement - probiotic    . pantoprazole (PROTONIX) 40 MG tablet TAKE 1 TABLET BY MOUTH DAILY 30 tablet 11  . sitaGLIPtin (JANUVIA) 100 MG tablet Take 1 tablet  (100 mg total) by mouth daily. Overdue for Annual appt must see provider for future refills 90 tablet 3  . tadalafil (CIALIS) 5 MG tablet TAKE 1 TABLET(5 MG) BY MOUTH DAILY 30 tablet 11  . terazosin (HYTRIN) 10 MG capsule Take 10 mg by mouth at bedtime.    Marland Kitchen amoxicillin-clavulanate (AUGMENTIN) 875-125 MG tablet Take 1 tablet by mouth 2 (two) times daily. (Patient not taking: Reported on 01/04/2021) 20 tablet 0   No facility-administered medications prior to visit.    ROS: Review of Systems  Constitutional: Positive for unexpected weight change. Negative for appetite change and fatigue.  HENT: Negative for congestion, nosebleeds, sneezing, sore throat and trouble swallowing.   Eyes: Negative for itching and visual disturbance.  Respiratory: Negative for cough.   Cardiovascular: Negative for chest pain, palpitations and leg swelling.  Gastrointestinal: Negative for abdominal distention, blood in stool, diarrhea and nausea.  Genitourinary: Negative for frequency and hematuria.  Musculoskeletal: Negative for back pain, gait problem, joint swelling and neck pain.  Skin: Negative for rash.  Neurological: Positive for headaches. Negative for dizziness, tremors, speech difficulty and weakness.  Psychiatric/Behavioral: Negative for agitation, dysphoric mood and sleep disturbance. The patient is not nervous/anxious.     Objective:  BP 132/82 (BP Location: Left Arm)   Pulse  86   Temp 98.8 F (37.1 C) (Oral)   Wt 212 lb (96.2 kg)   SpO2 96%   BMI 31.31 kg/m   BP Readings from Last 3 Encounters:  01/04/21 132/82  03/28/20 120/86  07/29/19 109/64    Wt Readings from Last 3 Encounters:  01/04/21 212 lb (96.2 kg)  03/28/20 207 lb (93.9 kg)  07/28/19 202 lb (91.6 kg)    Physical Exam Constitutional:      General: He is not in acute distress.    Appearance: He is well-developed. He is obese.     Comments: NAD  HENT:     Mouth/Throat:     Mouth: Oropharynx is clear and moist.  Eyes:      Conjunctiva/sclera: Conjunctivae normal.     Pupils: Pupils are equal, round, and reactive to light.  Neck:     Thyroid: No thyromegaly.     Vascular: No JVD.  Cardiovascular:     Rate and Rhythm: Normal rate and regular rhythm.     Pulses: Intact distal pulses.     Heart sounds: Normal heart sounds. No murmur heard. No friction rub. No gallop.   Pulmonary:     Effort: Pulmonary effort is normal. No respiratory distress.     Breath sounds: Normal breath sounds. No wheezing or rales.  Chest:     Chest wall: No tenderness.  Abdominal:     General: Bowel sounds are normal. There is no distension.     Palpations: Abdomen is soft. There is no mass.     Tenderness: There is no abdominal tenderness. There is no guarding or rebound.  Musculoskeletal:        General: No tenderness or edema. Normal range of motion.     Cervical back: Normal range of motion.  Lymphadenopathy:     Cervical: No cervical adenopathy.  Skin:    General: Skin is warm and dry.     Findings: No rash.  Neurological:     Mental Status: He is alert and oriented to person, place, and time.     Cranial Nerves: No cranial nerve deficit.     Motor: No abnormal muscle tone.     Coordination: He displays a negative Romberg sign. Coordination normal.     Gait: Gait normal.     Deep Tendon Reflexes: Reflexes are normal and symmetric.  Psychiatric:        Mood and Affect: Mood and affect normal.        Behavior: Behavior normal.        Thought Content: Thought content normal.        Judgment: Judgment normal.     Lab Results  Component Value Date   WBC 6.5 12/13/2020   HGB 13.8 12/13/2020   HCT 41.9 12/13/2020   PLT 138.0 (L) 12/13/2020   GLUCOSE 115 (H) 12/13/2020   CHOL 146 12/13/2020   TRIG 98.0 12/13/2020   HDL 39.30 12/13/2020   LDLDIRECT 184.1 10/20/2012   LDLCALC 87 12/13/2020   ALT 26 12/13/2020   AST 23 12/13/2020   NA 142 12/13/2020   K 3.8 12/13/2020   CL 107 12/13/2020   CREATININE 1.17  12/13/2020   BUN 15 12/13/2020   CO2 27 12/13/2020   TSH 1.50 12/13/2020   PSA 1.13 12/13/2020   INR 1.2 07/22/2019   HGBA1C 6.9 (H) 12/13/2020   MICROALBUR 0.9 12/13/2020    CT Head Wo Contrast  Result Date: 12/15/2020 CLINICAL DATA:  Headache, intracranial hemorrhage suspected. EXAM:  CT HEAD WITHOUT CONTRAST TECHNIQUE: Contiguous axial images were obtained from the base of the skull through the vertex without intravenous contrast. COMPARISON:  CT head January 31, 2010 FINDINGS: Brain: No evidence of acute infarction, hemorrhage, hydrocephalus, extra-axial collection or mass lesion/mass effect. Mild prominence of the falx is unchanged when compared to the prior and likely related to mineralization. Mild patchy white matter hypoattenuation, likely related to chronic microvascular ischemic disease. Vascular: No hyperdense vessel identified. Skull: No acute fracture. Sinuses/Orbits: Visualized sinuses are clear.  Unremarkable orbits. Other: No mastoid effusion. IMPRESSION: 1. No evidence of acute intracranial abnormality. 2. Mild chronic microvascular ischemic disease. Electronically Signed   By: Feliberto Harts MD   On: 12/15/2020 12:34    Assessment & Plan:    Sonda Primes, MD

## 2021-01-04 NOTE — Assessment & Plan Note (Addendum)
Not better after Augmentin.  ENT referral Consider brain MRI, Neurol ref the patient wants to wait for now

## 2021-01-07 NOTE — Assessment & Plan Note (Signed)
Continue with Tradjenta

## 2021-01-14 ENCOUNTER — Other Ambulatory Visit: Payer: Self-pay | Admitting: Internal Medicine

## 2021-01-14 MED ORDER — RYBELSUS 7 MG PO TABS: 1.0000 | ORAL_TABLET | ORAL | 11 refills | Status: DC

## 2021-01-18 ENCOUNTER — Other Ambulatory Visit: Payer: Self-pay | Admitting: Neurology

## 2021-01-18 ENCOUNTER — Other Ambulatory Visit (HOSPITAL_COMMUNITY): Payer: Self-pay | Admitting: Neurology

## 2021-01-18 DIAGNOSIS — M5441 Lumbago with sciatica, right side: Secondary | ICD-10-CM

## 2021-01-27 ENCOUNTER — Ambulatory Visit (HOSPITAL_COMMUNITY): Payer: BC Managed Care – PPO

## 2021-02-02 ENCOUNTER — Ambulatory Visit (HOSPITAL_COMMUNITY)
Admission: RE | Admit: 2021-02-02 | Discharge: 2021-02-02 | Disposition: A | Discharge status: Home / Self Care | Payer: No Typology Code available for payment source | Source: Ambulatory Visit | Attending: Neurology | Admitting: Neurology

## 2021-02-02 ENCOUNTER — Other Ambulatory Visit: Payer: Self-pay

## 2021-02-02 DIAGNOSIS — M5441 Lumbago with sciatica, right side: Secondary | ICD-10-CM | POA: Diagnosis not present

## 2021-02-05 DIAGNOSIS — K219 Gastro-esophageal reflux disease without esophagitis: Secondary | ICD-10-CM | POA: Diagnosis not present

## 2021-02-05 DIAGNOSIS — J342 Deviated nasal septum: Secondary | ICD-10-CM | POA: Diagnosis not present

## 2021-02-05 DIAGNOSIS — R519 Headache, unspecified: Secondary | ICD-10-CM | POA: Diagnosis not present

## 2021-02-05 DIAGNOSIS — M7631 Iliotibial band syndrome, right leg: Secondary | ICD-10-CM | POA: Diagnosis not present

## 2021-02-05 DIAGNOSIS — R0981 Nasal congestion: Secondary | ICD-10-CM | POA: Insufficient documentation

## 2021-02-05 DIAGNOSIS — Z96641 Presence of right artificial hip joint: Secondary | ICD-10-CM | POA: Diagnosis not present

## 2021-02-05 DIAGNOSIS — J3489 Other specified disorders of nose and nasal sinuses: Secondary | ICD-10-CM | POA: Diagnosis not present

## 2021-02-05 DIAGNOSIS — J321 Chronic frontal sinusitis: Secondary | ICD-10-CM | POA: Diagnosis not present

## 2021-02-05 DIAGNOSIS — J0110 Acute frontal sinusitis, unspecified: Secondary | ICD-10-CM | POA: Diagnosis not present

## 2021-02-14 MED ORDER — RYBELSUS 7 MG PO TABS: 1.0000 | ORAL_TABLET | ORAL | 11 refills | Status: DC

## 2021-02-15 ENCOUNTER — Encounter: Payer: Self-pay | Admitting: Internal Medicine

## 2021-02-15 ENCOUNTER — Other Ambulatory Visit: Payer: Self-pay

## 2021-02-15 ENCOUNTER — Ambulatory Visit (INDEPENDENT_AMBULATORY_CARE_PROVIDER_SITE_OTHER): Payer: No Typology Code available for payment source | Admitting: Internal Medicine

## 2021-02-15 DIAGNOSIS — I10 Essential (primary) hypertension: Secondary | ICD-10-CM

## 2021-02-15 DIAGNOSIS — G44229 Chronic tension-type headache, not intractable: Secondary | ICD-10-CM | POA: Diagnosis not present

## 2021-02-15 DIAGNOSIS — M2550 Pain in unspecified joint: Secondary | ICD-10-CM

## 2021-02-15 DIAGNOSIS — I2510 Atherosclerotic heart disease of native coronary artery without angina pectoris: Secondary | ICD-10-CM | POA: Diagnosis not present

## 2021-02-15 DIAGNOSIS — F5101 Primary insomnia: Secondary | ICD-10-CM

## 2021-02-15 DIAGNOSIS — E119 Type 2 diabetes mellitus without complications: Secondary | ICD-10-CM | POA: Diagnosis not present

## 2021-02-15 NOTE — Assessment & Plan Note (Addendum)
No CP On Coreg, Lipitor, Norvasc, ASA

## 2021-02-15 NOTE — Assessment & Plan Note (Signed)
Valerian root prn 

## 2021-02-15 NOTE — Progress Notes (Signed)
Subjective:  Patient ID: Jacob Gordon, male    DOB: 03-10-1956  Age: 65 y.o. MRN: [id number]  CC: Follow-up (6 week f/u- Need samples on the Rybelus until PA has been approved)   HPI Jacob Gordon presents for LBP, HAs, DM, wt gain  Outpatient Medications Prior to Visit  Medication Sig Dispense Refill  . amLODipine (NORVASC) 10 MG tablet TAKE 1 TABLET BY MOUTH EVERY DAY. NEEDS OFFICE VISIT FOR FURTHER REFILLS. 90 tablet 2  . aspirin EC 81 MG tablet Take 81 mg by mouth daily.    Marland Kitchen atorvastatin (LIPITOR) 80 MG tablet TAKE 1 TABLET(80 MG) BY MOUTH DAILY 90 tablet 2  . carvedilol (COREG) 25 MG tablet Take 1 tablet (25 mg total) by mouth 2 (two) times daily with a meal. Overdue for Annual appt must see provider for future refills 180 tablet 3  . Cholecalciferol (VITAMIN D-3) 125 MCG (5000 UT) TABS Take 5,000 Units by mouth daily.    . colesevelam (WELCHOL) 625 MG tablet TAKE 3 TABLETS BY MOUTH TWICE DAILY WITH A MEAL 180 tablet 3  . Cyanocobalamin (VITAMIN B-12 PO) Take 1 tablet by mouth daily.    . finasteride (PROSCAR) 5 MG tablet Take 1 tablet (5 mg total) by mouth daily. 100 tablet 3  . gabapentin (NEURONTIN) 100 MG capsule Take 2 capsules (200 mg total) by mouth at bedtime. 60 capsule 3  . meloxicam (MOBIC) 15 MG tablet Take 1 tablet (15 mg total) by mouth daily as needed for pain. 90 tablet 1  . methocarbamol (ROBAXIN) 500 MG tablet Take 1 tablet (500 mg total) by mouth every 6 (six) hours as needed for muscle spasms. 40 tablet 0  . OVER THE COUNTER MEDICATION Take 1 capsule by mouth daily. Suprema Dophilus Supplement - probiotic    . pantoprazole (PROTONIX) 40 MG tablet TAKE 1 TABLET BY MOUTH DAILY 30 tablet 11  . Semaglutide (RYBELSUS) 7 MG TABS Take 1 tablet by mouth every morning. 30 tablet 11  . sitaGLIPtin (JANUVIA) 100 MG tablet Take 1 tablet (100 mg total) by mouth daily. Overdue for Annual appt must see provider for future refills 90 tablet 3  . tadalafil (CIALIS)  5 MG tablet TAKE 1 TABLET(5 MG) BY MOUTH DAILY 30 tablet 11  . terazosin (HYTRIN) 10 MG capsule Take 10 mg by mouth at bedtime.     No facility-administered medications prior to visit.    ROS: Review of Systems  Constitutional: Positive for unexpected weight change. Negative for appetite change and fatigue.  HENT: Negative for congestion, nosebleeds, sneezing, sore throat and trouble swallowing.   Eyes: Negative for itching and visual disturbance.  Respiratory: Negative for cough.   Cardiovascular: Negative for chest pain, palpitations and leg swelling.  Gastrointestinal: Negative for abdominal distention, blood in stool, diarrhea and nausea.  Genitourinary: Negative for frequency and hematuria.  Musculoskeletal: Positive for arthralgias, back pain and gait problem. Negative for joint swelling and neck pain.  Skin: Negative for rash.  Neurological: Negative for dizziness, tremors, speech difficulty and weakness.  Psychiatric/Behavioral: Negative for agitation, dysphoric mood and sleep disturbance. The patient is not nervous/anxious.     Objective:  BP 138/90 (BP Location: Left Arm)   Pulse 75   Temp 98.9 F (37.2 C) (Oral)   Ht 5\' 9"  (1.753 m)   Wt 211 lb (95.7 kg)   SpO2 97%   BMI 31.16 kg/m   BP Readings from Last 3 Encounters:  02/15/21 138/90  01/04/21 132/82  03/28/20  120/86    Wt Readings from Last 3 Encounters:  02/15/21 211 lb (95.7 kg)  01/04/21 212 lb (96.2 kg)  03/28/20 207 lb (93.9 kg)    Physical Exam Constitutional:      General: He is not in acute distress.    Appearance: He is well-developed. He is obese.     Comments: NAD  Eyes:     Conjunctiva/sclera: Conjunctivae normal.     Pupils: Pupils are equal, round, and reactive to light.  Neck:     Thyroid: No thyromegaly.     Vascular: No JVD.  Cardiovascular:     Rate and Rhythm: Normal rate and regular rhythm.     Heart sounds: Normal heart sounds. No murmur heard. No friction rub. No gallop.    Pulmonary:     Effort: Pulmonary effort is normal. No respiratory distress.     Breath sounds: Normal breath sounds. No wheezing or rales.  Chest:     Chest wall: No tenderness.  Abdominal:     General: Bowel sounds are normal. There is no distension.     Palpations: Abdomen is soft. There is no mass.     Tenderness: There is no abdominal tenderness. There is no guarding or rebound.  Musculoskeletal:        General: Tenderness present. Normal range of motion.     Cervical back: Normal range of motion.  Lymphadenopathy:     Cervical: No cervical adenopathy.  Skin:    General: Skin is warm and dry.     Findings: No rash.  Neurological:     Mental Status: He is alert and oriented to person, place, and time.     Cranial Nerves: No cranial nerve deficit.     Motor: No abnormal muscle tone.     Coordination: Coordination normal.     Gait: Gait normal.     Deep Tendon Reflexes: Reflexes are normal and symmetric.  Psychiatric:        Behavior: Behavior normal.        Thought Content: Thought content normal.        Judgment: Judgment normal.     Lab Results  Component Value Date   WBC 6.5 12/13/2020   HGB 13.8 12/13/2020   HCT 41.9 12/13/2020   PLT 138.0 (L) 12/13/2020   GLUCOSE 115 (H) 12/13/2020   CHOL 146 12/13/2020   TRIG 98.0 12/13/2020   HDL 39.30 12/13/2020   LDLDIRECT 184.1 10/20/2012   LDLCALC 87 12/13/2020   ALT 26 12/13/2020   AST 23 12/13/2020   NA 142 12/13/2020   K 3.8 12/13/2020   CL 107 12/13/2020   CREATININE 1.17 12/13/2020   BUN 15 12/13/2020   CO2 27 12/13/2020   TSH 1.50 12/13/2020   PSA 1.13 12/13/2020   INR 1.2 07/22/2019   HGBA1C 6.9 (H) 12/13/2020   MICROALBUR 0.9 12/13/2020    MR LUMBAR SPINE WO CONTRAST  Result Date: 02/03/2021 CLINICAL DATA:  Chronic low back pain.  Basketball injury. EXAM: MRI LUMBAR SPINE WITHOUT CONTRAST TECHNIQUE: Multiplanar, multisequence MR imaging of the lumbar spine was performed. No intravenous contrast was  administered. COMPARISON:  None. FINDINGS: Segmentation: 5 non rib-bearing lumbar type vertebral bodies are present. The lowest fully formed vertebral body is L5. Alignment: No significant listhesis is present. Lumbar lordosis preserved. Vertebrae:  Marrow signal and vertebral body heights are normal. Conus medullaris and cauda equina: Conus extends to the T12-L1 level. Conus and cauda equina appear normal. Paraspinal and other soft tissues:  11 mm simple cyst present in the left kidney. Subcentimeter cysts present bilaterally. No solid lesions present. No significant adenopathy. Disc levels: T12-L1: Negative. L1-2: Negative. L2-3: Negative. L3-4: Mild disc bulging is present. Mild facet hypertrophy is noted bilaterally. No significant stenosis is present. L4-5: Disc signal is preserved. Mild bilateral facet hypertrophy noted. No focal protrusion or stenosis. L5-S1: Moderate facet hypertrophy noted bilaterally. Bilateral pars defects present without significant listhesis. IMPRESSION: 1. Moderate facet hypertrophy and bilateral pars defects at L5-S1 without significant listhesis. 2. Mild disc bulging and facet hypertrophy at L3-4 without significant stenosis. 3. Mild bilateral facet hypertrophy at L4-5 without significant stenosis. Electronically Signed   By: Marin Roberts M.D.   On: 02/03/2021 14:04    Assessment & Plan:   Sonda Primes, MD

## 2021-02-15 NOTE — Assessment & Plan Note (Signed)
S/p ENT eval Neurology appt is pending

## 2021-02-15 NOTE — Assessment & Plan Note (Signed)
Vit D In PT On Meloxicam prn

## 2021-02-15 NOTE — Assessment & Plan Note (Signed)
On Amlodipine, Coreg 

## 2021-02-18 ENCOUNTER — Encounter: Payer: Self-pay | Admitting: Internal Medicine

## 2021-02-20 ENCOUNTER — Telehealth: Payer: Self-pay | Admitting: *Deleted

## 2021-02-20 NOTE — Telephone Encounter (Signed)
Check PA status "  This request has received a Favorable outcome from [facility]." Effective from 02/20/2021 through 02/19/2022. Faxed approval to [facility].Marland KitchenRaechel Chute

## 2021-02-20 NOTE — Telephone Encounter (Signed)
Rec'd fax PA for Rybelsus 7 mg. Completed vis cover-my-meds w/ (Key: [id number]) waiting on approval status.Marland KitchenRaechel Chute

## 2021-02-26 ENCOUNTER — Other Ambulatory Visit: Payer: Self-pay

## 2021-02-26 MED ORDER — COLESEVELAM HCL 625 MG PO TABS: ORAL_TABLET | ORAL | 3 refills | Status: DC

## 2021-02-26 NOTE — Telephone Encounter (Signed)
Refilled colesevelam and sent pt message to inform him.

## 2021-02-26 NOTE — Telephone Encounter (Signed)
Refilled colesevelam per pt request. Pt last seen 02/15/21.

## 2021-02-27 DIAGNOSIS — M7631 Iliotibial band syndrome, right leg: Secondary | ICD-10-CM | POA: Diagnosis not present

## 2021-03-06 DIAGNOSIS — M7631 Iliotibial band syndrome, right leg: Secondary | ICD-10-CM | POA: Diagnosis not present

## 2021-03-13 DIAGNOSIS — M7631 Iliotibial band syndrome, right leg: Secondary | ICD-10-CM | POA: Diagnosis not present

## 2021-03-29 DIAGNOSIS — M545 Low back pain, unspecified: Secondary | ICD-10-CM | POA: Diagnosis not present

## 2021-03-29 DIAGNOSIS — Z96641 Presence of right artificial hip joint: Secondary | ICD-10-CM | POA: Diagnosis not present

## 2021-03-29 DIAGNOSIS — M6281 Muscle weakness (generalized): Secondary | ICD-10-CM | POA: Diagnosis not present

## 2021-03-29 DIAGNOSIS — M25551 Pain in right hip: Secondary | ICD-10-CM | POA: Diagnosis not present

## 2021-04-03 DIAGNOSIS — M7918 Myalgia, other site: Secondary | ICD-10-CM | POA: Diagnosis not present

## 2021-04-03 DIAGNOSIS — M25551 Pain in right hip: Secondary | ICD-10-CM | POA: Diagnosis not present

## 2021-04-03 DIAGNOSIS — M6281 Muscle weakness (generalized): Secondary | ICD-10-CM | POA: Diagnosis not present

## 2021-04-12 ENCOUNTER — Other Ambulatory Visit: Payer: Self-pay | Admitting: Cardiology

## 2021-04-17 ENCOUNTER — Other Ambulatory Visit: Payer: Self-pay | Admitting: Internal Medicine

## 2021-04-17 DIAGNOSIS — M7631 Iliotibial band syndrome, right leg: Secondary | ICD-10-CM | POA: Diagnosis not present

## 2021-04-25 DIAGNOSIS — M7631 Iliotibial band syndrome, right leg: Secondary | ICD-10-CM | POA: Diagnosis not present

## 2021-04-26 ENCOUNTER — Ambulatory Visit (INDEPENDENT_AMBULATORY_CARE_PROVIDER_SITE_OTHER): Payer: BC Managed Care – PPO | Admitting: Neurology

## 2021-04-26 ENCOUNTER — Encounter: Payer: Self-pay | Admitting: Neurology

## 2021-04-26 VITALS — BP 140/93 | HR 77 | Ht 69.0 in | Wt 205.0 lb

## 2021-04-26 DIAGNOSIS — G43709 Chronic migraine without aura, not intractable, without status migrainosus: Secondary | ICD-10-CM | POA: Diagnosis not present

## 2021-04-26 DIAGNOSIS — G44229 Chronic tension-type headache, not intractable: Secondary | ICD-10-CM | POA: Diagnosis not present

## 2021-04-26 DIAGNOSIS — G4440 Drug-induced headache, not elsewhere classified, not intractable: Secondary | ICD-10-CM | POA: Diagnosis not present

## 2021-04-26 DIAGNOSIS — T3995XA Adverse effect of unspecified nonopioid analgesic, antipyretic and antirheumatic, initial encounter: Secondary | ICD-10-CM | POA: Insufficient documentation

## 2021-04-26 MED ORDER — UBRELVY 100 MG PO TABS: 100.0000 mg | ORAL_TABLET | ORAL | 11 refills | Status: DC | PRN

## 2021-04-26 MED ORDER — EMGALITY 120 MG/ML ~~LOC~~ SOAJ: SUBCUTANEOUS | 11 refills | Status: DC

## 2021-04-26 NOTE — Progress Notes (Signed)
[facility]    Provider:  Dr Lucia Gaskins Requesting Provider: Serena Colonel, MD Primary Care Provider:  Plotnikov, Georgina Quint, MD  CC:  headache  HPI:  Jacob Gordon is a 65 y.o. male here as requested by Serena Colonel, MD for daily headaches.  Past medical history chronic headaches, coronary artery disease, degenerative disc disease, diabetes, history of kidney stones, hyperlipidemia, hypertension, osteoarthritis, sleep apnea, thrombocytopenia.  I reviewed Dr. Lucky Rathke notes: Patient was seen for headaches, he has a long life history of daily sinus headaches that affected mainly at night, sometimes awaken him up at night, he does suffer with chronic nasal congestion and has a lot of sneezing in the morning, he does not have much anterior nasal discharge but has chronic postnasal drip, he also has a chronic sensation of a glob of something in the back of his throat, he does suffer with heartburn, he is on Nexium but he takes it at nighttime, his only dietary risk factor for reflux is chocolate consumption.  CT was negative for chronic sinus disease, sent for evaluation of possible migraines.   He has had headaches as far back as he can remember as a teenager. No idenfiable triggers or inciting events, he has been seeing ENTs and allergists to see if that is the problem. He has pressure behind the eyes, both eyes, he finds himself holding his nose and pressure points to make it better, more pressure in the temples, massage doesn't help, they are not pulsating or pounding or throbbing more pressure, he has light sensitivity with the headaches, no smell or sound sensitivity, no nausea, it can be severe at times usually mild to moderate. Turning off the lights and not moving helps, movement makes it worse, no FHx of migraines or headaches. He has the headaches daily. It will frequently wake him up in the middle of the night to take excedrin. No other lacrimation or rhinorrhea or cluster-type  headaches. He is not a good sleeper. Taking daily excedrin. More frequent over the years. He remembers the headaches being daily even since HS.   Reviewed notes, labs and imaging from outside physicians, which showed:  I reviewed CT paranasal sinuses without contrast report February 05, 2021 impression: No significant paranasal sinus inflammatory changes.  Patent drainage pathways.  CT head 12/15/2020: IMPRESSION: Personally reviewed images and agree. 1. No evidence of acute intracranial abnormality. 2. Mild chronic microvascular ischemic disease.  Medications tried: carvedilol, meloxicam, gabapentin, amlodipine, cymbalta, metoprolol, rabaxin, nortriptyline, sumatriptan, rizatriptan, zolmitriptan  Review of Systems: Patient complains of symptoms per HPI as well as the following symptoms headache. Pertinent negatives and positives per HPI. All others negative.   Social History   Socioeconomic History   Marital status: Married    Spouse name: Not on file   Number of children: 1   Years of education: Not on file   Highest education level: Not on file  Occupational History   Occupation: Administrative   Tobacco Use   Smoking status: Never   Smokeless tobacco: Never  Vaping Use   Vaping Use: Never used  Substance and Sexual Activity   Alcohol use: No   Drug use: No   Sexual activity: Yes  Other Topics Concern   Not on file  Social History Narrative   Caffeine daily    Social Determinants of Health   Financial Resource Strain: Not on file  Food Insecurity: Not on file  Transportation Needs: Not on file  Physical Activity: Not on file  Stress:  Not on file  Social Connections: Not on file  Intimate Partner Violence: Not on file    Family History  Problem Relation Age of Onset   Diabetes Sister    Colon cancer Neg Hx     Past Medical History:  Diagnosis Date   ALLERGIC RHINITIS    Chronic headaches    migraines   Complication of anesthesia    violent vomiting     Coronary artery disease    non obstructive   DEGENERATIVE DISC DISEASE    DIABETES MELLITUS, TYPE II    type 2   GERD    History of kidney stones    HLD (hyperlipidemia)    HYPERTENSION    Internal hemorrhoids    OSTEOARTHRITIS    PONV (postoperative nausea and vomiting)    Sleep apnea    no cpap mild case   THROMBOCYTOPENIA     Patient Active Problem List   Diagnosis Date Noted   Chronic migraine without aura without status migrainosus, not intractable 04/26/2021   Chronic tension-type headache, not intractable 04/26/2021   Analgesic rebound headache 04/26/2021   OA (osteoarthritis) of hip 07/28/2019   Preop exam for internal medicine 06/10/2019   Piriformis syndrome, right 03/11/2019   Gluteal tendinitis of right buttock 02/04/2019   Arthritis of right hip 02/04/2019   Hemorrhoids 01/15/2019   Discoloration of skin of toe 08/13/2018   Left foot pain 08/13/2018   Right hip pain 08/13/2018   Abnormal cardiac CT angiography    Coronary artery disease involving native coronary artery of native heart without angina pectoris    Angina pectoris (HCC) 01/02/2018   DOE (dyspnea on exertion) 11/26/2017   Chest pressure 11/26/2017   Ingrowing hair 03/17/2017   Abdominal tenderness of left lower quadrant 06/08/2015   Bladder neck obstruction 12/01/2012   Night sweats 09/30/2012   Headache 07/29/2012   Stress at work 07/29/2012   Insomnia 07/29/2012   Loose stools 12/13/2011   Dyspepsia 12/13/2011   Dysphagia 11/13/2011   Nausea 11/13/2011   Well adult exam 07/10/2011   BPH (benign prostatic hyperplasia) 07/10/2011   THROMBOCYTOPENIA 02/06/2010   Myalgia 01/31/2010   Pain in joint 11/29/2008   Diabetes mellitus type 2, controlled (HCC) 05/31/2008   GERD (gastroesophageal reflux disease) 05/31/2008   Arthralgia 05/31/2008   HLD (hyperlipidemia) 03/02/2008   Diabetes mellitus with coincident hypertension (HCC) 03/02/2008   Allergic rhinitis 03/02/2008   ARTHRITIS,  SHOULDER 03/02/2008   DEGENERATIVE DISC DISEASE 03/02/2008    Past Surgical History:  Procedure Laterality Date   COLONOSCOPY  10/01/11   internal hemorrhoids   LEFT HEART CATH AND CORONARY ANGIOGRAPHY N/A 01/07/2018   Procedure: LEFT HEART CATH AND CORONARY ANGIOGRAPHY;  Surgeon: Kathleene Hazel, MD;  Location: [facility];  Service: Cardiovascular;  Laterality: N/A;   SHOULDER SURGERY     right rotator cuff surg   TONSILLECTOMY AND ADENOIDECTOMY     TOTAL HIP ARTHROPLASTY Right 07/28/2019   Procedure: TOTAL HIP ARTHROPLASTY ANTERIOR APPROACH;  Surgeon: Ollen Gross, MD;  Location: [facility];  Service: Orthopedics;  Laterality: Right;    VASECTOMY      Current Outpatient Medications  Medication Sig Dispense Refill   amLODipine (NORVASC) 10 MG tablet TAKE 1 TABLET BY MOUTH EVERY DAY. NEEDS OFFICE VISIT FOR FURTHER REFILLS. 90 tablet 2   aspirin EC 81 MG tablet Take 81 mg by mouth daily.     atorvastatin (LIPITOR) 80 MG tablet TAKE 1 TABLET(80 MG) BY MOUTH DAILY  90 tablet 2   carvedilol (COREG) 25 MG tablet Take 1 tablet (25 mg total) by mouth 2 (two) times daily with a meal. Overdue for Annual appt must see provider for future refills 180 tablet 3   Cholecalciferol (VITAMIN D-3) 125 MCG (5000 UT) TABS Take 5,000 Units by mouth daily.     colesevelam (WELCHOL) 625 MG tablet TAKE 3 TABLETS BY MOUTH TWICE DAILY WITH A MEAL 180 tablet 3   Cyanocobalamin (VITAMIN B-12 PO) Take 1 tablet by mouth daily.     finasteride (PROSCAR) 5 MG tablet Take 1 tablet (5 mg total) by mouth daily. 100 tablet 3   gabapentin (NEURONTIN) 100 MG capsule Take 2 capsules (200 mg total) by mouth at bedtime. 60 capsule 3   Galcanezumab-gnlm (EMGALITY) 120 MG/ML SOAJ 2 injections the first month and then one injection monthly thereafter 4 mL 11   meloxicam (MOBIC) 15 MG tablet Take 1 tablet (15 mg total) by mouth daily as needed for pain. 90 tablet 1   methocarbamol (ROBAXIN) 500 MG tablet Take 1  tablet (500 mg total) by mouth every 6 (six) hours as needed for muscle spasms. 40 tablet 0   OVER THE COUNTER MEDICATION Take 1 capsule by mouth daily. Suprema Dophilus Supplement - probiotic     pantoprazole (PROTONIX) 40 MG tablet TAKE 1 TABLET BY MOUTH DAILY 30 tablet 11   Semaglutide (RYBELSUS) 7 MG TABS Take 1 tablet by mouth every morning. 30 tablet 11   sitaGLIPtin (JANUVIA) 100 MG tablet Take 1 tablet (100 mg total) by mouth daily. Overdue for Annual appt must see provider for future refills 90 tablet 3   tadalafil (CIALIS) 5 MG tablet TAKE 1 TABLET(5 MG) BY MOUTH DAILY 30 tablet 5   terazosin (HYTRIN) 10 MG capsule Take 10 mg by mouth at bedtime.     Ubrogepant (UBRELVY) 100 MG TABS Take 100 mg by mouth every 2 (two) hours as needed. Maximum 200mg  a day. 16 tablet 11   No current facility-administered medications for this visit.    Allergies as of 04/26/2021 - Review Complete 04/26/2021  Allergen Reaction Noted   Tape Other (See Comments) 12/29/2012   Sulfa antibiotics Itching 12/29/2012   Valsartan Other (See Comments)    Pravastatin sodium Nausea Only     Vitals: BP (!) 140/93   Pulse 77   Ht 5\' 9"  (1.753 m)   Wt 205 lb (93 kg)   BMI 30.27 kg/m  Last Weight:  Wt Readings from Last 1 Encounters:  04/26/21 205 lb (93 kg)   Last Height:   Ht Readings from Last 1 Encounters:  04/26/21 5\' 9"  (1.753 m)     Physical exam: Exam: Gen: NAD, conversant, well nourised,  well groomed                     CV: RRR, no MRG. No Carotid Bruits. No peripheral edema, warm, nontender Eyes: Conjunctivae clear without exudates or hemorrhage  Neuro: Detailed Neurologic Exam  Speech:    Speech is normal; fluent and spontaneous with normal comprehension.  Cognition:    The patient is oriented to person, place, and time;     recent and remote memory intact;     language fluent;     normal attention, concentration,     fund of knowledge Cranial Nerves:    The pupils are equal,  round, and reactive to light. The fundi are flat. Visual fields are full to finger confrontation. Extraocular movements are  intact. Trigeminal sensation is intact and the muscles of mastication are normal. The face is symmetric. The palate elevates in the midline. Hearing intact. Voice is normal. Shoulder shrug is normal. The tongue has normal motion without fasciculations.   Coordination:    Normal finger to nose   Gait:    normal.   Motor Observation:    No asymmetry, no atrophy, and no involuntary movements noted. Tone:    Normal muscle tone.    Posture:    Posture is normal. normal erect    Strength:    Strength is V/V in the upper and lower limbs.      Sensation: intact to LT     Reflex Exam:  DTR's:    Deep tendon reflexes in the upper and lower extremities are normal bilaterally. Left biceps is brisker than right however.  Toes:    The toes are downgoing bilaterally.   Clonus:    Clonus is absent.    Assessment/Plan: This is a patient with chronic daily headache unclear etiology.  Symptoms are more in line with chronic tension type along with medication overuse (daily use of Excedrin).  However he has been diagnosed with migraines in the past and cannot rule out atypical migraines or transformed migraines.  -Discussed medication rebound/overuse.  Patient uses Excedrin every day.  Discussed this could be highly contributing to his daily chronic headaches.  Do not take analgesics, over-the-counter medications, triptans more than 10 times in a month.  -Patient has tried multiple classes of medications, at this time I gave him some samples of Emgality; if this is migrainous the Emgality should significantly improve prove his headaches if he reduces his daily Excedrin use.  -For acute management, gave him Ubrelvy samples and prescribed Ubrelvy.  Bernita RaisinUbrelvy does not cause medication rebound/overuse headache.  -I reviewed epic and "care everywhere", I could not find results from  his sleep study.  He apparently has sleep apnea but it was mild in the past.  I cannot find a study to check his oxygen levels.  This could be a component of his headaches and I did recommend a repeat sleep test and treatment for sleep apnea patient declines at this time.  No orders of the defined types were placed in this encounter.  Meds ordered this encounter  Medications   Ubrogepant (UBRELVY) 100 MG TABS    Sig: Take 100 mg by mouth every 2 (two) hours as needed. Maximum 200mg  a day.    Dispense:  16 tablet    Refill:  11    Patient has copay card; she can have medication for $5 regardless of insurance approval or copay amount.   Galcanezumab-gnlm (EMGALITY) 120 MG/ML SOAJ    Sig: 2 injections the first month and then one injection monthly thereafter    Dispense:  4 mL    Refill:  11     Cc: Serena Colonelosen, Jefry, MD,  Plotnikov, Georgina QuintAleksei V, MD  Naomie DeanAntonia Tonye Tancredi, MD  [facility] [address] [place], [place] [id number]  Phone [phone] Fax [phone]  I spent over 80 minutes of face-to-face and non-face-to-face time with patient on the  1. Chronic migraine without aura without status migrainosus, not intractable   2. Chronic tension-type headache, not intractable   3. Analgesic rebound headache    diagnosis.  This included previsit chart review, lab review, study review, order entry, electronic health record documentation, patient education on the different diagnostic and therapeutic options, counseling and coordination of care, risks and  benefits of management, compliance, or risk factor reduction

## 2021-05-01 ENCOUNTER — Telehealth: Payer: Self-pay | Admitting: *Deleted

## 2021-05-01 NOTE — Telephone Encounter (Signed)
Approved effective from 05/01/2021 through 07/23/2021.

## 2021-05-01 NOTE — Telephone Encounter (Signed)
Completed Bernita Raisin PA on Cover My Meds. Key: [id number]. Awaiting determination from [employer].

## 2021-05-02 DIAGNOSIS — M7631 Iliotibial band syndrome, right leg: Secondary | ICD-10-CM | POA: Diagnosis not present

## 2021-05-16 ENCOUNTER — Ambulatory Visit (INDEPENDENT_AMBULATORY_CARE_PROVIDER_SITE_OTHER): Payer: Medicare Other | Admitting: Cardiology

## 2021-05-16 ENCOUNTER — Other Ambulatory Visit: Payer: Self-pay

## 2021-05-16 ENCOUNTER — Encounter: Payer: Self-pay | Admitting: Cardiology

## 2021-05-16 VITALS — BP 112/80 | HR 82 | Ht 69.0 in | Wt 204.0 lb

## 2021-05-16 DIAGNOSIS — R0789 Other chest pain: Secondary | ICD-10-CM

## 2021-05-16 DIAGNOSIS — I2510 Atherosclerotic heart disease of native coronary artery without angina pectoris: Secondary | ICD-10-CM

## 2021-05-16 NOTE — Progress Notes (Signed)
Cardiology Office Note:    Date:  05/16/2021   ID:  Jacob Gordon, DOB 08/13/1956, MRN [id number]  PCP:  Tresa Garter, MD   [facility] Providers Cardiologist:  Donato Schultz, MD     Referring MD: Tresa Garter, MD     History of Present Illness:    Jacob Gordon is a 65 y.o. male here for the follow-up of coronary artery disease.  He has osteoarthritis of the right hip with abnormal FFR in the mid to distal LAD lesion.  His mother and several siblings all of had myocardial infarction. - Overall been feeling quite well without any anginal symptoms.  Feels like a knot in sub xypiod region. Constant. Epigastric tender to push. No trouble with eating. No change with food.   Stopped Lipitor recently.  He states that this may be because he is now on Rybelsus.  I will encourage him to continue with statin therapy.  He does have underlying coronary artery disease.  Past Medical History:  Diagnosis Date   ALLERGIC RHINITIS    Chronic headaches    migraines   Complication of anesthesia    violent vomiting    Coronary artery disease    non obstructive   DEGENERATIVE DISC DISEASE    DIABETES MELLITUS, TYPE II    type 2   GERD    History of kidney stones    HLD (hyperlipidemia)    HYPERTENSION    Internal hemorrhoids    OSTEOARTHRITIS    PONV (postoperative nausea and vomiting)    Sleep apnea    no cpap mild case   THROMBOCYTOPENIA     Past Surgical History:  Procedure Laterality Date   COLONOSCOPY  10/01/11   internal hemorrhoids   LEFT HEART CATH AND CORONARY ANGIOGRAPHY N/A 01/07/2018   Procedure: LEFT HEART CATH AND CORONARY ANGIOGRAPHY;  Surgeon: Kathleene Hazel, MD;  Location: [facility];  Service: Cardiovascular;  Laterality: N/A;   SHOULDER SURGERY     right rotator cuff surg   TONSILLECTOMY AND ADENOIDECTOMY     TOTAL HIP ARTHROPLASTY Right 07/28/2019   Procedure: TOTAL HIP ARTHROPLASTY ANTERIOR APPROACH;  Surgeon:  Ollen Gross, MD;  Location: [facility];  Service: Orthopedics;  Laterality: Right;    VASECTOMY      Current Medications: Current Meds  Medication Sig   amLODipine (NORVASC) 10 MG tablet TAKE 1 TABLET BY MOUTH EVERY DAY. NEEDS OFFICE VISIT FOR FURTHER REFILLS.   aspirin EC 81 MG tablet Take 81 mg by mouth daily.   atorvastatin (LIPITOR) 80 MG tablet TAKE 1 TABLET(80 MG) BY MOUTH DAILY   carvedilol (COREG) 25 MG tablet Take 1 tablet (25 mg total) by mouth 2 (two) times daily with a meal. Overdue for Annual appt must see provider for future refills   Cholecalciferol (VITAMIN D-3) 125 MCG (5000 UT) TABS Take 5,000 Units by mouth daily.   colesevelam (WELCHOL) 625 MG tablet TAKE 3 TABLETS BY MOUTH TWICE DAILY WITH A MEAL   Cyanocobalamin (VITAMIN B-12 PO) Take 1 tablet by mouth daily.   finasteride (PROSCAR) 5 MG tablet Take 1 tablet (5 mg total) by mouth daily.   gabapentin (NEURONTIN) 100 MG capsule Take 2 capsules (200 mg total) by mouth at bedtime.   Galcanezumab-gnlm (EMGALITY) 120 MG/ML SOAJ 2 injections the first month and then one injection monthly thereafter   meloxicam (MOBIC) 15 MG tablet Take 1 tablet (15 mg total) by mouth daily as needed for pain.  methocarbamol (ROBAXIN) 500 MG tablet Take 1 tablet (500 mg total) by mouth every 6 (six) hours as needed for muscle spasms.   OVER THE COUNTER MEDICATION Take 1 capsule by mouth daily. Suprema Dophilus Supplement - probiotic   pantoprazole (PROTONIX) 40 MG tablet TAKE 1 TABLET BY MOUTH DAILY   Semaglutide (RYBELSUS) 7 MG TABS Take 1 tablet by mouth every morning.   sitaGLIPtin (JANUVIA) 100 MG tablet Take 1 tablet (100 mg total) by mouth daily. Overdue for Annual appt must see provider for future refills   tadalafil (CIALIS) 5 MG tablet TAKE 1 TABLET(5 MG) BY MOUTH DAILY   terazosin (HYTRIN) 10 MG capsule Take 10 mg by mouth at bedtime.   Ubrogepant (UBRELVY) 100 MG TABS Take 100 mg by mouth every 2 (two) hours as needed.  Maximum 200mg  a day.     Allergies:   Tape, Sulfa antibiotics, Valsartan, and Pravastatin sodium   Social History   Socioeconomic History   Marital status: Married    Spouse name: Not on file   Number of children: 1   Years of education: Not on file   Highest education level: Not on file  Occupational History   Occupation: Administrative   Tobacco Use   Smoking status: Never   Smokeless tobacco: Never  Vaping Use   Vaping Use: Never used  Substance and Sexual Activity   Alcohol use: No   Drug use: No   Sexual activity: Yes  Other Topics Concern   Not on file  Social History Narrative   Caffeine daily    Social Determinants of Health   Financial Resource Strain: Not on file  Food Insecurity: Not on file  Transportation Needs: Not on file  Physical Activity: Not on file  Stress: Not on file  Social Connections: Not on file     Family History: The patient's family history includes Diabetes in his sister. There is no history of Colon cancer.  ROS:   Please see the history of present illness.     All other systems reviewed and are negative.  EKGs/Labs/Other Studies Reviewed:    The following studies were reviewed today  Cath 01/07/18: 1. Moderate non-obstructive CAD 2. There are no flow limiting lesions seen in the RCA, Circumflex or LAD 3. There is a small caliber branch that arises as a septal perforator and then courses to the lateral wall in the distribution of a diagonal branch. There is a severe stenosis in this branch. The branch is too small for PCI/stenting. 4. Normal LV systolic function   Diagnostic Dominance: Right      Recommendations: I would continue medical management of CAD with aggressive risk factor modification. The small diagonal branch could certainly cause angina. I do not think I would approach this branch with PCI at this time given the size of the vessel.    Vascular ABIs 08/13/2018: No evidence of peripheral vascular disease.     EKG:  EKG is  ordered today.  The ekg ordered today demonstrates sinus rhythm low voltage inferior infarct pattern 78 bpm poor R wave progression noted as well.  Recent Labs: 12/13/2020: ALT 26; BUN 15; Creatinine, Ser 1.17; Hemoglobin 13.8; Platelets 138.0; Potassium 3.8; Sodium 142; TSH 1.50  Recent Lipid Panel    Component Value Date/Time   CHOL 146 12/13/2020 0725   CHOL 169 04/06/2018 0901   TRIG 98.0 12/13/2020 0725   HDL 39.30 12/13/2020 0725   HDL 45 04/06/2018 0901   CHOLHDL 4 12/13/2020 0725  VLDL 19.6 12/13/2020 0725   LDLCALC 87 12/13/2020 0725   LDLCALC 106 (H) 04/06/2018 0901   LDLDIRECT 184.1 10/20/2012 0747     Risk Assessment/Calculations:          Physical Exam:    VS:  BP 112/80 (BP Location: Left Arm, Patient Position: Sitting, Cuff Size: Normal)   Pulse 82   Ht 5\' 9"  (1.753 m)   Wt 204 lb (92.5 kg)   SpO2 98%   BMI 30.13 kg/m     Wt Readings from Last 3 Encounters:  05/16/21 204 lb (92.5 kg)  04/26/21 205 lb (93 kg)  02/15/21 211 lb (95.7 kg)     GEN:  Well nourished, well developed in no acute distress HEENT: Normal NECK: No JVD; No carotid bruits LYMPHATICS: No lymphadenopathy CARDIAC: RRR, no murmurs, rubs, gallops RESPIRATORY:  Clear to auscultation without rales, wheezing or rhonchi  ABDOMEN: Soft, non-tender, non-distended MUSCULOSKELETAL:  No edema; No deformity  SKIN: Warm and dry NEUROLOGIC:  Alert and oriented x 3 PSYCHIATRIC:  Normal affect   ASSESSMENT:    1. Chest discomfort   2. Coronary artery disease involving native coronary artery of native heart without angina pectoris    PLAN:    In order of problems listed above:  Moderate nonobstructive coronary artery disease - Cardiac catheterization reviewed as above.  Continuing with aggressive risk factor prevention.  He is on aspirin 81 mg a day, (encourage use of atorvastatin 80 mg daily), carvedilol 25 mg twice.  -He feels a bony prominence at his subxiphoid region.   I will go ahead and check a chest x-ray PA and lateral for further evaluation. -Last LDL 87.  Goal would be less than 70.  Encouraged use of atorvastatin for stroke and heart attack prevention.   Diabetes with hypertension. - Hemoglobin A1c 6.9, creatinine 1.1 potassium 3.8.  Overall doing quite well. - Continue with amlodipine 10 mg a day.  Refills as needed for medical management.  He is also on carvedilol 25 mg twice a day for blood pressure.  Erectile dysfunction - He is on Cialis.  Daily.  We are avoiding imdur.   1 year follow      Medication Adjustments/Labs and Tests Ordered: Current medicines are reviewed at length with the patient today.  Concerns regarding medicines are outlined above.  Orders Placed This Encounter  Procedures   DG Chest 2 View   No orders of the defined types were placed in this encounter.   Patient Instructions  Medication Instructions:  The current medical regimen is effective;  continue present plan and medications.  *If you need a refill on your cardiac medications before your next appointment, please call your pharmacy*  Testing/Procedures: A chest x-ray takes a picture of the organs and structures inside the chest, including the heart, lungs, and blood vessels. This test can show several things, including, whether the heart is enlarges; whether fluid is building up in the lungs; and whether pacemaker / defibrillator leads are still in place. This will be completed at [facility]. No appointment is needed.   Follow-Up: At [facility], you and your health needs are our priority.  As part of our continuing mission to provide you with exceptional heart care, we have created designated Provider Care Teams.  These Care Teams include your primary Cardiologist (physician) and Advanced Practice Providers (APPs -  Physician Assistants and Nurse Practitioners) who all work together to provide you with the care you need, when you need it.  We  recommend signing up for the patient portal called "MyChart".  Sign up information is provided on this After Visit Summary.  MyChart is used to connect with patients for Virtual Visits (Telemedicine).  Patients are able to view lab/test results, encounter notes, upcoming appointments, etc.  Non-urgent messages can be sent to your provider as well.   To learn more about what you can do with MyChart, go to [email or web address].    Your next appointment:   1 year(s)  The format for your next appointment:   In Person  Provider:   Donato Schultz, MD   Thank you for choosing [facility]!!     Signed, Donato Schultz, MD  05/16/2021 12:10 PM    [facility]

## 2021-05-16 NOTE — Patient Instructions (Signed)
Medication Instructions:  The current medical regimen is effective;  continue present plan and medications.  *If you need a refill on your cardiac medications before your next appointment, please call your pharmacy*  Testing/Procedures: A chest x-ray takes a picture of the organs and structures inside the chest, including the heart, lungs, and blood vessels. This test can show several things, including, whether the heart is enlarges; whether fluid is building up in the lungs; and whether pacemaker / defibrillator leads are still in place. This will be completed at [facility]. No appointment is needed.   Follow-Up: At [facility], you and your health needs are our priority.  As part of our continuing mission to provide you with exceptional heart care, we have created designated Provider Care Teams.  These Care Teams include your primary Cardiologist (physician) and Advanced Practice Providers (APPs -  Physician Assistants and Nurse Practitioners) who all work together to provide you with the care you need, when you need it.  We recommend signing up for the patient portal called "MyChart".  Sign up information is provided on this After Visit Summary.  MyChart is used to connect with patients for Virtual Visits (Telemedicine).  Patients are able to view lab/test results, encounter notes, upcoming appointments, etc.  Non-urgent messages can be sent to your provider as well.   To learn more about what you can do with MyChart, go to [email or web address].    Your next appointment:   1 year(s)  The format for your next appointment:   In Person  Provider:   Donato Schultz, MD   Thank you for choosing [facility]!!

## 2021-05-22 ENCOUNTER — Other Ambulatory Visit: Payer: Self-pay

## 2021-05-22 ENCOUNTER — Ambulatory Visit
Admission: RE | Admit: 2021-05-22 | Discharge: 2021-05-22 | Disposition: A | Discharge status: Home / Self Care | Payer: Medicare Other | Source: Ambulatory Visit | Attending: Cardiology | Admitting: Cardiology

## 2021-05-22 DIAGNOSIS — R0789 Other chest pain: Secondary | ICD-10-CM

## 2021-05-22 DIAGNOSIS — R079 Chest pain, unspecified: Secondary | ICD-10-CM | POA: Diagnosis not present

## 2021-05-28 ENCOUNTER — Telehealth: Payer: Self-pay | Admitting: Cardiology

## 2021-05-28 NOTE — Telephone Encounter (Signed)
-----   Message from Jake Bathe, MD sent at 05/25/2021  5:33 PM EDT ----- Let's try Imdur 30mg  PO QD.  Thanks , MD

## 2021-05-28 NOTE — Telephone Encounter (Signed)
Pt is returning a call  

## 2021-05-28 NOTE — Telephone Encounter (Addendum)
Gave recommendation. Noted patient has Tadalafil from PCP. He does not wish to discontinue Tadalafil. Order not placed for Imdur at this time. Therefore, will route back to Dr. Anne Fu for further advisement.

## 2021-05-29 ENCOUNTER — Other Ambulatory Visit: Payer: Self-pay

## 2021-05-29 DIAGNOSIS — E785 Hyperlipidemia, unspecified: Secondary | ICD-10-CM

## 2021-05-29 MED ORDER — COLESEVELAM HCL 625 MG PO TABS: ORAL_TABLET | ORAL | 3 refills | Status: DC

## 2021-06-18 ENCOUNTER — Ambulatory Visit (INDEPENDENT_AMBULATORY_CARE_PROVIDER_SITE_OTHER): Payer: Medicare Other | Admitting: Internal Medicine

## 2021-06-18 ENCOUNTER — Encounter: Payer: Self-pay | Admitting: Internal Medicine

## 2021-06-18 ENCOUNTER — Other Ambulatory Visit: Payer: Self-pay

## 2021-06-18 DIAGNOSIS — G4440 Drug-induced headache, not elsewhere classified, not intractable: Secondary | ICD-10-CM

## 2021-06-18 DIAGNOSIS — E1121 Type 2 diabetes mellitus with diabetic nephropathy: Secondary | ICD-10-CM

## 2021-06-18 DIAGNOSIS — I159 Secondary hypertension, unspecified: Secondary | ICD-10-CM

## 2021-06-18 DIAGNOSIS — G5701 Lesion of sciatic nerve, right lower limb: Secondary | ICD-10-CM | POA: Diagnosis not present

## 2021-06-18 DIAGNOSIS — T3995XA Adverse effect of unspecified nonopioid analgesic, antipyretic and antirheumatic, initial encounter: Secondary | ICD-10-CM

## 2021-06-18 LAB — COMPREHENSIVE METABOLIC PANEL
ALT: 17 U/L (ref 0–53)
AST: 18 U/L (ref 0–37)
Albumin: 4.3 g/dL (ref 3.5–5.2)
Alkaline Phosphatase: 92 U/L (ref 39–117)
BUN: 16 mg/dL (ref 6–23)
CO2: 28 mEq/L (ref 19–32)
Calcium: 9.5 mg/dL (ref 8.4–10.5)
Chloride: 105 mEq/L (ref 96–112)
Creatinine, Ser: 1.28 mg/dL (ref 0.40–1.50)
GFR: 59.1 mL/min — ABNORMAL LOW (ref >60.00)
Glucose, Bld: 96 mg/dL (ref 70–99)
Potassium: 3.5 mEq/L (ref 3.5–5.1)
Sodium: 140 mEq/L (ref 135–145)
Total Bilirubin: 0.8 mg/dL (ref 0.2–1.2)
Total Protein: 7 g/dL (ref 6.0–8.3)

## 2021-06-18 LAB — HEMOGLOBIN A1C: Hgb A1c MFr Bld: 6.3 % (ref 4.6–6.5)

## 2021-06-18 NOTE — Assessment & Plan Note (Addendum)
In PT now.  It is better now

## 2021-06-18 NOTE — Assessment & Plan Note (Addendum)
Overall better controlled.  Cont on Amlodipine, Coreg for HTN

## 2021-06-18 NOTE — Progress Notes (Signed)
Subjective:  Patient ID: Jacob Gordon, male    DOB: 02-05-1956  Age: 65 y.o. MRN: [id number]  CC: Follow-up (4 month f/u)   HPI Jacob Gordon presents for DM follow-up- on Rybelsus, HTN, HAs  Outpatient Medications Prior to Visit  Medication Sig Dispense Refill   amLODipine (NORVASC) 10 MG tablet TAKE 1 TABLET BY MOUTH EVERY DAY. NEEDS OFFICE VISIT FOR FURTHER REFILLS. 90 tablet 2   aspirin EC 81 MG tablet Take 81 mg by mouth daily.     atorvastatin (LIPITOR) 80 MG tablet TAKE 1 TABLET(80 MG) BY MOUTH DAILY 90 tablet 2   carvedilol (COREG) 25 MG tablet Take 1 tablet (25 mg total) by mouth 2 (two) times daily with a meal. Overdue for Annual appt must see provider for future refills 180 tablet 3   Cholecalciferol (VITAMIN D-3) 125 MCG (5000 UT) TABS Take 5,000 Units by mouth daily.     colesevelam (WELCHOL) 625 MG tablet TAKE 3 TABLETS BY MOUTH TWICE DAILY WITH A MEAL 180 tablet 3   Cyanocobalamin (VITAMIN B-12 PO) Take 1 tablet by mouth daily.     finasteride (PROSCAR) 5 MG tablet Take 1 tablet (5 mg total) by mouth daily. 100 tablet 3   gabapentin (NEURONTIN) 100 MG capsule Take 2 capsules (200 mg total) by mouth at bedtime. 60 capsule 3   Galcanezumab-gnlm (EMGALITY) 120 MG/ML SOAJ 2 injections the first month and then one injection monthly thereafter 4 mL 11   meloxicam (MOBIC) 15 MG tablet Take 1 tablet (15 mg total) by mouth daily as needed for pain. 90 tablet 1   methocarbamol (ROBAXIN) 500 MG tablet Take 1 tablet (500 mg total) by mouth every 6 (six) hours as needed for muscle spasms. 40 tablet 0   OVER THE COUNTER MEDICATION Take 1 capsule by mouth daily. Suprema Dophilus Supplement - probiotic     pantoprazole (PROTONIX) 40 MG tablet TAKE 1 TABLET BY MOUTH DAILY 30 tablet 11   Semaglutide (RYBELSUS) 7 MG TABS Take 1 tablet by mouth every morning. 30 tablet 11   sitaGLIPtin (JANUVIA) 100 MG tablet Take 1 tablet (100 mg total) by mouth daily. Overdue for Annual appt  must see provider for future refills 90 tablet 3   tadalafil (CIALIS) 5 MG tablet TAKE 1 TABLET(5 MG) BY MOUTH DAILY 30 tablet 5   terazosin (HYTRIN) 10 MG capsule Take 10 mg by mouth at bedtime.     Ubrogepant (UBRELVY) 100 MG TABS Take 100 mg by mouth every 2 (two) hours as needed. Maximum 200mg  a day. 16 tablet 11   No facility-administered medications prior to visit.    ROS: Review of Systems  Constitutional:  Negative for appetite change, fatigue and unexpected weight change.  HENT:  Negative for congestion, nosebleeds, sneezing, sore throat and trouble swallowing.   Eyes:  Negative for itching and visual disturbance.  Respiratory:  Negative for cough.   Cardiovascular:  Negative for chest pain, palpitations and leg swelling.  Gastrointestinal:  Negative for abdominal distention, blood in stool, diarrhea and nausea.  Genitourinary:  Negative for frequency and hematuria.  Musculoskeletal:  Positive for back pain. Negative for gait problem, joint swelling and neck pain.  Skin:  Negative for rash.  Neurological:  Positive for headaches. Negative for dizziness, tremors, speech difficulty and weakness.  Psychiatric/Behavioral:  Negative for agitation, dysphoric mood and sleep disturbance. The patient is not nervous/anxious.    Objective:  BP 130/84 (BP Location: Left Arm)   Pulse 80  Temp 98 F (36.7 C) (Oral)   Ht 5\' 9"  (1.753 m)   Wt 200 lb 6.4 oz (90.9 kg)   SpO2 97%   BMI 29.59 kg/m   BP Readings from Last 3 Encounters:  06/18/21 130/84  05/16/21 112/80  04/26/21 (!) 140/93    Wt Readings from Last 3 Encounters:  06/18/21 200 lb 6.4 oz (90.9 kg)  05/16/21 204 lb (92.5 kg)  04/26/21 205 lb (93 kg)    Physical Exam Constitutional:      General: He is not in acute distress.    Appearance: He is well-developed.     Comments: NAD  Eyes:     Conjunctiva/sclera: Conjunctivae normal.     Pupils: Pupils are equal, round, and reactive to light.  Neck:     Thyroid: No  thyromegaly.     Vascular: No JVD.  Cardiovascular:     Rate and Rhythm: Normal rate and regular rhythm.     Heart sounds: Normal heart sounds. No murmur heard.   No friction rub. No gallop.  Pulmonary:     Effort: Pulmonary effort is normal. No respiratory distress.     Breath sounds: Normal breath sounds. No wheezing or rales.  Chest:     Chest wall: No tenderness.  Abdominal:     General: Bowel sounds are normal. There is no distension.     Palpations: Abdomen is soft. There is no mass.     Tenderness: no abdominal tenderness There is no guarding or rebound.  Musculoskeletal:        General: Tenderness present. Normal range of motion.     Cervical back: Normal range of motion.  Lymphadenopathy:     Cervical: No cervical adenopathy.  Skin:    General: Skin is warm and dry.     Findings: No rash.  Neurological:     Mental Status: He is alert and oriented to person, place, and time.     Cranial Nerves: No cranial nerve deficit.     Motor: No abnormal muscle tone.     Coordination: Coordination normal.     Gait: Gait normal.     Deep Tendon Reflexes: Reflexes are normal and symmetric.  Psychiatric:        Behavior: Behavior normal.        Thought Content: Thought content normal.        Judgment: Judgment normal.    Lab Results  Component Value Date   WBC 6.5 12/13/2020   HGB 13.8 12/13/2020   HCT 41.9 12/13/2020   PLT 138.0 (L) 12/13/2020   GLUCOSE 115 (H) 12/13/2020   CHOL 146 12/13/2020   TRIG 98.0 12/13/2020   HDL 39.30 12/13/2020   LDLDIRECT 184.1 10/20/2012   LDLCALC 87 12/13/2020   ALT 26 12/13/2020   AST 23 12/13/2020   NA 142 12/13/2020   K 3.8 12/13/2020   CL 107 12/13/2020   CREATININE 1.17 12/13/2020   BUN 15 12/13/2020   CO2 27 12/13/2020   TSH 1.50 12/13/2020   PSA 1.13 12/13/2020   INR 1.2 07/22/2019   HGBA1C 6.9 (H) 12/13/2020   MICROALBUR 0.9 12/13/2020    DG Chest 2 View  Result Date: 05/22/2021 CLINICAL DATA:  Chest pain for 1 month  EXAM: CHEST - 2 VIEW COMPARISON:  11/26/2017 FINDINGS: The heart size and mediastinal contours are within normal limits. Both lungs are clear. The visualized skeletal structures are unremarkable. IMPRESSION: No active cardiopulmonary disease. Electronically Signed   By: 11/28/2017.D.  On: 05/22/2021 16:00    Assessment & Plan:    Sonda Primes, MD

## 2021-06-18 NOTE — Assessment & Plan Note (Addendum)
  Better. On Ubrelvi and Emgality 120 mg inj monthly-we will continue

## 2021-06-18 NOTE — Assessment & Plan Note (Addendum)
Cont on Rybelsus, Januvia Will check A1c, CMET today

## 2021-07-12 ENCOUNTER — Telehealth: Payer: Self-pay

## 2021-07-12 NOTE — Telephone Encounter (Signed)
I submitted a PA request for Ubrelvy 100mg  on CMM, Key: [id number].   Awaiting determination from [facility]

## 2021-07-16 ENCOUNTER — Encounter: Payer: Self-pay | Admitting: *Deleted

## 2021-07-16 NOTE — Telephone Encounter (Signed)
I have spoken with the patient.  He reports the Bernita Raisin is effective in treating his acute migraines.  He uses it approximately 2 times per week.  He reports that he has completely stopped taking the Excedrin Migraine.  He states medications in the past have given him stomach issues.   Niteria from [employer] called to confirm they have received the appeal via fax and determination will be made within 7 days.  She stated any additional information can be faxed without a cover sheet directly to the same fax number as before.

## 2021-07-16 NOTE — Telephone Encounter (Signed)
We received a fax from [facility]. Unfortunately medicare part D did not approve the [place]. They state the requirement is that patient has tried and failed or cannot take a triptan agent (such as sumatriptan, rizatriptan). They states it is unknown whether or not patient has tried one.   If expedited appeal is desired, fax to [phone]. For standard appeal, fax to [phone].   Chart states pt has tried the following:  sumatriptan, rizatriptan, zolmitriptan.  Will fax appeal.

## 2021-07-16 NOTE — Telephone Encounter (Addendum)
Letter written and faxed to [facility] part D [phone]. along with office note. Received a receipt of confirmation.

## 2021-07-23 NOTE — Telephone Encounter (Signed)
Misty Stanley from [employer] called stating the denial was overturned and there is a authorization on file. Reference#: [id number]

## 2021-07-31 ENCOUNTER — Ambulatory Visit (INDEPENDENT_AMBULATORY_CARE_PROVIDER_SITE_OTHER): Payer: Medicare Other | Admitting: Neurology

## 2021-07-31 ENCOUNTER — Other Ambulatory Visit: Payer: Self-pay

## 2021-07-31 VITALS — BP 140/81 | HR 82 | Ht 69.0 in | Wt 205.2 lb

## 2021-07-31 DIAGNOSIS — G43711 Chronic migraine without aura, intractable, with status migrainosus: Secondary | ICD-10-CM | POA: Diagnosis not present

## 2021-07-31 MED ORDER — EMGALITY 120 MG/ML ~~LOC~~ SOAJ: 120.0000 mg | SUBCUTANEOUS | 11 refills | Status: DC

## 2021-07-31 NOTE — Progress Notes (Signed)
[facility]    Provider:  Dr Lucia Gaskins Requesting Provider: Posey Rea, Georgina Quint, MD Primary Care Provider:  Tresa Garter, MD  CC:  headache  Interval history 07/31/2021: Doing exceptionally well.  Has gone from daily headaches to <8 headaches days a month and many being mild and easily treatable with Bernita Raisin which helps. He has not had a single severe headache since starting the Emgality. When he takes the [place], it takes a few hours and the headache is completely gone, advised to take it right away and take it early. We will continue Emgality.   HPI:  Jacob Gordon is a 65 y.o. male here as requested by Plotnikov, Georgina Quint, MD for daily headaches.  Past medical history chronic headaches, coronary artery disease, degenerative disc disease, diabetes, history of kidney stones, hyperlipidemia, hypertension, osteoarthritis, sleep apnea, thrombocytopenia.  I reviewed Dr. Lucky Rathke notes: Patient was seen for headaches, he has a long life history of daily sinus headaches that affected mainly at night, sometimes awaken him up at night, he does suffer with chronic nasal congestion and has a lot of sneezing in the morning, he does not have much anterior nasal discharge but has chronic postnasal drip, he also has a chronic sensation of a glob of something in the back of his throat, he does suffer with heartburn, he is on Nexium but he takes it at nighttime, his only dietary risk factor for reflux is chocolate consumption.  CT was negative for chronic sinus disease, sent for evaluation of possible migraines.   He has had headaches as far back as he can remember as a teenager. No idenfiable triggers or inciting events, he has been seeing ENTs and allergists to see if that is the problem. He has pressure behind the eyes, both eyes, he finds himself holding his nose and pressure points to make it better, more pressure in the temples, massage doesn't help, they are pulsating or  pounding or throbbing more pressure, he has light sensitivity with the headaches, no smell or sound sensitivity, + nausea, it can be severe at times usually mild to moderate or severe and daily all day for years. Turning off the lights and not moving helps, movement makes it worse, no FHx of migraines or headaches. He has the headaches daily. It will frequently wake him up in the middle of the night to take excedrin. No other lacrimation or rhinorrhea or cluster-type headaches. He is not a good sleeper. Taking daily excedrin. More frequent over the years. He remembers the headaches being daily even since HS. No aura. No other focal neurologic deficits, associated symptoms, inciting events or modifiable factors.   Reviewed notes, labs and imaging from outside physicians, which showed:  I reviewed CT paranasal sinuses without contrast report February 05, 2021 impression: No significant paranasal sinus inflammatory changes.  Patent drainage pathways.  CT head 12/15/2020: IMPRESSION: Personally reviewed images and agree. 1. No evidence of acute intracranial abnormality. 2. Mild chronic microvascular ischemic disease.  Medications tried: carvedilol, meloxicam, gabapentin, amlodipine, cymbalta, metoprolol, rabaxin, nortriptyline, sumatriptan, rizatriptan, zolmitriptan  Review of Systems: Patient complains of symptoms per HPI as well as the following symptoms headache. Pertinent negatives and positives per HPI. All others negative.   Social History   Socioeconomic History   Marital status: Married    Spouse name: Not on file   Number of children: 1   Years of education: Not on file   Highest education level: Not on file  Occupational History   Occupation:  Administrative   Tobacco Use   Smoking status: Never   Smokeless tobacco: Never  Vaping Use   Vaping Use: Never used  Substance and Sexual Activity   Alcohol use: No   Drug use: No   Sexual activity: Yes  Other Topics Concern   Not on file   Social History Narrative   Caffeine daily    Social Determinants of Health   Financial Resource Strain: Not on file  Food Insecurity: Not on file  Transportation Needs: Not on file  Physical Activity: Not on file  Stress: Not on file  Social Connections: Not on file  Intimate Partner Violence: Not on file    Family History  Problem Relation Age of Onset   Diabetes Sister    Colon cancer Neg Hx     Past Medical History:  Diagnosis Date   ALLERGIC RHINITIS    Chronic headaches    migraines   Complication of anesthesia    violent vomiting    Coronary artery disease    non obstructive   DEGENERATIVE DISC DISEASE    DIABETES MELLITUS, TYPE II    type 2   GERD    History of kidney stones    HLD (hyperlipidemia)    HYPERTENSION    Internal hemorrhoids    OSTEOARTHRITIS    PONV (postoperative nausea and vomiting)    Sleep apnea    no cpap mild case   THROMBOCYTOPENIA     Patient Active Problem List   Diagnosis Date Noted   Chronic migraine without aura, with intractable migraine, so stated, with status migrainosus 07/31/2021   Chronic migraine without aura without status migrainosus, not intractable 04/26/2021   Chronic tension-type headache, not intractable 04/26/2021   Analgesic rebound headache 04/26/2021   OA (osteoarthritis) of hip 07/28/2019   Preop exam for internal medicine 06/10/2019   Piriformis syndrome, right 03/11/2019   Gluteal tendinitis of right buttock 02/04/2019   Arthritis of right hip 02/04/2019   Hemorrhoids 01/15/2019   Discoloration of skin of toe 08/13/2018   Left foot pain 08/13/2018   Right hip pain 08/13/2018   Abnormal cardiac CT angiography    Coronary artery disease involving native coronary artery of native heart without angina pectoris    Angina pectoris (HCC) 01/02/2018   DOE (dyspnea on exertion) 11/26/2017   Chest pressure 11/26/2017   Ingrowing hair 03/17/2017   Abdominal tenderness of left lower quadrant 06/08/2015    Bladder neck obstruction 12/01/2012   Night sweats 09/30/2012   Headache 07/29/2012   Stress at work 07/29/2012   Insomnia 07/29/2012   Loose stools 12/13/2011   Dyspepsia 12/13/2011   Dysphagia 11/13/2011   Nausea 11/13/2011   Well adult exam 07/10/2011   BPH (benign prostatic hyperplasia) 07/10/2011   THROMBOCYTOPENIA 02/06/2010   Myalgia 01/31/2010   Pain in joint 11/29/2008   Diabetes mellitus type 2, controlled (HCC) 05/31/2008   GERD (gastroesophageal reflux disease) 05/31/2008   Arthralgia 05/31/2008   HLD (hyperlipidemia) 03/02/2008   HTN (hypertension) 03/02/2008   Allergic rhinitis 03/02/2008   ARTHRITIS, SHOULDER 03/02/2008   DEGENERATIVE DISC DISEASE 03/02/2008    Past Surgical History:  Procedure Laterality Date   COLONOSCOPY  10/01/11   internal hemorrhoids   LEFT HEART CATH AND CORONARY ANGIOGRAPHY N/A 01/07/2018   Procedure: LEFT HEART CATH AND CORONARY ANGIOGRAPHY;  Surgeon: Kathleene Hazel, MD;  Location: [facility] INVASIVE [facility];  Service: Cardiovascular;  Laterality: N/A;   SHOULDER SURGERY     right rotator cuff surg  TONSILLECTOMY AND ADENOIDECTOMY     TOTAL HIP ARTHROPLASTY Right 07/28/2019   Procedure: TOTAL HIP ARTHROPLASTY ANTERIOR APPROACH;  Surgeon: Ollen Gross, MD;  Location: [facility] ORS;  Service: Orthopedics;  Laterality: Right;    VASECTOMY      Current Outpatient Medications  Medication Sig Dispense Refill   amLODipine (NORVASC) 10 MG tablet TAKE 1 TABLET BY MOUTH EVERY DAY. NEEDS OFFICE VISIT FOR FURTHER REFILLS. 90 tablet 2   aspirin EC 81 MG tablet Take 81 mg by mouth daily.     atorvastatin (LIPITOR) 80 MG tablet TAKE 1 TABLET(80 MG) BY MOUTH DAILY 90 tablet 2   carvedilol (COREG) 25 MG tablet Take 1 tablet (25 mg total) by mouth 2 (two) times daily with a meal. Overdue for Annual appt must see provider for future refills 180 tablet 3   Cholecalciferol (VITAMIN D-3) 125 MCG (5000 UT) TABS Take 5,000 Units by mouth daily.      colesevelam (WELCHOL) 625 MG tablet TAKE 3 TABLETS BY MOUTH TWICE DAILY WITH A MEAL 180 tablet 3   Cyanocobalamin (VITAMIN B-12 PO) Take 1 tablet by mouth daily.     finasteride (PROSCAR) 5 MG tablet Take 1 tablet (5 mg total) by mouth daily. 100 tablet 3   gabapentin (NEURONTIN) 100 MG capsule Take 2 capsules (200 mg total) by mouth at bedtime. 60 capsule 3   Galcanezumab-gnlm (EMGALITY) 120 MG/ML SOAJ 2 injections the first month and then one injection monthly thereafter 4 mL 11   Galcanezumab-gnlm (EMGALITY) 120 MG/ML SOAJ Inject 120 mg into the skin every 30 (thirty) days. 1.12 mL 11   meloxicam (MOBIC) 15 MG tablet Take 1 tablet (15 mg total) by mouth daily as needed for pain. 90 tablet 1   methocarbamol (ROBAXIN) 500 MG tablet Take 1 tablet (500 mg total) by mouth every 6 (six) hours as needed for muscle spasms. 40 tablet 0   OVER THE COUNTER MEDICATION Take 1 capsule by mouth daily. Suprema Dophilus Supplement - probiotic     pantoprazole (PROTONIX) 40 MG tablet TAKE 1 TABLET BY MOUTH DAILY 30 tablet 11   Semaglutide (RYBELSUS) 7 MG TABS Take 1 tablet by mouth every morning. 30 tablet 11   sitaGLIPtin (JANUVIA) 100 MG tablet Take 1 tablet (100 mg total) by mouth daily. Overdue for Annual appt must see provider for future refills 90 tablet 3   tadalafil (CIALIS) 5 MG tablet TAKE 1 TABLET(5 MG) BY MOUTH DAILY 30 tablet 5   terazosin (HYTRIN) 10 MG capsule Take 10 mg by mouth at bedtime.     Ubrogepant (UBRELVY) 100 MG TABS Take 100 mg by mouth every 2 (two) hours as needed. Maximum 200mg  a day. 16 tablet 11   No current facility-administered medications for this visit.    Allergies as of 07/31/2021 - Review Complete 07/31/2021  Allergen Reaction Noted   Tape Other (See Comments) 12/29/2012   Sulfa antibiotics Itching 12/29/2012   Valsartan Other (See Comments)    Pravastatin sodium Nausea Only     Vitals: BP 140/81   Pulse 82   Ht 5\' 9"  (1.753 m)   Wt 205 lb 3.2 oz (93.1 kg)    BMI 30.30 kg/m  Last Weight:  Wt Readings from Last 1 Encounters:  07/31/21 205 lb 3.2 oz (93.1 kg)   Last Height:   Ht Readings from Last 1 Encounters:  07/31/21 5\' 9"  (1.753 m)     .Exam: NAD, pleasant  Speech:    Speech is normal; fluent and spontaneous with normal comprehension.  Cognition:    The patient is oriented to person, place, and time;     recent and remote memory intact;     language fluent;    Cranial Nerves:    The pupils are equal, round, and reactive to light.Trigeminal sensation is intact and the muscles of mastication are normal. The face is symmetric. The palate elevates in the midline. Hearing intact. Voice is normal. Shoulder shrug is normal. The tongue has normal motion without fasciculations.   Coordination:  No dysmetria  Motor Observation:    No asymmetry, no atrophy, and no involuntary movements noted. Tone:    Normal muscle tone.     Strength:    Strength is V/V in the upper and lower limbs.      Sensation: intact to LT    Assessment/Plan: This is a patient with Chronic migraine, no more medication overuse, no more excedrin, doing exceptional, was having daily chronic headaches and migraines and now he is extremely happy Emgality has been fantastic!  Doing exceptionally well.  Has gone from daily migraine headaches to <8 headaches days a month and many of those being mild and easily treatable with Bernita Raisin acutely which helps. He has not had a single severe headache since starting the Emgality. When he takes the [place], it takes a few hours and the headache is completely gone, advised to take it right away and take it early. We will continue Emgality.No medication overuse. No aura.  Bernita Raisin approved  - has had sleep studies in the past, doing exceptional, no more significant morning headaches.   - f/u 6 months but if doing well can push to a year  - gave him 2 samples, will order emgality, gave copay card  No orders of  the defined types were placed in this encounter.  Meds ordered this encounter  Medications   Galcanezumab-gnlm (EMGALITY) 120 MG/ML SOAJ    Sig: Inject 120 mg into the skin every 30 (thirty) days.    Dispense:  1.12 mL    Refill:  11    Patient has copay card; he can have medication regardless of insurance approval or copay amount.      Cc: Plotnikov, Georgina Quint, MD,  Plotnikov, Georgina Quint, MD  Naomie Dean, MD  [facility] [address] [place], [place] [address]  Phone [phone] Fax [phone]  I spent 30 minutes of face-to-face and non-face-to-face time with patient on the  1. Chronic migraine without aura, with intractable migraine, so stated, with status migrainosus     diagnosis.  This included previsit chart review, lab review, study review, order entry, electronic health record documentation, patient education on the different diagnostic and therapeutic options, counseling and coordination of care, risks and benefits of management, compliance, or risk factor reduction

## 2021-08-15 DIAGNOSIS — Z23 Encounter for immunization: Secondary | ICD-10-CM | POA: Diagnosis not present

## 2021-08-15 DIAGNOSIS — L821 Other seborrheic keratosis: Secondary | ICD-10-CM | POA: Diagnosis not present

## 2021-08-15 DIAGNOSIS — D485 Neoplasm of uncertain behavior of skin: Secondary | ICD-10-CM | POA: Diagnosis not present

## 2021-08-15 DIAGNOSIS — L918 Other hypertrophic disorders of the skin: Secondary | ICD-10-CM | POA: Diagnosis not present

## 2021-08-15 DIAGNOSIS — D2372 Other benign neoplasm of skin of left lower limb, including hip: Secondary | ICD-10-CM | POA: Diagnosis not present

## 2021-09-04 ENCOUNTER — Telehealth: Payer: Self-pay | Admitting: *Deleted

## 2021-09-04 NOTE — Telephone Encounter (Signed)
Hilario Quar[facility] - Rx #: [id number]  PA sent for Ubrevly waiting on approval. May take up to 72 hours for response

## 2021-09-05 NOTE — Telephone Encounter (Signed)
Approval for ubrelvy already 07-12-2021 to 07-20-22. #16 tablets in 30 days. [employer].

## 2021-09-11 ENCOUNTER — Telehealth: Payer: Self-pay | Admitting: Internal Medicine

## 2021-09-11 NOTE — Telephone Encounter (Signed)
Delice Bison from [facility] calling in  Says she was calling to verify if patient had any contraindications to statin medications  Please call [phone]

## 2021-09-12 NOTE — Telephone Encounter (Signed)
Solicitation call!!!!.. per chart pt is already taking Atorvastatin rx by Dr. Anne Fu...Jacob Gordon

## 2021-09-15 IMAGING — CR DG CHEST 2V
2 series · 2 of 2 positions shown · non-contrast
Comparison: 11/26/2017

CLINICAL DATA: Chest pain for 1 month

EXAM:
CHEST - 2 VIEW

[w chest pa]
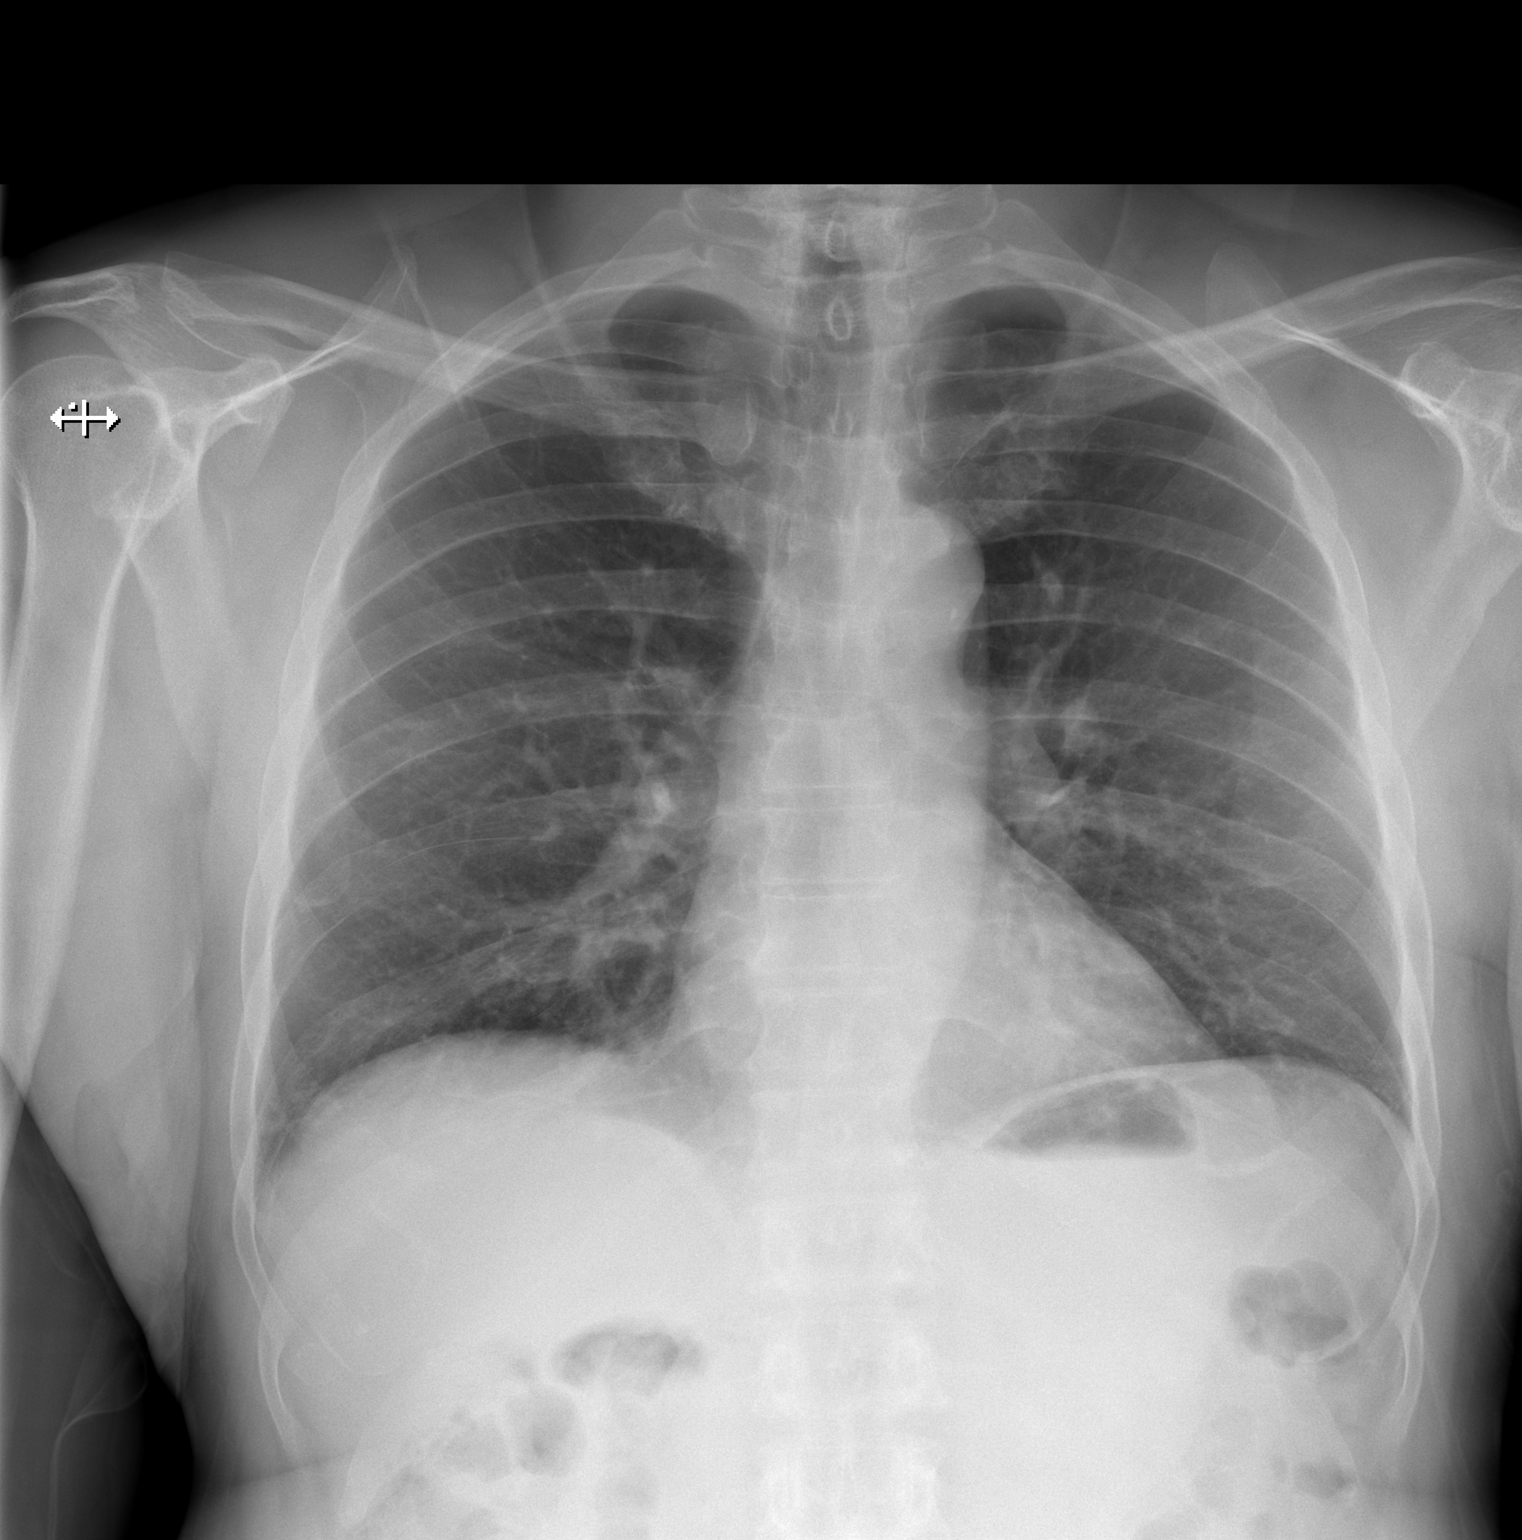

[w chest lat]
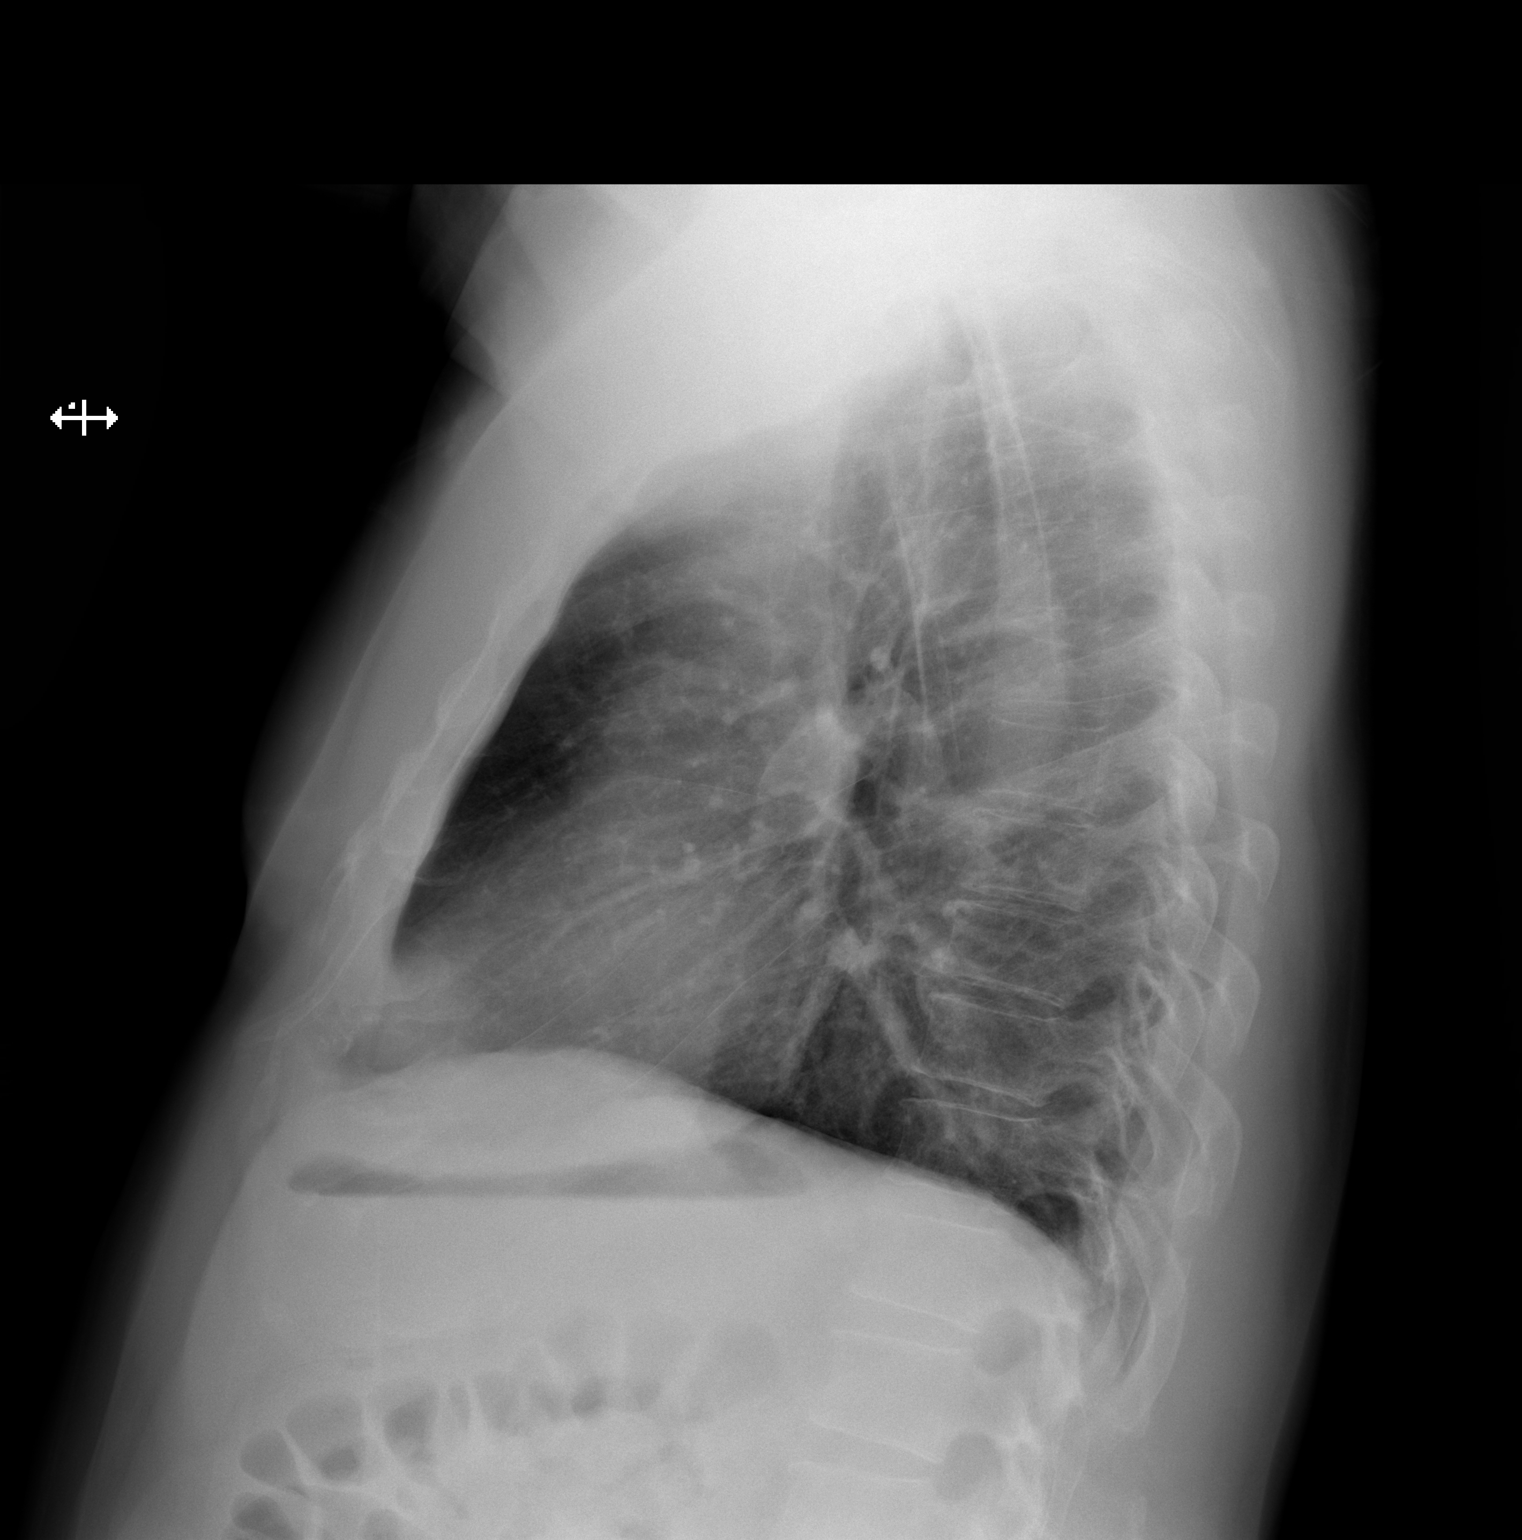

[2 of 2 positions shown; findings below may reference images not displayed]

FINDINGS: The heart size and mediastinal contours are within normal limits.
Both lungs are clear. The visualized skeletal structures are
unremarkable.
IMPRESSION: No active cardiopulmonary disease.

## 2021-09-26 DIAGNOSIS — M25551 Pain in right hip: Secondary | ICD-10-CM | POA: Diagnosis not present

## 2021-10-04 ENCOUNTER — Telehealth: Payer: Self-pay

## 2021-10-04 NOTE — Telephone Encounter (Signed)
Called pt informed him that the [facility] was approved, he did call the pharmacy however they are out and will have to order it.

## 2021-10-04 NOTE — Telephone Encounter (Signed)
Completed Emgality PA on Cover My Meds. Key: [id number]. Approved immediately. Effective from 10/04/2021 through 10/04/2022.

## 2021-10-04 NOTE — Telephone Encounter (Signed)
Spoke with this patient, he was calling to check on a prescription for his emgality. He states that he was supposed to get/take the medication last week but was unable to get it as they are waiting on a prior auth from [place]. Can you please check on this and give the patient a call back to discuss? He did wish to speak with someone regarding this.    Thanks!

## 2021-11-06 ENCOUNTER — Other Ambulatory Visit: Payer: Self-pay | Admitting: Internal Medicine

## 2021-11-10 ENCOUNTER — Encounter: Payer: Self-pay | Admitting: Internal Medicine

## 2021-11-12 ENCOUNTER — Encounter: Payer: Self-pay | Admitting: Cardiology

## 2021-11-12 MED ORDER — ATORVASTATIN CALCIUM 80 MG PO TABS: ORAL_TABLET | ORAL | 1 refills | Status: DC

## 2021-11-27 ENCOUNTER — Ambulatory Visit: Payer: Medicare Other | Admitting: Internal Medicine

## 2021-12-04 ENCOUNTER — Other Ambulatory Visit: Payer: Self-pay

## 2021-12-04 ENCOUNTER — Ambulatory Visit (INDEPENDENT_AMBULATORY_CARE_PROVIDER_SITE_OTHER): Payer: Medicare Other | Admitting: Internal Medicine

## 2021-12-04 ENCOUNTER — Encounter: Payer: Self-pay | Admitting: Internal Medicine

## 2021-12-04 VITALS — BP 130/82 | HR 74 | Temp 98.7°F | Ht 69.0 in | Wt 204.6 lb

## 2021-12-04 DIAGNOSIS — E1121 Type 2 diabetes mellitus with diabetic nephropathy: Secondary | ICD-10-CM | POA: Diagnosis not present

## 2021-12-04 DIAGNOSIS — E785 Hyperlipidemia, unspecified: Secondary | ICD-10-CM

## 2021-12-04 DIAGNOSIS — I159 Secondary hypertension, unspecified: Secondary | ICD-10-CM

## 2021-12-04 DIAGNOSIS — R1012 Left upper quadrant pain: Secondary | ICD-10-CM

## 2021-12-04 DIAGNOSIS — I10 Essential (primary) hypertension: Secondary | ICD-10-CM | POA: Diagnosis not present

## 2021-12-04 DIAGNOSIS — N23 Unspecified renal colic: Secondary | ICD-10-CM | POA: Diagnosis not present

## 2021-12-04 MED ORDER — KETOROLAC TROMETHAMINE 10 MG PO TABS: 10.0000 mg | ORAL_TABLET | Freq: Four times a day (QID) | ORAL | 0 refills | Status: DC | PRN

## 2021-12-04 MED ORDER — HYDROCODONE-ACETAMINOPHEN 5-325 MG PO TABS: 1.0000 | ORAL_TABLET | ORAL | 0 refills | Status: DC | PRN

## 2021-12-04 MED ORDER — FINASTERIDE 5 MG PO TABS: 5.0000 mg | ORAL_TABLET | Freq: Every day | ORAL | 3 refills | Status: DC

## 2021-12-04 MED ORDER — COLESEVELAM HCL 625 MG PO TABS: ORAL_TABLET | ORAL | 3 refills | Status: DC

## 2021-12-04 MED ORDER — MELOXICAM 15 MG PO TABS: 15.0000 mg | ORAL_TABLET | Freq: Every day | ORAL | 1 refills | Status: DC | PRN

## 2021-12-04 MED ORDER — TAMSULOSIN HCL 0.4 MG PO CAPS: 0.4000 mg | ORAL_CAPSULE | Freq: Every day | ORAL | 1 refills | Status: DC | PRN

## 2021-12-04 MED ORDER — TADALAFIL 5 MG PO TABS: ORAL_TABLET | ORAL | 3 refills | Status: DC

## 2021-12-04 NOTE — Progress Notes (Signed)
I will  Subjective:  Patient ID: Jacob Gordon, male    DOB: 16-Oct-1956  Age: 66 y.o. MRN: [id number]  CC: Back Pain (Pt states on the 16th think he passed a stone)   HPI Jacob Gordon presents for a renal colic on the L. He passed a stone in 7 d.  Jacob Gordon had severe pain of the left upper quadrant and in the left flank. Follow-up on diabetes, hypertension  Outpatient Medications Prior to Visit  Medication Sig Dispense Refill   amLODipine (NORVASC) 10 MG tablet TAKE 1 TABLET BY MOUTH EVERY DAY. NEEDS OFFICE VISIT FOR FURTHER REFILLS. 90 tablet 2   aspirin EC 81 MG tablet Take 81 mg by mouth daily.     atorvastatin (LIPITOR) 80 MG tablet TAKE 1 TABLET(80 MG) BY MOUTH DAILY 90 tablet 1   carvedilol (COREG) 25 MG tablet Take 1 tablet (25 mg total) by mouth 2 (two) times daily with a meal. Overdue for Annual appt must see provider for future refills 180 tablet 3   Cholecalciferol (VITAMIN D-3) 125 MCG (5000 UT) TABS Take 5,000 Units by mouth daily.     Galcanezumab-gnlm (EMGALITY) 120 MG/ML SOAJ Inject 120 mg into the skin every 30 (thirty) days. 1.12 mL 11   methocarbamol (ROBAXIN) 500 MG tablet Take 1 tablet (500 mg total) by mouth every 6 (six) hours as needed for muscle spasms. 40 tablet 0   OVER THE COUNTER MEDICATION Take 1 capsule by mouth daily. Suprema Dophilus Supplement - probiotic     pantoprazole (PROTONIX) 40 MG tablet TAKE 1 TABLET BY MOUTH DAILY 30 tablet 11   Semaglutide (RYBELSUS) 7 MG TABS Take 1 tablet by mouth every morning. 30 tablet 11   sitaGLIPtin (JANUVIA) 100 MG tablet Take 1 tablet (100 mg total) by mouth daily. Overdue for Annual appt must see provider for future refills 90 tablet 3   terazosin (HYTRIN) 10 MG capsule Take 10 mg by mouth at bedtime.     Ubrogepant (UBRELVY) 100 MG TABS Take 100 mg by mouth every 2 (two) hours as needed. Maximum 200mg  a day. 16 tablet 11   colesevelam (WELCHOL) 625 MG tablet TAKE 3 TABLETS BY MOUTH TWICE DAILY WITH A MEAL  180 tablet 3   finasteride (PROSCAR) 5 MG tablet Take 1 tablet (5 mg total) by mouth daily. 100 tablet 3   meloxicam (MOBIC) 15 MG tablet Take 1 tablet (15 mg total) by mouth daily as needed for pain. 90 tablet 1   tadalafil (CIALIS) 5 MG tablet TAKE ONE TABLET BY MOUTH DAILY 30 tablet 5   Cyanocobalamin (VITAMIN B-12 PO) Take 1 tablet by mouth daily.     gabapentin (NEURONTIN) 100 MG capsule Take 2 capsules (200 mg total) by mouth at bedtime. 60 capsule 3   Galcanezumab-gnlm (EMGALITY) 120 MG/ML SOAJ 2 injections the first month and then one injection monthly thereafter 4 mL 11   No facility-administered medications prior to visit.    ROS: Review of Systems  Constitutional:  Negative for appetite change, fatigue and unexpected weight change.  HENT:  Negative for congestion, nosebleeds, sneezing, sore throat and trouble swallowing.   Eyes:  Negative for itching and visual disturbance.  Respiratory:  Negative for cough.   Cardiovascular:  Negative for chest pain, palpitations and leg swelling.  Gastrointestinal:  Negative for abdominal distention, blood in stool, diarrhea and nausea.  Genitourinary:  Negative for frequency and hematuria.  Musculoskeletal:  Positive for back pain. Negative for gait problem, joint swelling  and neck pain.  Skin:  Negative for rash.  Neurological:  Negative for dizziness, tremors, speech difficulty and weakness.  Psychiatric/Behavioral:  Negative for agitation, dysphoric mood and sleep disturbance. The patient is not nervous/anxious.    Objective:  BP 130/82 (BP Location: Left Arm)    Pulse 74    Temp 98.7 F (37.1 C) (Oral)    Ht 5\' 9"  (1.753 m)    Wt 204 lb 9.6 oz (92.8 kg)    SpO2 98%    BMI 30.21 kg/m   BP Readings from Last 3 Encounters:  12/04/21 130/82  07/31/21 140/81  06/18/21 130/84    Wt Readings from Last 3 Encounters:  12/04/21 204 lb 9.6 oz (92.8 kg)  07/31/21 205 lb 3.2 oz (93.1 kg)  06/18/21 200 lb 6.4 oz (90.9 kg)    Physical  Exam Constitutional:      General: He is not in acute distress.    Appearance: He is well-developed.     Comments: NAD  Eyes:     Conjunctiva/sclera: Conjunctivae normal.     Pupils: Pupils are equal, round, and reactive to light.  Neck:     Thyroid: No thyromegaly.     Vascular: No JVD.  Cardiovascular:     Rate and Rhythm: Normal rate and regular rhythm.     Heart sounds: Normal heart sounds. No murmur heard.   No friction rub. No gallop.  Pulmonary:     Effort: Pulmonary effort is normal. No respiratory distress.     Breath sounds: Normal breath sounds. No wheezing or rales.  Chest:     Chest wall: No tenderness.  Abdominal:     General: Bowel sounds are normal. There is no distension.     Palpations: Abdomen is soft. There is no mass.     Tenderness: There is no abdominal tenderness. There is no guarding or rebound.  Musculoskeletal:        General: No tenderness. Normal range of motion.     Cervical back: Normal range of motion.  Lymphadenopathy:     Cervical: No cervical adenopathy.  Skin:    General: Skin is warm and dry.     Findings: No rash.  Neurological:     Mental Status: He is alert and oriented to person, place, and time.     Cranial Nerves: No cranial nerve deficit.     Motor: No abnormal muscle tone.     Coordination: Coordination normal.     Gait: Gait normal.     Deep Tendon Reflexes: Reflexes are normal and symmetric.  Psychiatric:        Behavior: Behavior normal.        Thought Content: Thought content normal.        Judgment: Judgment normal.    Lab Results  Component Value Date   WBC 6.2 12/04/2021   HGB 14.1 12/04/2021   HCT 43.1 12/04/2021   PLT 137.0 (L) 12/04/2021   GLUCOSE 101 (H) 12/04/2021   CHOL 146 12/13/2020   TRIG 98.0 12/13/2020   HDL 39.30 12/13/2020   LDLDIRECT 184.1 10/20/2012   LDLCALC 87 12/13/2020   ALT 24 12/04/2021   AST 22 12/04/2021   NA 142 12/04/2021   K 3.9 12/04/2021   CL 106 12/04/2021   CREATININE 1.47  12/04/2021   BUN 17 12/04/2021   CO2 31 12/04/2021   TSH 1.50 12/13/2020   PSA 1.13 12/13/2020   INR 1.2 07/22/2019   HGBA1C 7.0 (H) 12/04/2021   MICROALBUR  0.9 12/13/2020    DG Chest 2 View  Result Date: 05/22/2021 CLINICAL DATA:  Chest pain for 1 month EXAM: CHEST - 2 VIEW COMPARISON:  11/26/2017 FINDINGS: The heart size and mediastinal contours are within normal limits. Both lungs are clear. The visualized skeletal structures are unremarkable. IMPRESSION: No active cardiopulmonary disease. Electronically Signed   By: Alcide Clever M.D.   On: 05/22/2021 16:00    Assessment & Plan:   Problem List Items Addressed This Visit     Diabetes mellitus type 2, controlled (HCC)    Continue on Rybelsus and Januvia.  Monitor A1c      Relevant Orders   Hemoglobin A1c (Completed)   HLD (hyperlipidemia)   Relevant Medications   colesevelam (WELCHOL) 625 MG tablet   tadalafil (CIALIS) 5 MG tablet   HTN (hypertension)    Continue on amlodipine, Coreg      Relevant Medications   colesevelam (WELCHOL) 625 MG tablet   tadalafil (CIALIS) 5 MG tablet   Left upper quadrant abdominal pain - Primary     Will obtain CT abd, renal stone protocol Will obtain UA, CMET and CBC Prescribed Toradol as needed, tamsulosin as needed, Norco as needed for a repeat renal colic      Relevant Orders   CBC with Differential/Platelet (Completed)   Urinalysis (Completed)   Comprehensive metabolic panel (Completed)   CT RENAL ABD W/WO   Renal colic on left side    Recurrent Will obtain CT abd, renal stone protocol Will obtain UA, CMET and CBC Prescribed Toradol as needed, tamsulosin as needed, Norco as needed for a repeat renal colic        Relevant Orders   CBC with Differential/Platelet (Completed)   Urinalysis (Completed)   Comprehensive metabolic panel (Completed)   CT RENAL ABD W/WO      Meds ordered this encounter  Medications   tamsulosin (FLOMAX) 0.4 MG CAPS capsule    Sig: Take 1  capsule (0.4 mg total) by mouth daily as needed. Renal colic    Dispense:  30 capsule    Refill:  1   ketorolac (TORADOL) 10 MG tablet    Sig: Take 1 tablet (10 mg total) by mouth every 6 (six) hours as needed for severe pain or moderate pain (Renal colic).    Dispense:  20 tablet    Refill:  0   HYDROcodone-acetaminophen (NORCO) 5-325 MG tablet    Sig: Take 1 tablet by mouth every 4 (four) hours as needed for severe pain (Renal colic).    Dispense:  20 tablet    Refill:  0   colesevelam (WELCHOL) 625 MG tablet    Sig: TAKE 3 TABLETS BY MOUTH TWICE DAILY WITH A MEAL    Dispense:  540 tablet    Refill:  3   finasteride (PROSCAR) 5 MG tablet    Sig: Take 1 tablet (5 mg total) by mouth daily.    Dispense:  90 tablet    Refill:  3   meloxicam (MOBIC) 15 MG tablet    Sig: Take 1 tablet (15 mg total) by mouth daily as needed for pain.    Dispense:  90 tablet    Refill:  1   tadalafil (CIALIS) 5 MG tablet    Sig: TAKE ONE TABLET BY MOUTH DAILY    Dispense:  90 tablet    Refill:  3      Follow-up: Return in about 4 weeks (around 01/01/2022) for a follow-up visit.  Alex Manmeet Arzola,  MD

## 2021-12-04 NOTE — Assessment & Plan Note (Addendum)
Recurrent Will obtain CT abd, renal stone protocol Will obtain UA, CMET and CBC Prescribed Toradol as needed, tamsulosin as needed, Norco as needed for a repeat renal colic

## 2021-12-05 LAB — CBC WITH DIFFERENTIAL/PLATELET
Basophils Absolute: 0 10*3/uL (ref 0.0–0.1)
Basophils Relative: 0.4 % (ref 0.0–3.0)
Eosinophils Absolute: 0.1 10*3/uL (ref 0.0–0.7)
Eosinophils Relative: 1.8 % (ref 0.0–5.0)
HCT: 43.1 % (ref 39.0–52.0)
Hemoglobin: 14.1 g/dL (ref 13.0–17.0)
Lymphocytes Relative: 25.9 % (ref 12.0–46.0)
Lymphs Abs: 1.6 10*3/uL (ref 0.7–4.0)
MCHC: 32.8 g/dL (ref 30.0–36.0)
MCV: 88.5 fl (ref 78.0–100.0)
Monocytes Absolute: 0.7 10*3/uL (ref 0.1–1.0)
Monocytes Relative: 11.5 % (ref 3.0–12.0)
Neutro Abs: 3.7 10*3/uL (ref 1.4–7.7)
Neutrophils Relative %: 60.4 % (ref 43.0–77.0)
Platelets: 137 10*3/uL — ABNORMAL LOW (ref 150.0–400.0)
RBC: 4.88 Mil/uL (ref 4.22–5.81)
RDW: 16.1 % — ABNORMAL HIGH (ref 11.5–15.5)
WBC: 6.2 10*3/uL (ref 4.0–10.5)

## 2021-12-05 LAB — COMPREHENSIVE METABOLIC PANEL
ALT: 24 U/L (ref 0–53)
AST: 22 U/L (ref 0–37)
Albumin: 4.2 g/dL (ref 3.5–5.2)
Alkaline Phosphatase: 95 U/L (ref 39–117)
BUN: 17 mg/dL (ref 6–23)
CO2: 31 mEq/L (ref 19–32)
Calcium: 9.3 mg/dL (ref 8.4–10.5)
Chloride: 106 mEq/L (ref 96–112)
Creatinine, Ser: 1.47 mg/dL (ref 0.40–1.50)
GFR: 49.89 mL/min — ABNORMAL LOW (ref >60.00)
Glucose, Bld: 101 mg/dL — ABNORMAL HIGH (ref 70–99)
Potassium: 3.9 mEq/L (ref 3.5–5.1)
Sodium: 142 mEq/L (ref 135–145)
Total Bilirubin: 0.6 mg/dL (ref 0.2–1.2)
Total Protein: 7.1 g/dL (ref 6.0–8.3)

## 2021-12-05 LAB — HEMOGLOBIN A1C: Hgb A1c MFr Bld: 7 % — ABNORMAL HIGH (ref 4.6–6.5)

## 2021-12-05 LAB — URINALYSIS
Bilirubin Urine: NEGATIVE
Hgb urine dipstick: NEGATIVE
Leukocytes,Ua: NEGATIVE
Nitrite: NEGATIVE
Specific Gravity, Urine: >=1.03 — AB (ref 1.000–1.030)
Urine Glucose: NEGATIVE
Urobilinogen, UA: 0.2 (ref 0.0–1.0)
pH: 6 (ref 5.0–8.0)

## 2021-12-06 ENCOUNTER — Telehealth: Payer: Self-pay

## 2021-12-06 ENCOUNTER — Encounter: Payer: Self-pay | Admitting: Gastroenterology

## 2021-12-06 NOTE — Telephone Encounter (Signed)
Ketorolac approval 10 mg   Key number: [id number]   [employer] approval for 1 year

## 2021-12-07 ENCOUNTER — Encounter: Payer: Self-pay | Admitting: Internal Medicine

## 2021-12-10 DIAGNOSIS — R1012 Left upper quadrant pain: Secondary | ICD-10-CM | POA: Insufficient documentation

## 2021-12-10 NOTE — Assessment & Plan Note (Signed)
°  Will obtain CT abd, renal stone protocol Will obtain UA, CMET and CBC Prescribed Toradol as needed, tamsulosin as needed, Norco as needed for a repeat renal colic

## 2021-12-10 NOTE — Assessment & Plan Note (Signed)
Continue on Rybelsus and Januvia.  Monitor A1c

## 2021-12-10 NOTE — Assessment & Plan Note (Signed)
Continue on amlodipine, Coreg

## 2021-12-11 ENCOUNTER — Other Ambulatory Visit: Payer: Self-pay | Admitting: Internal Medicine

## 2021-12-12 ENCOUNTER — Encounter: Payer: Self-pay | Admitting: Internal Medicine

## 2021-12-13 ENCOUNTER — Telehealth: Payer: Self-pay | Admitting: Internal Medicine

## 2021-12-13 NOTE — Telephone Encounter (Signed)
Rep w/ [facility] informing provider ketorolac (TORADOL) 10 MG tablet can cause GI and Renal side effects in elderly pts  Rep inquiring if pt will be discontinuing meloxicam (MOBIC) 15 MG tablet  Rep requesting call back w/ verbal authorization   Ref: [id number]

## 2021-12-14 NOTE — Telephone Encounter (Signed)
Verbal Orders [phone]

## 2021-12-16 NOTE — Telephone Encounter (Signed)
Okay to discontinue Toradol.  Reduce meloxicam to as needed use.  Thanks

## 2021-12-24 ENCOUNTER — Encounter (HOSPITAL_BASED_OUTPATIENT_CLINIC_OR_DEPARTMENT_OTHER): Payer: Self-pay | Admitting: *Deleted

## 2021-12-24 ENCOUNTER — Other Ambulatory Visit: Payer: Self-pay

## 2021-12-24 ENCOUNTER — Emergency Department (HOSPITAL_BASED_OUTPATIENT_CLINIC_OR_DEPARTMENT_OTHER): Payer: Medicare Other

## 2021-12-24 ENCOUNTER — Emergency Department (HOSPITAL_BASED_OUTPATIENT_CLINIC_OR_DEPARTMENT_OTHER)
Admission: EM | Admit: 2021-12-24 | Discharge: 2021-12-24 | Disposition: A | Discharge status: Home / Self Care | Payer: Medicare Other | Attending: Emergency Medicine | Admitting: Emergency Medicine

## 2021-12-24 DIAGNOSIS — Z96641 Presence of right artificial hip joint: Secondary | ICD-10-CM | POA: Insufficient documentation

## 2021-12-24 DIAGNOSIS — E119 Type 2 diabetes mellitus without complications: Secondary | ICD-10-CM | POA: Diagnosis not present

## 2021-12-24 DIAGNOSIS — I10 Essential (primary) hypertension: Secondary | ICD-10-CM | POA: Insufficient documentation

## 2021-12-24 DIAGNOSIS — Z7982 Long term (current) use of aspirin: Secondary | ICD-10-CM | POA: Insufficient documentation

## 2021-12-24 DIAGNOSIS — I2510 Atherosclerotic heart disease of native coronary artery without angina pectoris: Secondary | ICD-10-CM | POA: Diagnosis not present

## 2021-12-24 DIAGNOSIS — Z7984 Long term (current) use of oral hypoglycemic drugs: Secondary | ICD-10-CM | POA: Diagnosis not present

## 2021-12-24 DIAGNOSIS — R079 Chest pain, unspecified: Secondary | ICD-10-CM | POA: Diagnosis not present

## 2021-12-24 DIAGNOSIS — Z79899 Other long term (current) drug therapy: Secondary | ICD-10-CM | POA: Diagnosis not present

## 2021-12-24 DIAGNOSIS — R0789 Other chest pain: Secondary | ICD-10-CM | POA: Diagnosis not present

## 2021-12-24 LAB — COMPREHENSIVE METABOLIC PANEL
ALT: 31 U/L (ref 0–44)
AST: 26 U/L (ref 15–41)
Albumin: 4.1 g/dL (ref 3.5–5.0)
Alkaline Phosphatase: 102 U/L (ref 38–126)
Anion gap: 5 (ref 5–15)
BUN: 12 mg/dL (ref 8–23)
CO2: 26 mmol/L (ref 22–32)
Calcium: 9.1 mg/dL (ref 8.9–10.3)
Chloride: 106 mmol/L (ref 98–111)
Creatinine, Ser: 1.23 mg/dL (ref 0.61–1.24)
GFR, Estimated: >60 mL/min (ref >60)
Glucose, Bld: 145 mg/dL — ABNORMAL HIGH (ref 70–99)
Potassium: 3.7 mmol/L (ref 3.5–5.1)
Sodium: 137 mmol/L (ref 135–145)
Total Bilirubin: 0.6 mg/dL (ref 0.3–1.2)
Total Protein: 7.2 g/dL (ref 6.5–8.1)

## 2021-12-24 LAB — CBC WITH DIFFERENTIAL/PLATELET
Abs Immature Granulocytes: 0.01 10*3/uL (ref 0.00–0.07)
Basophils Absolute: 0 10*3/uL (ref 0.0–0.1)
Basophils Relative: 0 %
Eosinophils Absolute: 0.1 10*3/uL (ref 0.0–0.5)
Eosinophils Relative: 1 %
HCT: 44.6 % (ref 39.0–52.0)
Hemoglobin: 14.6 g/dL (ref 13.0–17.0)
Immature Granulocytes: 0 %
Lymphocytes Relative: 18 %
Lymphs Abs: 1.2 10*3/uL (ref 0.7–4.0)
MCH: 28.6 pg (ref 26.0–34.0)
MCHC: 32.7 g/dL (ref 30.0–36.0)
MCV: 87.5 fL (ref 80.0–100.0)
Monocytes Absolute: 0.6 10*3/uL (ref 0.1–1.0)
Monocytes Relative: 9 %
Neutro Abs: 4.7 10*3/uL (ref 1.7–7.7)
Neutrophils Relative %: 72 %
Platelets: 154 10*3/uL (ref 150–400)
RBC: 5.1 MIL/uL (ref 4.22–5.81)
RDW: 15.8 % — ABNORMAL HIGH (ref 11.5–15.5)
WBC: 6.6 10*3/uL (ref 4.0–10.5)
nRBC: 0 % (ref 0.0–0.2)

## 2021-12-24 LAB — TROPONIN I (HIGH SENSITIVITY)
Troponin I (High Sensitivity): 5 ng/L (ref <18)
Troponin I (High Sensitivity): 4 ng/L (ref <18)

## 2021-12-24 LAB — D-DIMER, QUANTITATIVE: D-Dimer, Quant: 0.46 ug/mL-FEU (ref 0.00–0.50)

## 2021-12-24 MED ORDER — METHOCARBAMOL 500 MG PO TABS: 1000.0000 mg | ORAL_TABLET | Freq: Two times a day (BID) | ORAL | 0 refills | Status: AC

## 2021-12-24 MED ORDER — MORPHINE SULFATE (PF) 4 MG/ML IV SOLN
4.0000 mg | Freq: Once | INTRAVENOUS | Status: AC
Start: 2021-12-24 — End: 2021-12-24
  Administered 2021-12-24: 4 mg via INTRAVENOUS
  Filled 2021-12-24: qty 1

## 2021-12-24 MED ORDER — LIDOCAINE 5 % EX PTCH
1.0000 | MEDICATED_PATCH | CUTANEOUS | Status: DC
  Administered 2021-12-24: 1 via TRANSDERMAL
  Filled 2021-12-24: qty 1

## 2021-12-24 MED ORDER — OXYCODONE HCL 5 MG PO TABS: 5.0000 mg | ORAL_TABLET | ORAL | 0 refills | Status: DC | PRN

## 2021-12-24 MED ORDER — ONDANSETRON HCL 4 MG/2ML IJ SOLN
4.0000 mg | Freq: Once | INTRAMUSCULAR | Status: AC
  Administered 2021-12-24: 4 mg via INTRAVENOUS
  Filled 2021-12-24: qty 2

## 2021-12-24 MED ORDER — KETOROLAC TROMETHAMINE 15 MG/ML IJ SOLN
15.0000 mg | Freq: Once | INTRAMUSCULAR | Status: AC
  Administered 2021-12-24: 15 mg via INTRAVENOUS
  Filled 2021-12-24: qty 1

## 2021-12-24 NOTE — ED Provider Notes (Signed)
[facility] EMERGENCY DEPARTMENT Provider Note   CSN: [id number] Arrival date & time: 12/24/21  0840     History  Chief Complaint  Patient presents with   Chest Pain    Jacob Gordon is a 66 y.o. male.  This is a 66 y.o. male with significant medical history as below, including hypertension, hyperlipidemia, nonobstructive CAD who presents to the ED with complaint of right-sided chest pain.  Patient reports onset of symptoms approximately 3 days ago.  He is unsure about provoking event.  He feels as though he pulled a muscle to his right-sided chest wall.  Pain is sharp, stabbing, rating to his scapula.  Worse with torso movement, lifting his arm over his head.  He took Aleve x1 which did not resolve his symptoms.  No recent medication or diet changes.  He has experience similar symptoms in the past associated with muscle sprain.  Pain gradually worsening over the past 3 days.     Past Medical History: No date: ALLERGIC RHINITIS No date: Chronic headaches     Comment:  migraines No date: Complication of anesthesia     Comment:  violent vomiting  No date: Coronary artery disease     Comment:  non obstructive No date: DEGENERATIVE DISC DISEASE No date: DIABETES MELLITUS, TYPE II     Comment:  type 2 No date: GERD No date: History of kidney stones No date: HLD (hyperlipidemia) No date: HYPERTENSION No date: Internal hemorrhoids No date: OSTEOARTHRITIS No date: PONV (postoperative nausea and vomiting) No date: Sleep apnea     Comment:  no cpap mild case No date: THROMBOCYTOPENIA  Past Surgical History: 10/01/11: COLONOSCOPY     Comment:  internal hemorrhoids 01/07/2018: LEFT HEART CATH AND CORONARY ANGIOGRAPHY; N/A     Comment:  Procedure: LEFT HEART CATH AND CORONARY ANGIOGRAPHY;                Surgeon: Burnell Blanks, MD;  Location: [facility];  Service: Cardiovascular;  Laterality:               N/A; No date: SHOULDER  SURGERY     Comment:  right rotator cuff surg No date: TONSILLECTOMY AND ADENOIDECTOMY 07/28/2019: TOTAL HIP ARTHROPLASTY; Right     Comment:  Procedure: TOTAL HIP ARTHROPLASTY ANTERIOR APPROACH;                Surgeon: Gaynelle Arabian, MD;  Location: [facility];  Service:              Orthopedics;  Laterality: Right;  130mins No date: VASECTOMY    The history is provided by the patient. No language interpreter was used.  Chest Pain Associated symptoms: no abdominal pain, no cough, no dysphagia, no fever, no headache, no nausea, no palpitations, no shortness of breath and no vomiting    HPI: A 66 year old patient with a history of treated diabetes, hypertension and hypercholesterolemia presents for evaluation of chest pain. Initial onset of pain was more than 6 hours ago. The patient's chest pain is well-localized, is sharp and is not worse with exertion. The patient's chest pain is not middle- or left-sided, is not described as heaviness/pressure/tightness and does not radiate to the arms/jaw/neck. The patient does not complain of nausea and denies diaphoresis. The patient has no history of stroke, has no history of peripheral artery disease, has not smoked in the past 90 days,  has no relevant family history of coronary artery disease (first degree relative at less than age 57) and does not have an elevated BMI (>=30).   Home Medications Prior to Admission medications   Medication Sig Start Date End Date Taking? Authorizing Provider  amLODipine (NORVASC) 10 MG tablet TAKE 1 TABLET BY MOUTH EVERY DAY. NEEDS OFFICE VISIT FOR FURTHER REFILLS. 01/05/15   Plotnikov, Evie Lacks, MD  aspirin EC 81 MG tablet Take 81 mg by mouth daily.    [provider]  atorvastatin (LIPITOR) 80 MG tablet TAKE 1 TABLET(80 MG) BY MOUTH DAILY 11/12/21   Jerline Pain, MD  carvedilol (COREG) 25 MG tablet Take 1 tablet (25 mg total) by mouth 2 (two) times daily with a meal. Overdue for Annual appt must see provider for  future refills 12/07/20   Plotnikov, Evie Lacks, MD  Cholecalciferol (VITAMIN D-3) 125 MCG (5000 UT) TABS Take 5,000 Units by mouth daily.    [provider]  colesevelam (WELCHOL) 625 MG tablet TAKE 3 TABLETS BY MOUTH TWICE DAILY WITH A MEAL 12/04/21   Plotnikov, Evie Lacks, MD  finasteride (PROSCAR) 5 MG tablet TAKE ONE TABLET BY MOUTH DAILY 12/13/21   Plotnikov, Evie Lacks, MD  Galcanezumab-gnlm (EMGALITY) 120 MG/ML SOAJ Inject 120 mg into the skin every 30 (thirty) days. 07/31/21   Melvenia Beam, MD  HYDROcodone-acetaminophen (NORCO) 5-325 MG tablet Take 1 tablet by mouth every 4 (four) hours as needed for severe pain (Renal colic). 12/04/21   Plotnikov, Evie Lacks, MD  meloxicam (MOBIC) 15 MG tablet Take 1 tablet (15 mg total) by mouth daily as needed for pain. 12/04/21   Plotnikov, Evie Lacks, MD  methocarbamol (ROBAXIN) 500 MG tablet Take 1 tablet (500 mg total) by mouth every 6 (six) hours as needed for muscle spasms. 07/29/19   [place], Museum/gallery conservator, PA-C  OVER THE COUNTER MEDICATION Take 1 capsule by mouth daily. Suprema Dophilus Supplement - probiotic    [provider]  pantoprazole (PROTONIX) 40 MG tablet TAKE 1 TABLET BY MOUTH DAILY 04/13/20   Plotnikov, Evie Lacks, MD  Semaglutide (RYBELSUS) 7 MG TABS Take 1 tablet by mouth every morning. 02/14/21   Plotnikov, Evie Lacks, MD  sitaGLIPtin (JANUVIA) 100 MG tablet Take 1 tablet (100 mg total) by mouth daily. Overdue for Annual appt must see provider for future refills 12/07/20   Plotnikov, Evie Lacks, MD  tadalafil (CIALIS) 5 MG tablet TAKE ONE TABLET BY MOUTH DAILY 12/04/21   Plotnikov, Evie Lacks, MD  tamsulosin (FLOMAX) 0.4 MG CAPS capsule Take 1 capsule (0.4 mg total) by mouth daily as needed. Renal colic [id number]   Plotnikov, Evie Lacks, MD  terazosin (HYTRIN) 10 MG capsule Take 10 mg by mouth at bedtime.    [provider]  Ubrogepant (UBRELVY) 100 MG TABS Take 100 mg by mouth every 2 (two) hours as needed. Maximum 200mg  a day.  04/26/21   Melvenia Beam, MD      Allergies    Tape, Sulfa antibiotics, Valsartan, and Pravastatin sodium    Review of Systems   Review of Systems  Constitutional:  Negative for chills and fever.  HENT:  Negative for facial swelling and trouble swallowing.   Eyes:  Negative for photophobia and visual disturbance.  Respiratory:  Negative for cough and shortness of breath.   Cardiovascular:  Positive for chest pain. Negative for palpitations.  Gastrointestinal:  Negative for abdominal pain, nausea and vomiting.  Endocrine: Negative for polydipsia and polyuria.  Genitourinary:  Negative for difficulty urinating and hematuria.  Musculoskeletal:  Positive for arthralgias. Negative for gait problem and joint swelling.  Skin:  Negative for pallor and rash.  Neurological:  Negative for syncope and headaches.  Psychiatric/Behavioral:  Negative for agitation and confusion.    Physical Exam Updated Vital Signs BP (!) 143/97 (BP Location: Right Arm)    Pulse 66    Temp 98.5 F (36.9 C) (Oral)    Resp 15    Ht 5\' 9"  (1.753 m)    Wt 93 kg    SpO2 100%    BMI 30.27 kg/m  Physical Exam Vitals and nursing note reviewed.  Constitutional:      General: He is not in acute distress.    Appearance: He is well-developed.  HENT:     Head: Normocephalic and atraumatic.     Right Ear: External ear normal.     Left Ear: External ear normal.     Mouth/Throat:     Mouth: Mucous membranes are moist.  Eyes:     General: No scleral icterus. Cardiovascular:     Rate and Rhythm: Normal rate and regular rhythm.     Pulses: Normal pulses.     Heart sounds: Normal heart sounds.  Pulmonary:     Effort: Pulmonary effort is normal. No respiratory distress.     Breath sounds: Normal breath sounds.  Abdominal:     General: Abdomen is flat.     Palpations: Abdomen is soft.     Tenderness: There is no abdominal tenderness.  Musculoskeletal:        General: Normal range of motion.       Arms:     Cervical  back: Normal range of motion.     Right lower leg: No edema.     Left lower leg: No edema.  Skin:    General: Skin is warm and dry.     Capillary Refill: Capillary refill takes less than 2 seconds.  Neurological:     Mental Status: He is alert and oriented to person, place, and time.  Psychiatric:        Mood and Affect: Mood normal.        Behavior: Behavior normal.    ED Results / Procedures / Treatments   Labs (all labs ordered are listed, but only abnormal results are displayed) Labs Reviewed  CBC WITH DIFFERENTIAL/PLATELET - Abnormal; Notable for the following components:      Result Value   RDW 15.8 (*)    All other components within normal limits  COMPREHENSIVE METABOLIC PANEL - Abnormal; Notable for the following components:   Glucose, Bld 145 (*)    All other components within normal limits  D-DIMER, QUANTITATIVE  TROPONIN I (HIGH SENSITIVITY)  TROPONIN I (HIGH SENSITIVITY)    EKG EKG Interpretation  Date/Time:  Monday December 24 2021 08:50:03 EST Ventricular Rate:  77 PR Interval:  184 QRS Duration: 78 QT Interval:  378 QTC Calculation: 427 R Axis:   -6 Text Interpretation: Normal sinus rhythm Low voltage QRS Inferior infarct , age undetermined Abnormal ECG No previous ECGs available Interpretation limited secondary to artifact no stemi Confirmed by Wynona Dove ([id number]) on 12/24/2021 12:54:34 PM  Radiology DG Chest 2 View  Result Date: 12/24/2021 CLINICAL DATA:  Right chest pain EXAM: CHEST - 2 VIEW COMPARISON:  Chest radiograph 05/22/2021 FINDINGS: The cardiomediastinal silhouette is normal. There is no focal consolidation or pulmonary edema. There is no pleural effusion or pneumothorax. There is no acute osseous  abnormality. IMPRESSION: No radiographic evidence of acute cardiopulmonary process. Electronically Signed   By: Valetta Mole M.D.   On: 12/24/2021 09:20    Procedures Procedures    Medications Ordered in ED Medications  lidocaine (LIDODERM) 5 % 1  patch (1 patch Transdermal Patch Applied 12/24/21 1305)  ketorolac (TORADOL) 15 MG/ML injection 15 mg (15 mg Intravenous Given 12/24/21 1302)  morphine (PF) 4 MG/ML injection 4 mg (4 mg Intravenous Given 12/24/21 1302)  ondansetron (ZOFRAN) injection 4 mg (4 mg Intravenous Given 12/24/21 1302)    ED Course/ Medical Decision Making/ A&P   HEAR Score: 4                       Medical Decision Making Amount and/or Complexity of Data Reviewed External Data Reviewed: labs, radiology and notes. Labs: ordered. Decision-making details documented in ED Course. Radiology: ordered. Decision-making details documented in ED Course. ECG/medicine tests: ordered and independent interpretation performed. Decision-making details documented in ED Course.  Risk Prescription drug management.    CC: cp  This patient presents to the Emergency Department for the above complaint. This involves an extensive number of treatment options and is a complaint that carries with it a high risk of complications and morbidity. Vital signs were reviewed. Serious etiologies considered.  Record review:  Previous records obtained and reviewed   Medical and surgical history as noted above.   Work up as above, notable for:  Labs & imaging results that were available during my care of the patient were reviewed by me and considered in my medical decision making.   I ordered imaging studies which included CXR and I reviewed imaging which showed no acute process  Cardiac monitoring reviewed and interpreted personally which shows NSR  Social determinants of health include - N/a  Management: Analgesics, Lidoderm patch  Reassessment:  Patient reports he is feeling significantly better.  He still has mild reducible pain on palpation of his right-sided chest wall.  EKG without evidence acute ischemia, troponin negative x2.  D-dimer is indicative of PE.  Low risk Wells score.  PE is unlikely.  Heart score 4.  I have low  suspicion for ACS as etiology of this patient's presenting complaints.  Advised patient follow-up with cardiology as outpatient.   The patient's chest pain is not suggestive of pulmonary embolus, cardiac ischemia, aortic dissection, pericarditis, myocarditis, pulmonary embolism, pneumothorax, pneumonia, Zoster, or esophageal perforation, or other serious etiology.  Historically not abrupt in onset, tearing or ripping, pulses symmetric. EKG nonspecific for ischemia/infarction. No dysrhythmias, brugada, WPW, prolonged QT noted.    Troponin negative x2. CXR reviewed. Labs without demonstration of acute pathology unless otherwise noted above.   Given the extremely low risk of these diagnoses further testing and evaluation for these possibilities does not appear to be indicated at this time. Patient in no distress and overall condition improved here in the ED. Detailed discussions were had with the patient regarding current findings, and need for close f/u with PCP or on call doctor. The patient has been instructed to return immediately if the symptoms worsen in any way for re-evaluation. Patient verbalized understanding and is in agreement with current care plan. All questions answered prior to discharge.          This chart was dictated using voice recognition software.  Despite best efforts to proofread,  errors can occur which can change the documentation meaning.         Final Clinical Impression(s) / ED  Diagnoses Final diagnoses:  Atypical chest pain    Rx / DC Orders ED Discharge Orders     None         Jeanell Sparrow, DO 12/24/21 1446

## 2021-12-24 NOTE — Discharge Instructions (Addendum)
You may use over-the-counter lidocaine patches if needed  Return to the Emergency Department if you have unusual chest pain, pressure, or discomfort, shortness of breath, nausea, vomiting, burping, heartburn, tingling upper body parts, sweating, cold, clammy skin, or racing heartbeat. Call 911 if you think you are having a heart attack. Take all cardiac medications as prescribed - notify your doctor if you have any side effects. Follow cardiac diet - avoid fatty & fried foods, don't eat too much red meat, eat lots of fruits & vegetables, and dairy products should be low fat. Please lose weight if you are overweight. Become more active with walking, gardening, or any other activity that gets you to moving.   Please return to the emergency department immediately for any new or concerning symptoms, or if you get worse.

## 2021-12-24 NOTE — ED Triage Notes (Signed)
C/o chest pai x 30 hours  upper right chest into back, pain increased w deep breath and movement,  headache and diarrhea onset this am

## 2021-12-25 ENCOUNTER — Ambulatory Visit: Payer: Medicare Other | Admitting: Internal Medicine

## 2021-12-25 DIAGNOSIS — H02823 Cysts of right eye, unspecified eyelid: Secondary | ICD-10-CM | POA: Diagnosis not present

## 2021-12-25 DIAGNOSIS — L821 Other seborrheic keratosis: Secondary | ICD-10-CM | POA: Diagnosis not present

## 2021-12-25 DIAGNOSIS — Z23 Encounter for immunization: Secondary | ICD-10-CM | POA: Diagnosis not present

## 2021-12-25 DIAGNOSIS — D225 Melanocytic nevi of trunk: Secondary | ICD-10-CM | POA: Diagnosis not present

## 2021-12-26 ENCOUNTER — Other Ambulatory Visit: Payer: Self-pay

## 2021-12-26 ENCOUNTER — Ambulatory Visit (INDEPENDENT_AMBULATORY_CARE_PROVIDER_SITE_OTHER)
Admission: RE | Admit: 2021-12-26 | Discharge: 2021-12-26 | Disposition: A | Discharge status: Home / Self Care | Payer: Medicare Other | Source: Ambulatory Visit | Attending: Internal Medicine | Admitting: Internal Medicine

## 2021-12-26 DIAGNOSIS — R1012 Left upper quadrant pain: Secondary | ICD-10-CM

## 2021-12-26 DIAGNOSIS — K7689 Other specified diseases of liver: Secondary | ICD-10-CM | POA: Diagnosis not present

## 2021-12-26 DIAGNOSIS — N400 Enlarged prostate without lower urinary tract symptoms: Secondary | ICD-10-CM | POA: Diagnosis not present

## 2021-12-26 DIAGNOSIS — N3289 Other specified disorders of bladder: Secondary | ICD-10-CM | POA: Diagnosis not present

## 2021-12-26 DIAGNOSIS — N23 Unspecified renal colic: Secondary | ICD-10-CM | POA: Diagnosis not present

## 2021-12-26 DIAGNOSIS — K429 Umbilical hernia without obstruction or gangrene: Secondary | ICD-10-CM

## 2021-12-26 DIAGNOSIS — N200 Calculus of kidney: Secondary | ICD-10-CM | POA: Diagnosis not present

## 2022-01-02 ENCOUNTER — Telehealth: Payer: Self-pay | Admitting: *Deleted

## 2022-01-02 MED ORDER — SITAGLIPTIN PHOSPHATE 100 MG PO TABS: 100.0000 mg | ORAL_TABLET | Freq: Every day | ORAL | 0 refills | Status: DC

## 2022-01-02 MED ORDER — CARVEDILOL 25 MG PO TABS: 25.0000 mg | ORAL_TABLET | Freq: Two times a day (BID) | ORAL | 0 refills | Status: DC

## 2022-01-02 NOTE — Telephone Encounter (Signed)
Patient medications sent to pharmacy

## 2022-01-09 ENCOUNTER — Encounter: Payer: Self-pay | Admitting: Internal Medicine

## 2022-01-09 NOTE — Telephone Encounter (Signed)
Spoke with pharmacy and was able to clarify things with the pts pharmacy. ?

## 2022-01-14 ENCOUNTER — Other Ambulatory Visit: Payer: Self-pay

## 2022-01-14 MED ORDER — MELOXICAM 15 MG PO TABS: 15.0000 mg | ORAL_TABLET | Freq: Every day | ORAL | 3 refills | Status: DC | PRN

## 2022-01-14 MED ORDER — FINASTERIDE 5 MG PO TABS: 5.0000 mg | ORAL_TABLET | Freq: Every day | ORAL | 3 refills | Status: DC

## 2022-01-14 MED ORDER — CARVEDILOL 25 MG PO TABS: 25.0000 mg | ORAL_TABLET | Freq: Two times a day (BID) | ORAL | 3 refills | Status: DC

## 2022-01-14 MED ORDER — TADALAFIL 5 MG PO TABS: ORAL_TABLET | ORAL | 3 refills | Status: DC

## 2022-01-14 MED ORDER — SITAGLIPTIN PHOSPHATE 100 MG PO TABS: 100.0000 mg | ORAL_TABLET | Freq: Every day | ORAL | 3 refills | Status: DC

## 2022-01-17 ENCOUNTER — Other Ambulatory Visit: Payer: Self-pay

## 2022-01-17 ENCOUNTER — Ambulatory Visit (INDEPENDENT_AMBULATORY_CARE_PROVIDER_SITE_OTHER): Payer: Medicare Other | Admitting: Internal Medicine

## 2022-01-17 ENCOUNTER — Encounter: Payer: Self-pay | Admitting: Internal Medicine

## 2022-01-17 VITALS — BP 122/82 | HR 95 | Temp 98.6°F | Ht 69.0 in | Wt 211.0 lb

## 2022-01-17 DIAGNOSIS — E1121 Type 2 diabetes mellitus with diabetic nephropathy: Secondary | ICD-10-CM | POA: Diagnosis not present

## 2022-01-17 DIAGNOSIS — I159 Secondary hypertension, unspecified: Secondary | ICD-10-CM

## 2022-01-17 DIAGNOSIS — I2510 Atherosclerotic heart disease of native coronary artery without angina pectoris: Secondary | ICD-10-CM

## 2022-01-17 DIAGNOSIS — R079 Chest pain, unspecified: Secondary | ICD-10-CM | POA: Diagnosis not present

## 2022-01-17 DIAGNOSIS — E785 Hyperlipidemia, unspecified: Secondary | ICD-10-CM | POA: Diagnosis not present

## 2022-01-17 DIAGNOSIS — N200 Calculus of kidney: Secondary | ICD-10-CM | POA: Diagnosis not present

## 2022-01-17 DIAGNOSIS — R1012 Left upper quadrant pain: Secondary | ICD-10-CM

## 2022-01-17 MED ORDER — ICOSAPENT ETHYL 1 G PO CAPS: 2.0000 g | ORAL_CAPSULE | Freq: Two times a day (BID) | ORAL | 3 refills | Status: DC

## 2022-01-17 MED ORDER — CARVEDILOL 25 MG PO TABS: 25.0000 mg | ORAL_TABLET | Freq: Two times a day (BID) | ORAL | 3 refills | Status: DC

## 2022-01-17 NOTE — Progress Notes (Signed)
? ?Subjective:  ?Patient ID: Jacob Gordon, male    DOB: 1955-11-18  Age: 66 y.o. MRN: [id number] ? ?CC: No chief complaint on file. ? ? ?HPI ?[place] presents for R CP - ER visit on 12/24/21 - MS relieved the pain. CXR was ok, D dimer, troponin was (-). Pain did not come back ? ?LUQ pain did not come back ? ?Outpatient Medications Prior to Visit  ?Medication Sig Dispense Refill  ? amLODipine (NORVASC) 10 MG tablet TAKE 1 TABLET BY MOUTH EVERY DAY. NEEDS OFFICE VISIT FOR FURTHER REFILLS. 90 tablet 2  ? aspirin EC 81 MG tablet Take 81 mg by mouth daily.    ? atorvastatin (LIPITOR) 80 MG tablet TAKE 1 TABLET(80 MG) BY MOUTH DAILY 90 tablet 1  ? carvedilol (COREG) 25 MG tablet Take 1 tablet (25 mg total) by mouth 2 (two) times daily with a meal. 180 tablet 3  ? Cholecalciferol (VITAMIN D-3) 125 MCG (5000 UT) TABS Take 5,000 Units by mouth daily.    ? colesevelam (WELCHOL) 625 MG tablet TAKE 3 TABLETS BY MOUTH TWICE DAILY WITH A MEAL 540 tablet 3  ? HYDROcodone-acetaminophen (NORCO) 5-325 MG tablet Take 1 tablet by mouth every 4 (four) hours as needed for severe pain (Renal colic). 20 tablet 0  ? meloxicam (MOBIC) 15 MG tablet Take 1 tablet (15 mg total) by mouth daily as needed for pain. 90 tablet 3  ? methocarbamol (ROBAXIN) 500 MG tablet Take 1 tablet (500 mg total) by mouth every 6 (six) hours as needed for muscle spasms. 40 tablet 0  ? OVER THE COUNTER MEDICATION Take 1 capsule by mouth daily. Suprema Dophilus Supplement - probiotic    ? oxyCODONE (ROXICODONE) 5 MG immediate release tablet Take 1 tablet (5 mg total) by mouth every 4 (four) hours as needed for severe pain. 5 tablet 0  ? pantoprazole (PROTONIX) 40 MG tablet TAKE 1 TABLET BY MOUTH DAILY 30 tablet 11  ? Semaglutide (RYBELSUS) 7 MG TABS Take 1 tablet by mouth every morning. 30 tablet 11  ? sitaGLIPtin (JANUVIA) 100 MG tablet Take 1 tablet (100 mg total) by mouth daily. 90 tablet 3  ? tadalafil (CIALIS) 5 MG tablet TAKE ONE TABLET BY  MOUTH DAILY 90 tablet 3  ? tamsulosin (FLOMAX) 0.4 MG CAPS capsule Take 1 capsule (0.4 mg total) by mouth daily as needed. Renal colic 30 capsule 1  ? terazosin (HYTRIN) 10 MG capsule Take 10 mg by mouth at bedtime.    ? Galcanezumab-gnlm (EMGALITY) 120 MG/ML SOAJ Inject 120 mg into the skin every 30 (thirty) days. (Patient not taking: Reported on 01/17/2022) 1.12 mL 11  ? Ubrogepant (UBRELVY) 100 MG TABS Take 100 mg by mouth every 2 (two) hours as needed. Maximum 200mg  a day. (Patient not taking: Reported on 01/17/2022) 16 tablet 11  ? finasteride (PROSCAR) 5 MG tablet Take 1 tablet (5 mg total) by mouth daily. 90 tablet 3  ? ?No facility-administered medications prior to visit.  ? ? ?ROS: ?Review of Systems  ?Constitutional:  Negative for appetite change, fatigue and unexpected weight change.  ?HENT:  Negative for congestion, nosebleeds, sneezing, sore throat and trouble swallowing.   ?Eyes:  Negative for itching and visual disturbance.  ?Respiratory:  Negative for cough.   ?Cardiovascular:  Negative for chest pain, palpitations and leg swelling.  ?Gastrointestinal:  Negative for abdominal distention, blood in stool, diarrhea and nausea.  ?Genitourinary:  Negative for frequency and hematuria.  ?Musculoskeletal:  Negative for back  pain, gait problem, joint swelling and neck pain.  ?Skin:  Negative for rash.  ?Neurological:  Negative for dizziness, tremors, speech difficulty and weakness.  ?Psychiatric/Behavioral:  Negative for agitation, dysphoric mood and sleep disturbance. The patient is not nervous/anxious.   ? ?Objective:  ?BP 122/82 (BP Location: Left Arm, Patient Position: Sitting, Cuff Size: Large)   Pulse 95   Temp 98.6 ?F (37 ?C) (Oral)   Ht 5\' 9"  (1.753 m)   Wt 211 lb (95.7 kg)   SpO2 97%   BMI 31.16 kg/m?  ? ?BP Readings from Last 3 Encounters:  ?01/17/22 122/82  ?12/24/21 (!) 133/91  ?12/04/21 130/82  ? ? ?Wt Readings from Last 3 Encounters:  ?01/17/22 211 lb (95.7 kg)  ?12/24/21 205 lb (93 kg)   ?12/04/21 204 lb 9.6 oz (92.8 kg)  ? ? ?Physical Exam ?Constitutional:   ?   General: He is not in acute distress. ?   Appearance: Normal appearance. He is well-developed.  ?   Comments: NAD  ?Eyes:  ?   Conjunctiva/sclera: Conjunctivae normal.  ?   Pupils: Pupils are equal, round, and reactive to light.  ?Neck:  ?   Thyroid: No thyromegaly.  ?   Vascular: No JVD.  ?Cardiovascular:  ?   Rate and Rhythm: Normal rate and regular rhythm.  ?   Heart sounds: Normal heart sounds. No murmur heard. ?  No friction rub. No gallop.  ?Pulmonary:  ?   Effort: Pulmonary effort is normal. No respiratory distress.  ?   Breath sounds: Normal breath sounds. No wheezing or rales.  ?Chest:  ?   Chest wall: No tenderness.  ?Abdominal:  ?   General: Bowel sounds are normal. There is no distension.  ?   Palpations: Abdomen is soft. There is no mass.  ?   Tenderness: There is no abdominal tenderness. There is no guarding or rebound.  ?Musculoskeletal:     ?   General: No tenderness. Normal range of motion.  ?   Cervical back: Normal range of motion.  ?Lymphadenopathy:  ?   Cervical: No cervical adenopathy.  ?Skin: ?   General: Skin is warm and dry.  ?   Findings: No rash.  ?Neurological:  ?   Mental Status: He is alert and oriented to person, place, and time.  ?   Cranial Nerves: No cranial nerve deficit.  ?   Motor: No weakness or abnormal muscle tone.  ?   Coordination: Coordination normal.  ?   Gait: Gait normal.  ?   Deep Tendon Reflexes: Reflexes are normal and symmetric.  ?Psychiatric:     ?   Behavior: Behavior normal.     ?   Thought Content: Thought content normal.     ?   Judgment: Judgment normal.  ? ? ?Lab Results  ?Component Value Date  ? WBC 6.6 12/24/2021  ? HGB 14.6 12/24/2021  ? HCT 44.6 12/24/2021  ? PLT 154 12/24/2021  ? GLUCOSE 145 (H) 12/24/2021  ? CHOL 146 12/13/2020  ? TRIG 98.0 12/13/2020  ? HDL 39.30 12/13/2020  ? LDLDIRECT 184.1 10/20/2012  ? [place] 87 12/13/2020  ? ALT 31 12/24/2021  ? AST 26 12/24/2021  ? NA  137 12/24/2021  ? K 3.7 12/24/2021  ? CL 106 12/24/2021  ? CREATININE 1.23 12/24/2021  ? BUN 12 12/24/2021  ? CO2 26 12/24/2021  ? TSH 1.50 12/13/2020  ? PSA 1.13 12/13/2020  ? INR 1.2 07/22/2019  ? HGBA1C 7.0 (H) 12/04/2021  ?  MICROALBUR 0.9 12/13/2020  ? ? ?CT RENAL STONE STUDY ? ?Result Date: 12/27/2021 ?CLINICAL DATA:  Left renal colic. EXAM: CT ABDOMEN AND PELVIS WITHOUT CONTRAST TECHNIQUE: Multidetector CT imaging of the abdomen and pelvis was performed following the standard protocol without IV contrast. RADIATION DOSE REDUCTION: This exam was performed according to the departmental dose-optimization program which includes automated exposure control, adjustment of the mA and/or kV according to patient size and/or use of iterative reconstruction technique. COMPARISON:  CT with IV contrast 06/12/2015 FINDINGS: Lower chest: There is left lower lobe linear scarring or atelectasis. There are no lung base infiltrates. The cardiac size is normal. Small chronic pericardial effusion is noted anteriorly and calcification in the distal LAD and right coronary arteries. Hepatobiliary: Several scattered hepatic cysts. Largest in the right lobe is 2.7 cm and 7.4 Hounsfield units. Largest in the left lobe is 2.3 cm in 7.3 Hounsfield units. Some were present previously and larger than previously and some are new. There are additional too small to characterize scattered hypodensities. Gallbladder and bile ducts are unremarkable. Pancreas: Unremarkable without contrast. Spleen: Unremarkable without contrast. Adrenals/Urinary Tract: No adrenal mass is seen. Small cyst again noted posteriorly in the upper pole left kidney. The renal cortex otherwise unremarkable without contrast. There are several scattered punctate nonobstructive caliceal stones in the upper to midpole right kidney, and scattered punctate up to 1-2 mm stones in the left renal collecting system. No hydronephrosis or ureteral stones are seen although please note  the distal right ureter below the level of the acetabulum is obscured by metal artifact from a right hip arthroplasty. Bladder wall is thickened which was seen previously, but is not fully distended. Stomach/Bowel: N

## 2022-01-17 NOTE — Assessment & Plan Note (Addendum)
R CP - ER visit on 12/24/21 - MS relieved the pain. CXR was ok, D dimer, troponin was (-). Pain did not come back ?Pleurisy vs other, ie OA ? ?Norco prn if relapsed ? ? ?

## 2022-01-17 NOTE — Assessment & Plan Note (Signed)
Cont on Rybelsus, Januvia ?Monitor A1c ?

## 2022-01-17 NOTE — Assessment & Plan Note (Signed)
Continue on amlodipine, Coreg ?

## 2022-01-17 NOTE — Assessment & Plan Note (Signed)
Resolved

## 2022-01-17 NOTE — Assessment & Plan Note (Signed)
Resolved ?Abd CT was OK ?

## 2022-01-17 NOTE — Assessment & Plan Note (Signed)
No angina ?On Rx ?

## 2022-01-30 ENCOUNTER — Telehealth (INDEPENDENT_AMBULATORY_CARE_PROVIDER_SITE_OTHER): Payer: Medicare Other | Admitting: Neurology

## 2022-01-30 DIAGNOSIS — G43711 Chronic migraine without aura, intractable, with status migrainosus: Secondary | ICD-10-CM

## 2022-01-30 MED ORDER — EMGALITY 120 MG/ML ~~LOC~~ SOAJ: 120.0000 mg | SUBCUTANEOUS | 0 refills | Status: DC

## 2022-01-30 NOTE — Progress Notes (Signed)
?[facility] ? ? ? ?Provider:  Dr Jaynee Eagles ?Requesting Provider: Plotnikov, Evie Lacks, MD ?Primary Care Provider:  Plotnikov, Evie Lacks, MD ? ?CC:  chronic migraines ? ?Virtual Visit via Video Note ? ?I connected with Jacob Gordon on 01/30/22 at 11:00 AM EDT by a video enabled telemedicine application and verified that I am speaking with the correct person using two identifiers. ? ?Location: ?Patient: home ?Provider: office ?  ?I discussed the limitations of evaluation and management by telemedicine and the availability of in person appointments. The patient expressed understanding and agreed to proceed. ? ? ?Follow Up Instructions: ? ?  ?I discussed the assessment and treatment plan with the patient. The patient was provided an opportunity to ask questions and all were answered. The patient agreed with the plan and demonstrated an understanding of the instructions. ?  ?The patient was advised to call back or seek an in-person evaluation if the symptoms worsen or if the condition fails to improve as anticipated. ? ?I provided 30 minutes of non-face-to-face time during this encounter. ? ? ?Melvenia Beam, MD ? ? ?Interval 01/30/2022: he did GREAT on the emgality. He is in the donut hole. He could not afford it. I will leave him a year of samples and readdress next year.  The [place] may also cover it, we will look into it. He never reached out to them. We will see if we can get Emgality or Ajovy from the [place] but in the meantime will leave samples of the front. The Roselyn Meier works but we can try to get that from the [place] as well. Can also look into Emgality or Ajovy programs to get the medications for free. Daily headaches to less than 8 headache days a month, the [place] works if he takes it right away. So infrequent he doesn't even chart it anymore.  ? ?Patient complains of symptoms per HPI as well as the following symptoms: migraine . Pertinent negatives and positives per HPI. All others  negative ? ? ?Interval history 07/31/2021: Doing exceptionally well.  Has gone from daily headaches to <8 headaches days a month and many being mild and easily treatable with Roselyn Meier which helps. He has not had a single severe headache since starting the Emgality. When he takes the [place], it takes a few hours and the headache is completely gone, advised to take it right away and take it early. We will continue Emgality. ? ? ?HPI:  Jacob Gordon is a 66 y.o. male here as requested by Plotnikov, Evie Lacks, MD for daily headaches.  Past medical history chronic headaches, coronary artery disease, degenerative disc disease, diabetes, history of kidney stones, hyperlipidemia, hypertension, osteoarthritis, sleep apnea, thrombocytopenia.  I reviewed Dr. Janeice Robinson notes: Patient was seen for headaches, he has a long life history of daily sinus headaches that affected mainly at night, sometimes awaken him up at night, he does suffer with chronic nasal congestion and has a lot of sneezing in the morning, he does not have much anterior nasal discharge but has chronic postnasal drip, he also has a chronic sensation of a glob of something in the back of his throat, he does suffer with heartburn, he is on Nexium but he takes it at nighttime, his only dietary risk factor for reflux is chocolate consumption.  CT was negative for chronic sinus disease, sent for evaluation of possible migraines.  ? ?He has had headaches as far back as he can remember as a teenager. No idenfiable triggers or  inciting events, he has been seeing ENTs and allergists to see if that is the problem. He has pressure behind the eyes, both eyes, he finds himself holding his nose and pressure points to make it better, more pressure in the temples, massage doesn't help, they are pulsating or pounding or throbbing more pressure, he has light sensitivity with the headaches, no smell or sound sensitivity, + nausea, it can be severe at times usually mild to  moderate or severe and daily all day for years. Turning off the lights and not moving helps, movement makes it worse, no FHx of migraines or headaches. He has the headaches daily. It will frequently wake him up in the middle of the night to take excedrin. No other lacrimation or rhinorrhea or cluster-type headaches. He is not a good sleeper. Taking daily excedrin. More frequent over the years. He remembers the headaches being daily even since HS. No aura. No other focal neurologic deficits, associated symptoms, inciting events or modifiable factors. ? ? ?Reviewed notes, labs and imaging from outside physicians, which showed: ? ?I reviewed CT paranasal sinuses without contrast report February 05, 2021 impression: No significant paranasal sinus inflammatory changes.  Patent drainage pathways. ? ?CT head 12/15/2020: IMPRESSION: Personally reviewed images and agree. ?1. No evidence of acute intracranial abnormality. ?2. Mild chronic microvascular ischemic disease. ? ?Medications tried: carvedilol, meloxicam, gabapentin, amlodipine, cymbalta, metoprolol, rabaxin, nortriptyline, sumatriptan, rizatriptan, zolmitriptan ? ?Review of Systems: ?Patient complains of symptoms per HPI as well as the following symptoms headache. Pertinent negatives and positives per HPI. All others negative. ? ? ?Social History  ? ?Socioeconomic History  ? Marital status: Married  ?  Spouse name: Not on file  ? Number of children: 1  ? Years of education: Not on file  ? Highest education level: Not on file  ?Occupational History  ? Occupation: Data processing manager   ?Tobacco Use  ? Smoking status: Never  ? Smokeless tobacco: Never  ?Vaping Use  ? Vaping Use: Never used  ?Substance and Sexual Activity  ? Alcohol use: No  ? Drug use: No  ? Sexual activity: Yes  ?Other Topics Concern  ? Not on file  ?Social History Narrative  ? Caffeine daily   ? ?Social Determinants of Health  ? ?Financial Resource Strain: Not on file  ?Food Insecurity: Not on file   ?Transportation Needs: Not on file  ?Physical Activity: Not on file  ?Stress: Not on file  ?Social Connections: Not on file  ?Intimate Partner Violence: Not on file  ? ? ?Family History  ?Problem Relation Age of Onset  ? Diabetes Sister   ? Colon cancer Neg Hx   ? ? ?Past Medical History:  ?Diagnosis Date  ? ALLERGIC RHINITIS   ? Chronic headaches   ? migraines  ? Complication of anesthesia   ? violent vomiting   ? Coronary artery disease   ? non obstructive  ? DEGENERATIVE DISC DISEASE   ? DIABETES MELLITUS, TYPE II   ? type 2  ? GERD   ? History of kidney stones   ? HLD (hyperlipidemia)   ? HYPERTENSION   ? Internal hemorrhoids   ? OSTEOARTHRITIS   ? PONV (postoperative nausea and vomiting)   ? Sleep apnea   ? no cpap mild case  ? THROMBOCYTOPENIA   ? ? ?Patient Active Problem List  ? Diagnosis Date Noted  ? Calculus of ureter 01/17/2022  ? Chronic post-traumatic stress disorder 01/17/2022  ? Lumbago with sciatica, right side 01/17/2022  ?  Melanocytic nevi of trunk 01/17/2022  ? Muscle weakness (generalized) 01/17/2022  ? Kidney stone 01/17/2022  ? Nocturia 01/17/2022  ? Osteoarthrosis, localized, primary, involving shoulder region 01/17/2022  ? Skin tag 01/17/2022  ? Right-sided chest pain 01/17/2022  ? Left upper quadrant abdominal pain 12/10/2021  ? Renal colic on left side [id number]  ? Chronic migraine without aura, with intractable migraine, so stated, with status migrainosus 07/31/2021  ? Chronic migraine without aura without status migrainosus, not intractable 04/26/2021  ? Chronic tension-type headache, not intractable 04/26/2021  ? Analgesic rebound headache 04/26/2021  ? Daily headache 02/05/2021  ? Laryngopharyngeal reflux (LPR) 02/05/2021  ? OA (osteoarthritis) of hip 07/28/2019  ? Preop exam for internal medicine 06/10/2019  ? Piriformis syndrome, right 03/11/2019  ? Gluteal tendinitis of right buttock 02/04/2019  ? Arthritis of right hip 02/04/2019  ? Hemorrhoids 01/15/2019  ? Discoloration of skin  of toe 08/13/2018  ? Left foot pain 08/13/2018  ? Right hip pain 08/13/2018  ? Abnormal cardiac CT angiography   ? Coronary artery disease involving native coronary artery of native heart without angina pectoris   ? Angina pec

## 2022-01-31 ENCOUNTER — Telehealth: Payer: Self-pay | Admitting: *Deleted

## 2022-01-31 DIAGNOSIS — G43709 Chronic migraine without aura, not intractable, without status migrainosus: Secondary | ICD-10-CM

## 2022-01-31 MED ORDER — EMGALITY 120 MG/ML ~~LOC~~ SOAJ: 120.0000 mg | SUBCUTANEOUS | 11 refills | Status: DC

## 2022-01-31 MED ORDER — UBRELVY 100 MG PO TABS: 100.0000 mg | ORAL_TABLET | ORAL | 11 refills | Status: DC | PRN

## 2022-01-31 NOTE — Telephone Encounter (Signed)
I called pt and stated that [facility] Jacob Gordon and [employer].  I have escript to them at [facility].  Will let him know once we here something from them or if he hears something from them.   ?

## 2022-02-04 NOTE — Progress Notes (Signed)
?Cardiology Office Note:   ? ?Date:  02/05/2022  ? ?ID:  Jacob DawleyLinwood Matthew Gordon, DOB 1956/01/30, MRN [id number] ? ?PCP:  Plotnikov, Georgina QuintAleksei V, MD ?  ?[facility] Providers ?Cardiologist:  Donato SchultzMark Skains, MD    ? ?Referring MD: [name], [name], MD  ? ?Chief Complaint: follow-up right side chest pain "feels full"  ? ?History of Present Illness:   ? ?Jacob Gordon is a 66 y.o. male with a hx of CAD, diabetes, hypertension, hyperlipidemia, chronic migraine, and GERD.  ? ?He was referred to cardiology by Dr. Posey ReaPlotnikov for shortness of breath and seen by Dr. Anne FuSkains on 12/02/2017. Echo revealed LVEF 55-60%.  Coronary CTA revealed calcium score 493, 89th percentile for age and sex matched controlled, mild CAD in the RCA, LCx and proximal LAD, moderate CAD in the mid LAD and D1, severe stenosis in the ostial D2.  FFR showed hemodynamically significant stenosis in distal LAD.  Cardiac catheterization revealed moderate nonobstructive CAD, no flow-limiting lesions seen in the RCA, circumflex or LAD, severe stenosis in a small caliber branch too small for stenting but significant enough to cause angina, normal LV systolic function.  ? ?He has maintained consistent follow-up and was last seen in our office on 05/16/2021 by Dr. Anne FuSkains.  He had stopped his statin due to starting Rybelsus, was encouraged to restart atorvastatin and 1 year follow-up was recommended. CXR was ordered for bony prominence at subxiphoid region which was normal.  ? ?He presented to [facility] ED on 12/24/2021 with right-sided chest pain. Pain described as sharp, stabbing, worse with movement and lifting arm, normal hs troponin x 2, EKG unchanged from previous, advised to follow-up as OP. ? ?Today, he is here alone for follow-up of right sided chest pain. States pain came on suddenly approximately 6 weeks ago, initially as mild discomfort, not severe. Able to sleep without problem. 2 days later, pain intensified and in the early hours of the 3rd day,  pain was 8/10, accompanied by nausea and he went to ED. Testing in ED all normal. He additionally had a kidney stone around this time. Pain was different with stone and completely resolved after it passed. Today, patient notes discomfort in right chest area near axilla that feels like "fullness."  It does not intensify with movement. He has not been able to reproduce the pain. Pain does not intensify with exertion, he walks on occasion for exercise.  He cannot recall any abnormal movements, injury, weight lifting or other explanation for the pain. He denies shortness of breath, lower extremity edema, fatigue, palpitations, melena, hematuria, hemoptysis, diaphoresis, weakness, presyncope, syncope, orthopnea, and PND. He was previously prescribed Imdur but did not start it because he does not feel that he can stop Cialis due to BPH, states he had stress incontinence prior to taking it.  ? ? ?Past Medical History:  ?Diagnosis Date  ? ALLERGIC RHINITIS   ? Chronic headaches   ? migraines  ? Complication of anesthesia   ? violent vomiting   ? Coronary artery disease   ? non obstructive  ? DEGENERATIVE DISC DISEASE   ? DIABETES MELLITUS, TYPE II   ? type 2  ? GERD   ? History of kidney stones   ? HLD (hyperlipidemia)   ? HYPERTENSION   ? Internal hemorrhoids   ? OSTEOARTHRITIS   ? PONV (postoperative nausea and vomiting)   ? Sleep apnea   ? no cpap mild case  ? THROMBOCYTOPENIA   ? ? ?Past Surgical History:  ?  Procedure Laterality Date  ? COLONOSCOPY  10/01/11  ? internal hemorrhoids  ? LEFT HEART CATH AND CORONARY ANGIOGRAPHY N/A 01/07/2018  ? Procedure: LEFT HEART CATH AND CORONARY ANGIOGRAPHY;  Surgeon: Kathleene Hazel, MD;  Location: [facility];  Service: Cardiovascular;  Laterality: N/A;  ? SHOULDER SURGERY    ? right rotator cuff surg  ? TONSILLECTOMY AND ADENOIDECTOMY    ? TOTAL HIP ARTHROPLASTY Right 07/28/2019  ? Procedure: TOTAL HIP ARTHROPLASTY ANTERIOR APPROACH;  Surgeon: Ollen Gross, MD;   Location: [facility];  Service: Orthopedics;  Laterality: Right;   ? VASECTOMY    ? ? ?Current Medications: ?Current Meds  ?Medication Sig  ? aspirin EC 81 MG tablet Take 81 mg by mouth daily.  ? atorvastatin (LIPITOR) 80 MG tablet TAKE 1 TABLET(80 MG) BY MOUTH DAILY  ? Cholecalciferol (VITAMIN D-3) 125 MCG (5000 UT) TABS Take 5,000 Units by mouth daily.  ? Galcanezumab-gnlm (EMGALITY) 120 MG/ML SOAJ Inject 120 mg into the skin every 30 (thirty) days.  ? HYDROcodone-acetaminophen (NORCO) 5-325 MG tablet Take 1 tablet by mouth every 4 (four) hours as needed for severe pain (Renal colic).  ? icosapent Ethyl (VASCEPA) 1 g capsule Take 2 capsules (2 g total) by mouth 2 (two) times daily.  ? meloxicam (MOBIC) 15 MG tablet Take 1 tablet (15 mg total) by mouth daily as needed for pain.  ? methocarbamol (ROBAXIN) 500 MG tablet Take 1 tablet (500 mg total) by mouth every 6 (six) hours as needed for muscle spasms.  ? OVER THE COUNTER MEDICATION Take 1 capsule by mouth daily. Suprema Dophilus Supplement - probiotic  ? oxyCODONE (ROXICODONE) 5 MG immediate release tablet Take 1 tablet (5 mg total) by mouth every 4 (four) hours as needed for severe pain.  ? pantoprazole (PROTONIX) 40 MG tablet TAKE 1 TABLET BY MOUTH DAILY  ? tadalafil (CIALIS) 5 MG tablet TAKE ONE TABLET BY MOUTH DAILY  ? tamsulosin (FLOMAX) 0.4 MG CAPS capsule Take 1 capsule (0.4 mg total) by mouth daily as needed. Renal colic  ? Ubrogepant (UBRELVY) 100 MG TABS Take 100 mg by mouth every 2 (two) hours as needed. Maximum 200mg  a day.  ? [DISCONTINUED] amLODipine (NORVASC) 10 MG tablet TAKE 1 TABLET BY MOUTH EVERY DAY. NEEDS OFFICE VISIT FOR FURTHER REFILLS.  ? [DISCONTINUED] carvedilol (COREG) 25 MG tablet Take 1 tablet (25 mg total) by mouth 2 (two) times daily with a meal.  ? [DISCONTINUED] Galcanezumab-gnlm (EMGALITY) 120 MG/ML SOAJ Inject 120 mg into the skin every 30 (thirty) days.  ? [DISCONTINUED] terazosin (HYTRIN) 10 MG capsule Take 10 mg by mouth  at bedtime.  ?  ? ?Allergies:   Tape, Sulfa antibiotics, Valsartan, and Pravastatin sodium  ? ?Social History  ? ?Socioeconomic History  ? Marital status: Married  ?  Spouse name: Not on file  ? Number of children: 1  ? Years of education: Not on file  ? Highest education level: Not on file  ?Occupational History  ? Occupation:   ?Tobacco Use  ? Smoking status: Never  ? Smokeless tobacco: Never  ?Vaping Use  ? Vaping Use: Never used  ?Substance and Sexual Activity  ? Alcohol use: No  ? Drug use: No  ? Sexual activity: Yes  ?Other Topics Concern  ? Not on file  ?Social History Narrative  ? Caffeine daily   ? ?Social Determinants of Health  ? ?Financial Resource Strain: Not on file  ?Food Insecurity: Not on file  ?Transportation  Needs: Not on file  ?Physical Activity: Not on file  ?Stress: Not on file  ?Social Connections: Not on file  ?  ? ?Family History: ?The patient's family history includes Diabetes in his sister. There is no history of Colon cancer. ? ?ROS:   ?Please see the history of present illness.    ?+ right side chest pain ?All other systems reviewed and are negative. ? ?Labs/Other Studies Reviewed:   ? ?The following studies were reviewed today: ? ?Prox RCA lesion is 40% stenosed. ?Mid RCA lesion is 40% stenosed. ?Dist RCA lesion is 40% stenosed. ?Ost RPDA to RPDA lesion is 30% stenosed. ?Prox Cx to Mid Cx lesion is 30% stenosed. ?Ost 2nd Mrg to 2nd Mrg lesion is 30% stenosed. ?3rd Mrg lesion is 30% stenosed. ?Ost 3rd Diag lesion is 50% stenosed. ?Ost 1st Diag lesion is 99% stenosed. ?Dist LM to Prox LAD lesion is 20% stenosed. ?Mid LAD lesion is 30% stenosed. ?Dist LAD lesion is 60% stenosed. ?The left ventricular systolic function is normal. ?LV end diastolic pressure is normal. ?The left ventricular ejection fraction is 55-65% by visual estimate. ?There is no mitral valve regurgitation. ?  ?1. Moderate non-obstructive CAD ?2. There are no flow limiting lesions seen in the RCA,  Circumflex or LAD ?3. There is a small caliber branch that arises as a septal perforator and then courses to the lateral wall in the distribution of a diagonal branch. There is a severe stenosis in this branch. The

## 2022-02-05 ENCOUNTER — Ambulatory Visit: Payer: Medicare Other | Admitting: Nurse Practitioner

## 2022-02-05 ENCOUNTER — Encounter: Payer: Self-pay | Admitting: Nurse Practitioner

## 2022-02-05 VITALS — BP 130/80 | HR 81 | Ht 69.0 in | Wt 211.4 lb

## 2022-02-05 DIAGNOSIS — I10 Essential (primary) hypertension: Secondary | ICD-10-CM

## 2022-02-05 DIAGNOSIS — R079 Chest pain, unspecified: Secondary | ICD-10-CM

## 2022-02-05 DIAGNOSIS — E785 Hyperlipidemia, unspecified: Secondary | ICD-10-CM | POA: Diagnosis not present

## 2022-02-05 DIAGNOSIS — I2510 Atherosclerotic heart disease of native coronary artery without angina pectoris: Secondary | ICD-10-CM

## 2022-02-05 MED ORDER — CARVEDILOL 12.5 MG PO TABS: 12.5000 mg | ORAL_TABLET | Freq: Two times a day (BID) | ORAL | 3 refills | Status: DC

## 2022-02-05 MED ORDER — AMLODIPINE BESYLATE 10 MG PO TABS: ORAL_TABLET | ORAL | 3 refills | Status: DC

## 2022-02-05 NOTE — Patient Instructions (Signed)
Medication Instructions:  ? ?CHANGE Coreg one (1) tablet by mouth ( 12.5 mg) twice daily. ? ?*If you need a refill on your cardiac medications before your next appointment, please call your pharmacy* ? ? ? ?Testing/Procedures: ?You are scheduled for a Myocardial Perfusion Imaging Study on Tuesday, April 18 at 10:30 am.  ? ?Please arrive 15 minutes prior to your appointment time for registration and insurance purposes.  ? ?The test will take approximately 3 to 4 hours to complete; you may bring reading material. If someone comes with you to your appointment, they will need to remain in the main lobby due to limited space in the testing area.  ? ?How to prepare for your Myocardial Perfusion test: ? ? Do not eat or drink 3 hours prior to your test, except you may have water.  ? ? Do not consume products containing caffeine (regular or decaffeinated) 12 hours prior to your test (ex: coffee, chocolate, soda, tea) ? ? Do bring a list of your current medications with you. If not listed below, you may take your medications as normal.  ? ? Do not take Coreg for 24 hours prior to the test. Or the am of test.  ? ?Bring any held medication to your appointment, as you may be required to take it once the test is complete.  ? ?Do wear comfortable clothes (no  overalls) and walking shoes. Tennis shoes are preferred. No open toed shoes. ? ?Do not wear cologne, aftershave or lotions (deodorant is allowed).  ? ?If these instructions are not followed, you test will have to be rescheduled.  ? ?Please report to [address] for your test. If you have questions or concerns about your appointment, please call the Nuclear Lab at [phone]. ? ?If you cannot keep your appointment, please provide 24 hour notification to the Nuclear lab to avoid a possible $50 charge to your account.  ? ? Follow-Up: ?At [facility], you and your health needs are our priority.  As part of our continuing mission to provide you with  exceptional heart care, we have created designated Provider Care Teams.  These Care Teams include your primary Cardiologist (physician) and Advanced Practice Providers (APPs -  Physician Assistants and Nurse Practitioners) who all work together to provide you with the care you need, when you need it. ? ?We recommend signing up for the patient portal called "MyChart".  Sign up information is provided on this After Visit Summary.  MyChart is used to connect with patients for Virtual Visits (Telemedicine).  Patients are able to view lab/test results, encounter notes, upcoming appointments, etc.  Non-urgent messages can be sent to your provider as well.   ?To learn more about what you can do with MyChart, go to [email or web address].   ? ?Your next appointment:   ?3 month(s) ? ?The format for your next appointment:   ?In Person ? ?Provider:   ?Candee Furbish, MD   ? ? ?

## 2022-02-08 ENCOUNTER — Telehealth: Payer: Self-pay | Admitting: Cardiology

## 2022-02-08 MED ORDER — ATORVASTATIN CALCIUM 80 MG PO TABS: ORAL_TABLET | ORAL | 3 refills | Status: DC

## 2022-02-08 NOTE — Telephone Encounter (Signed)
Pt's Rx was faxed to the [facility] pharmacy in [place] at [phone]. Confirmation received.  ?

## 2022-02-08 NOTE — Telephone Encounter (Signed)
? ?*  STAT* If patient is at the pharmacy, call can be transferred to refill team. ? ? ?1. Which medications need to be refilled? (please list name of each medication and dose if known) atorvastatin (LIPITOR) 80 MG tablet ? ?2. Which pharmacy/location (including street and city if local pharmacy) is medication to be sent to?[facility] - [place], [place] - [address] ?Fax# [phone]  ? ?3. Do they need a 30 day or 90 day supply? 90 days ? ?Pt said, he needs prescription and need to fax it to today at [facility] pharmacy fax# [phone] and ATTN: PA Mendel Corning ?

## 2022-02-12 ENCOUNTER — Other Ambulatory Visit: Payer: Self-pay

## 2022-02-12 ENCOUNTER — Telehealth (HOSPITAL_COMMUNITY): Payer: Self-pay | Admitting: *Deleted

## 2022-02-12 MED ORDER — TADALAFIL 5 MG PO TABS: ORAL_TABLET | ORAL | 3 refills | Status: DC

## 2022-02-12 NOTE — Telephone Encounter (Signed)
Patient given detailed instructions per Myocardial Perfusion Study Information Sheet for the test on 02/19/22 at 1030. Patient notified to arrive 15 minutes early and that it is imperative to arrive on time for appointment to keep from having the test rescheduled. ? If you need to cancel or reschedule your appointment, please call the office within 24 hours of your appointment. . Patient verbalized understanding.Earla Charlie, Ranae Palms ? ? ?

## 2022-02-19 ENCOUNTER — Encounter (HOSPITAL_COMMUNITY): Payer: Self-pay

## 2022-02-19 ENCOUNTER — Telehealth: Payer: Self-pay | Admitting: *Deleted

## 2022-02-19 ENCOUNTER — Ambulatory Visit (HOSPITAL_COMMUNITY): Payer: Medicare Other

## 2022-02-19 NOTE — Telephone Encounter (Signed)
Rec'd PA for Sildenafil on cover-my-meds w/ Key: [id number]. PA sent to insurance waiting on insurance coverage.Marland KitchenRaechel Gordon ?

## 2022-02-20 NOTE — Telephone Encounter (Signed)
Check cover-my-meds rec'd msg stating " This request has been approved. [employer]. Effective from 02/19/2022 through 02/20/2023.../lmb ?

## 2022-03-03 ENCOUNTER — Encounter: Payer: Self-pay | Admitting: Internal Medicine

## 2022-03-05 ENCOUNTER — Encounter: Payer: Self-pay | Admitting: Internal Medicine

## 2022-03-07 ENCOUNTER — Encounter: Payer: Self-pay | Admitting: Neurology

## 2022-03-07 ENCOUNTER — Encounter: Payer: Self-pay | Admitting: Internal Medicine

## 2022-03-07 MED ORDER — MELOXICAM 15 MG PO TABS: 15.0000 mg | ORAL_TABLET | Freq: Every day | ORAL | 1 refills | Status: DC | PRN

## 2022-03-07 MED ORDER — ICOSAPENT ETHYL 1 G PO CAPS
2.0000 g | ORAL_CAPSULE | Freq: Two times a day (BID) | ORAL | 1 refills | Status: DC
Start: 2022-03-07 — End: 2023-04-22

## 2022-03-07 MED ORDER — TADALAFIL 5 MG PO TABS: ORAL_TABLET | ORAL | 1 refills | Status: DC

## 2022-03-07 MED ORDER — TAMSULOSIN HCL 0.4 MG PO CAPS: 0.4000 mg | ORAL_CAPSULE | Freq: Every day | ORAL | 1 refills | Status: AC | PRN

## 2022-03-08 ENCOUNTER — Other Ambulatory Visit: Payer: Self-pay | Admitting: *Deleted

## 2022-03-08 MED ORDER — CARVEDILOL 12.5 MG PO TABS: 12.5000 mg | ORAL_TABLET | Freq: Two times a day (BID) | ORAL | 3 refills | Status: DC

## 2022-03-08 MED ORDER — AMLODIPINE BESYLATE 10 MG PO TABS: ORAL_TABLET | ORAL | 3 refills | Status: DC

## 2022-03-08 NOTE — Telephone Encounter (Signed)
Sent in new script for pt to place on hold due to changing from mail order to local pharmacy.  ?

## 2022-03-13 ENCOUNTER — Encounter: Payer: Self-pay | Admitting: Neurology

## 2022-03-13 DIAGNOSIS — G43709 Chronic migraine without aura, not intractable, without status migrainosus: Secondary | ICD-10-CM

## 2022-03-14 MED ORDER — UBRELVY 100 MG PO TABS: 100.0000 mg | ORAL_TABLET | ORAL | 11 refills | Status: DC | PRN

## 2022-03-14 NOTE — Telephone Encounter (Signed)
Faxed last ofv note and copy of escript that was sent to 01-31-22 for ubrelvy to [email or web address]  [phone] to Elisabeth Most, or Kipp Brood.  If they need new rx they are to contact me again.  ?

## 2022-03-19 NOTE — Telephone Encounter (Signed)
I called and spoke to pt.  He feels that things have been fixed at the [place] for him and they have the information.  If needs anything else will then be back in touch.  I relayed that will keep the prescription (paper) and last ofv notes if they need in my file if needed.  He appreciated call back.   ?

## 2022-03-27 ENCOUNTER — Telehealth: Payer: Self-pay | Admitting: *Deleted

## 2022-03-27 NOTE — Telephone Encounter (Signed)
Received fax from [facility] system re: PA drug request for [employer]. Questions answered and faxed back with copy of last office note. Sent to attention Verdon Cummins, [facility], f [phone].  Received confirmation.

## 2022-04-03 ENCOUNTER — Encounter: Payer: Self-pay | Admitting: Family Medicine

## 2022-04-03 ENCOUNTER — Ambulatory Visit (INDEPENDENT_AMBULATORY_CARE_PROVIDER_SITE_OTHER): Payer: Medicare Other | Admitting: Family Medicine

## 2022-04-03 VITALS — BP 148/96 | HR 90 | Temp 97.8°F | Ht 69.0 in | Wt 209.0 lb

## 2022-04-03 DIAGNOSIS — R0981 Nasal congestion: Secondary | ICD-10-CM

## 2022-04-03 DIAGNOSIS — J3489 Other specified disorders of nose and nasal sinuses: Secondary | ICD-10-CM | POA: Diagnosis not present

## 2022-04-03 DIAGNOSIS — J029 Acute pharyngitis, unspecified: Secondary | ICD-10-CM

## 2022-04-03 DIAGNOSIS — I10 Essential (primary) hypertension: Secondary | ICD-10-CM

## 2022-04-03 DIAGNOSIS — R067 Sneezing: Secondary | ICD-10-CM | POA: Diagnosis not present

## 2022-04-03 LAB — POC COVID19 BINAXNOW: SARS Coronavirus 2 Ag: NEGATIVE

## 2022-04-03 MED ORDER — AMOXICILLIN 875 MG PO TABS: 875.0000 mg | ORAL_TABLET | Freq: Two times a day (BID) | ORAL | 0 refills | Status: AC

## 2022-04-03 MED ORDER — FLUTICASONE PROPIONATE 50 MCG/ACT NA SUSP: 2.0000 | Freq: Every day | NASAL | 1 refills | Status: DC

## 2022-04-03 NOTE — Progress Notes (Signed)
Subjective:  Jacob Gordon is a 66 y.o. male who presents for a 3 day history of worsening nasal congestion, sinus pressure, post nasal drainage, sore throat and sneezing.   Negative Covid at home.   Denies fever, chills, dizziness, body aches, chest pain, palpitations, shortness of breath, cough, abdominal pain, N/V/D.   Treatment to date: multi-symptom cold and flu medication. No antihistamine.   Denies sick contacts.  No other aggravating or relieving factors.  No other c/o.  ROS as in subjective.   Objective: Vitals:   04/03/22 1617  BP: (!) 148/96  Pulse: 90  Temp: 97.8 F (36.6 C)  SpO2: 98%    General appearance: Alert, WD/WN, no distress, mildly ill appearing                             Skin: warm, no rash                           Head: maxillary sinus tenderness                            Eyes: conjunctiva normal, corneas clear, PERRLA                            Ears: pearly TMs, external ear canals normal                          Nose: septum midline, turbinates swollen, with erythema and clear discharge             Mouth/throat: MMM, tongue normal, mild pharyngeal erythema                           Neck: supple, no adenopathy, no thyromegaly, nontender                          Heart: RRR                         Lungs: CTA bilaterally, no wheezes, rales, or rhonchi      Assessment: Nasal congestion with rhinorrhea - Plan: POC COVID-19, fluticasone (FLONASE) 50 MCG/ACT nasal spray  Sneezing - Plan: POC COVID-19  Sinus pressure - Plan: fluticasone (FLONASE) 50 MCG/ACT nasal spray  Acute pharyngitis, unspecified etiology - Plan: POC COVID-19  Elevated blood pressure reading in office with diagnosis of hypertension   Plan: Covid negative Discussed diagnosis and treatment of URI. Recommend symptomatic treatment and may take antibiotic prescribed today if he is worsening or not improving over the next 2-3 days. Flonase prescribed. Consider avoiding  decongestant due to HTN. BP is elevated today.  Suggested symptomatic OTC remedies, Zyrtec and Mucinex. Salt water gargles to fore throat.  Nasal saline spray for congestion.  Tylenol or Ibuprofen OTC for fever and malaise.      Follow up prn.

## 2022-04-03 NOTE — Patient Instructions (Addendum)
You appear to have a viral illness.   I recommend treating your symptoms with an over-the-counter allergy medication such as Zyrtec, Flonase nasal spray, Tylenol and salt water gargles for sore throat.   If you develop chest congestion or cough then you may want to try Mucinex over-the-counter.   Stay well-hydrated.  If you are getting worse over the next 2 to 3 days or not improving then I recommend starting the antibiotic, amoxicillin.

## 2022-04-04 ENCOUNTER — Encounter: Payer: Self-pay | Admitting: Family Medicine

## 2022-04-04 NOTE — Telephone Encounter (Signed)
Pt has developed a cough overnight and has started taking cough drops. He would like your recommendation to see if he should start the amoxicillin or if you have any other thoughts of what he should do?

## 2022-04-10 ENCOUNTER — Encounter: Payer: Self-pay | Admitting: *Deleted

## 2022-04-10 ENCOUNTER — Other Ambulatory Visit: Payer: Self-pay | Admitting: *Deleted

## 2022-04-10 DIAGNOSIS — G43709 Chronic migraine without aura, not intractable, without status migrainosus: Secondary | ICD-10-CM

## 2022-04-10 MED ORDER — UBRELVY 50 MG PO TABS: ORAL_TABLET | ORAL | 11 refills | Status: DC

## 2022-04-10 MED ORDER — UBRELVY 100 MG PO TABS: 50.0000 mg | ORAL_TABLET | ORAL | 11 refills | Status: DC | PRN

## 2022-04-10 NOTE — Telephone Encounter (Signed)
This was handled in other encounter. Had to switch to Ubrelvy 50 mg instead of 100 mg.

## 2022-04-10 NOTE — Progress Notes (Signed)
Spoke with [facility] called spoke with pharmacy. Had to change dosage for Ubrevly to 50 mg  due to interaction with carvedilol . Per Pharmacist and Dr Lucia Gaskins ok to change dosage

## 2022-04-10 NOTE — Addendum Note (Signed)
Addended by: Raynald Kemp A on: 04/10/2022 10:10 AM   Modules accepted: Orders

## 2022-04-10 NOTE — Telephone Encounter (Signed)
Per my chart from patient Jacob Gordon has been approved by [place]. He has not received medication yet, stated they are checking to see if any interaction with his carvedilol.

## 2022-04-17 ENCOUNTER — Encounter: Payer: Self-pay | Admitting: Cardiology

## 2022-04-17 MED ORDER — ATORVASTATIN CALCIUM 80 MG PO TABS: ORAL_TABLET | ORAL | 3 refills | Status: DC

## 2022-04-21 IMAGING — CT CT RENAL STONE PROTOCOL
2 of 4 series · 15 of 46 positions shown, 17 images · non-contrast
Comparison: CT with IV contrast 06/12/2015

CLINICAL DATA: Left renal colic.



[Series 2: stone study 5.0 br38 1 · axial · 0.74mm/px · z∈[-410,-35]mm · 12 of 86 slices shown, 14 images]
[im 7/86  soft-tissue]
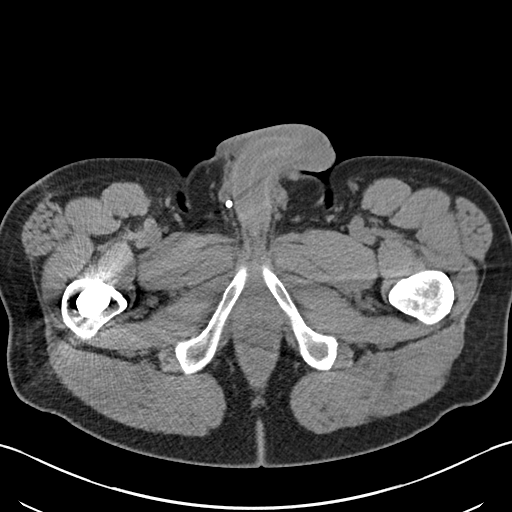
[im 7/86  bone]
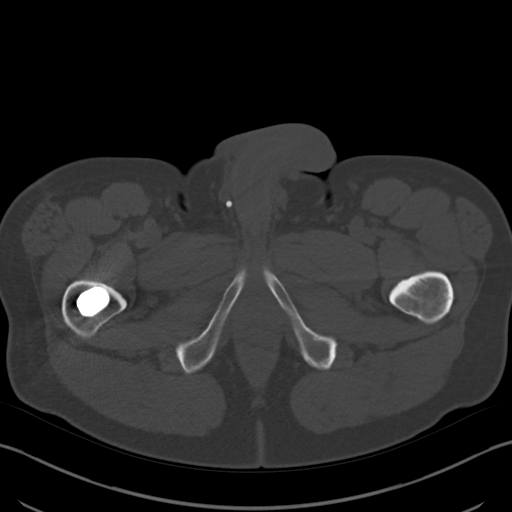
[im 14/86  soft-tissue]
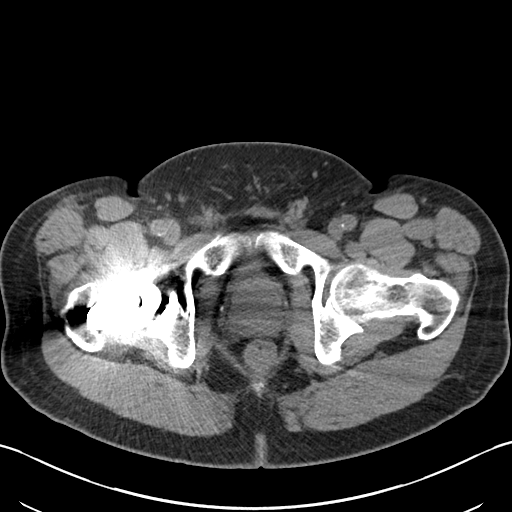
[im 21/86  soft-tissue]
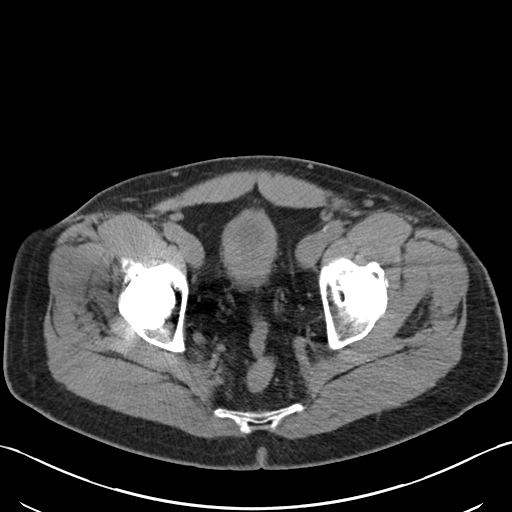
[im 28/86  soft-tissue]
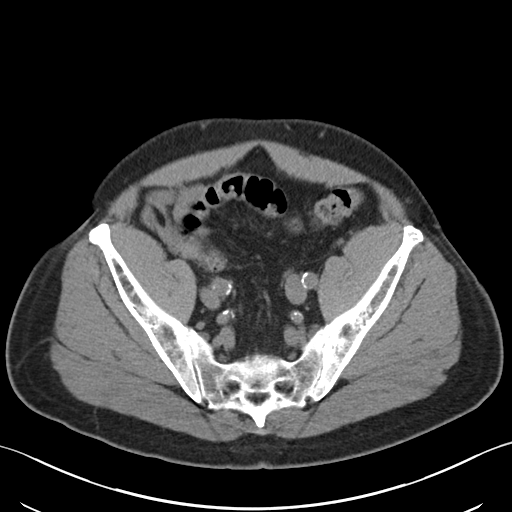
[im 35/86  soft-tissue]
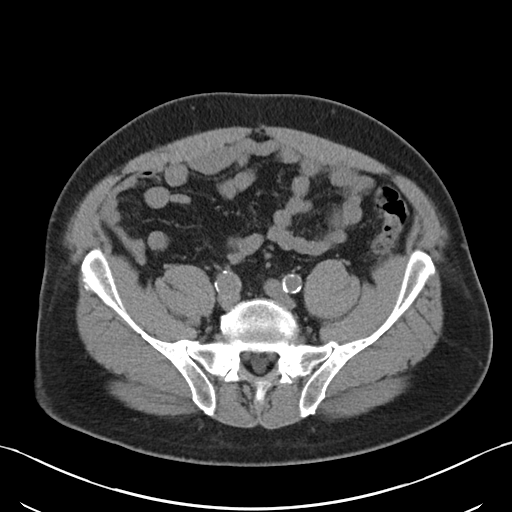
[im 41/86  soft-tissue]
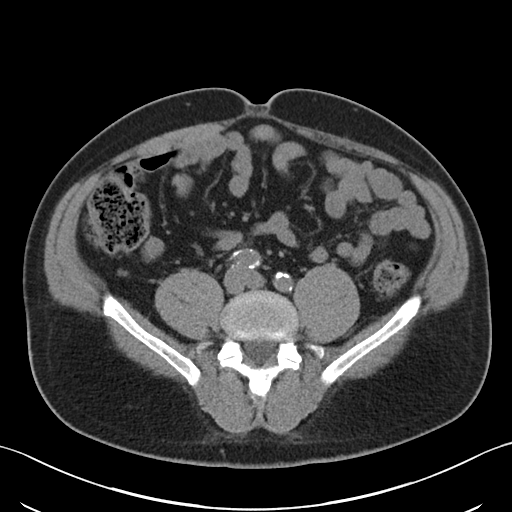
[im 48/86  soft-tissue]
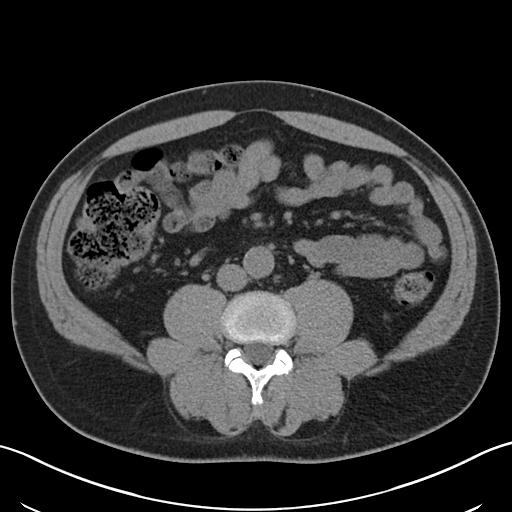
[im 55/86  soft-tissue]
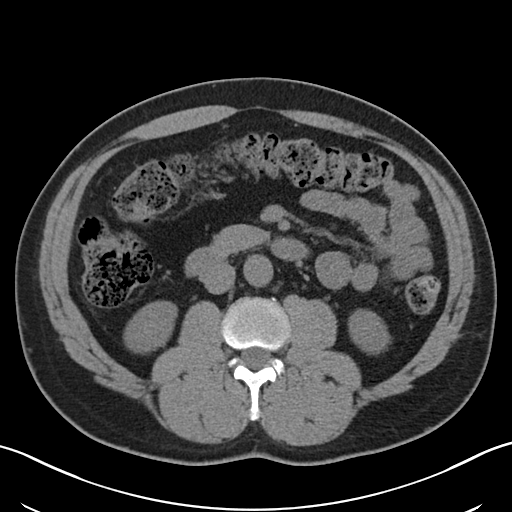
[im 62/86  soft-tissue]
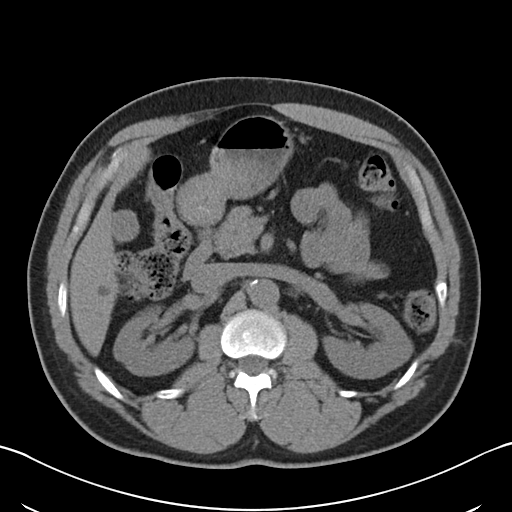
[im 62/86  bone]
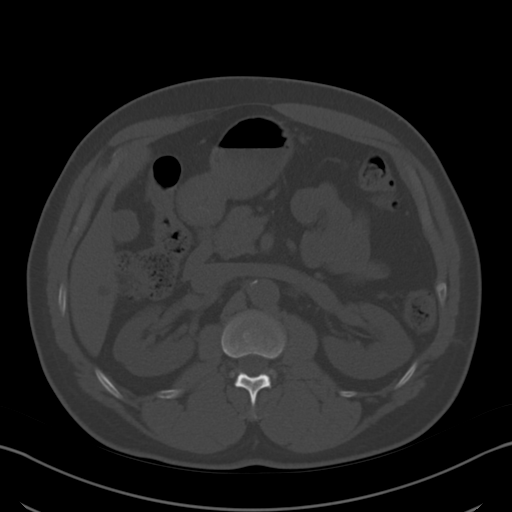
[im 69/86  soft-tissue]
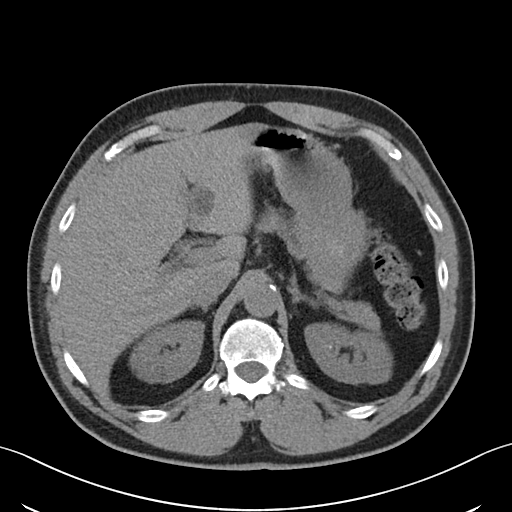
[im 75/86  soft-tissue]
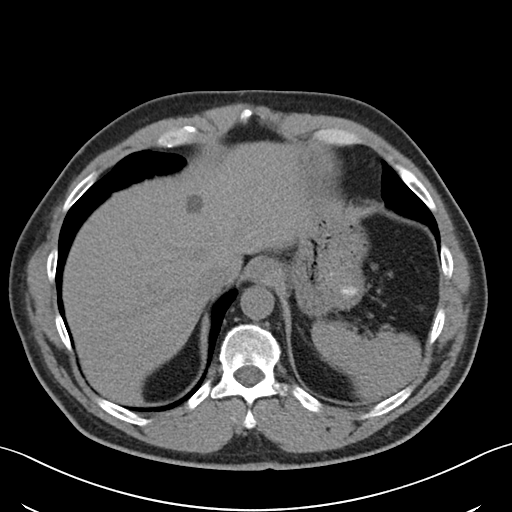
[im 82/86  soft-tissue]
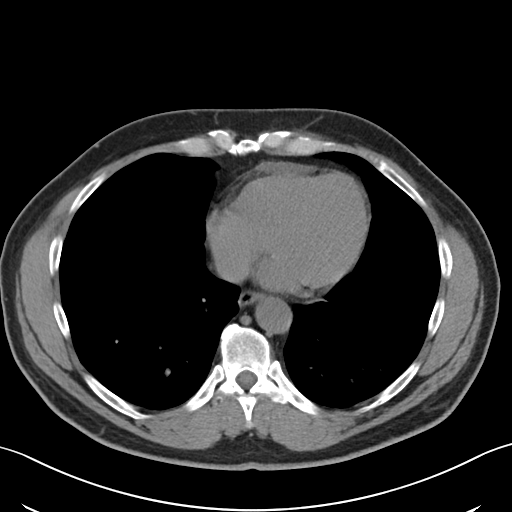

[Series 5: coronal soft tissue · coronal · 0.72mm/px · 3 of 89 slices shown]
[im 30/89  soft-tissue]
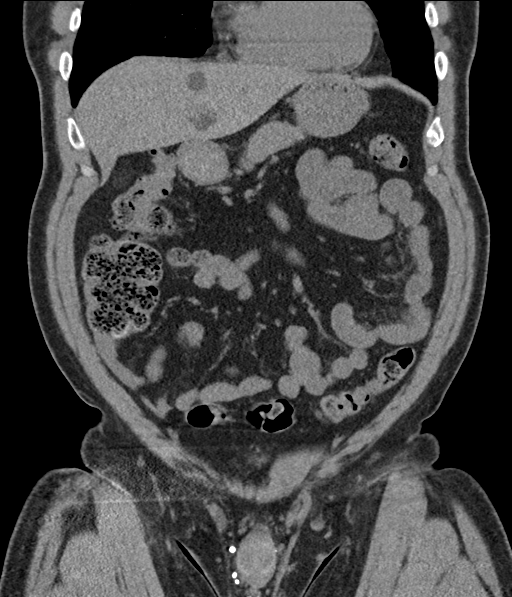
[im 40/89  soft-tissue]
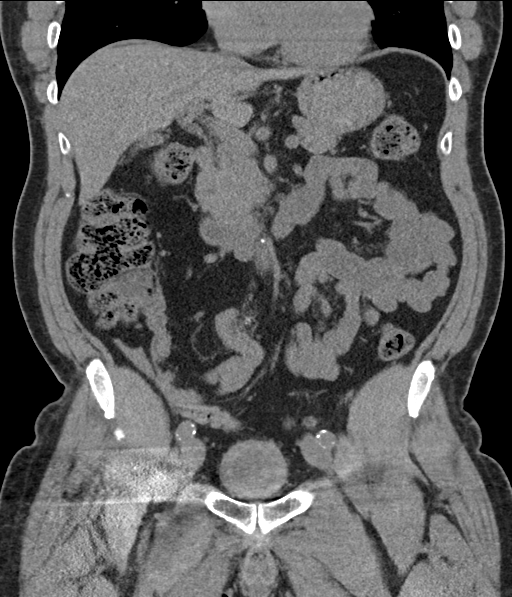
[im 49/89  soft-tissue]
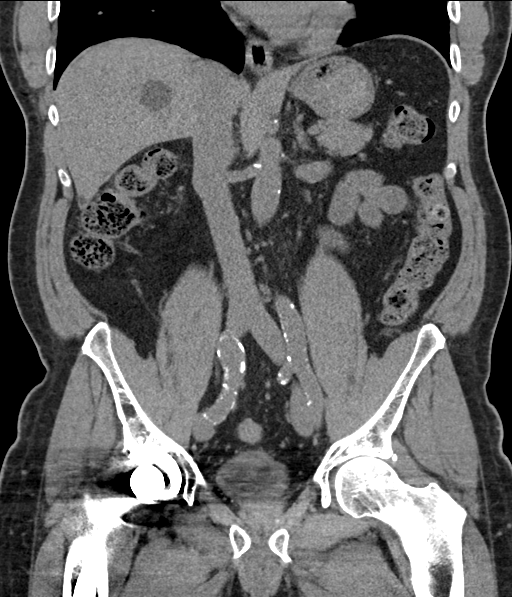

[15 of 46 positions shown; findings below may reference images not displayed]

FINDINGS: Lower chest: There is left lower lobe linear scarring or
atelectasis. There are no lung base infiltrates. The cardiac size is
normal. Small chronic pericardial effusion is noted anteriorly and
calcification in the distal LAD and right coronary arteries.

Hepatobiliary: Several scattered hepatic cysts. Largest in the right
lobe is 2.7 cm and 7.4 Hounsfield units. Largest in the left lobe is
2.3 cm in 7.3 Hounsfield units. Some were present previously and
larger than previously and some are new. There are additional too
small to characterize scattered hypodensities. Gallbladder and bile
ducts are unremarkable.

Pancreas: Unremarkable without contrast.

Spleen: Unremarkable without contrast.

Adrenals/Urinary Tract: No adrenal mass is seen. Small cyst again
noted posteriorly in the upper pole left kidney. The renal cortex
otherwise unremarkable without contrast.

There are several scattered punctate nonobstructive caliceal stones
in the upper to midpole right kidney, and scattered punctate up to
1-2 mm stones in the left renal collecting system.

No hydronephrosis or ureteral stones are seen although please note
the distal right ureter below the level of the acetabulum is
obscured by metal artifact from a right hip arthroplasty.

Bladder wall is thickened which was seen previously, but is not
fully distended.

Stomach/Bowel: No dilatation or wall thickening including the
appendix. Moderate fecal stasis. There are colonic diverticula
without evidence of colitis or diverticulitis.

Vascular/Lymphatic: There is moderate aortoiliac calcific plaque.
There is no AAA but there is ectasia in the common iliac arteries,
which measure 1.7 cm on the right and 1.4 cm on the left. Scattered
calcific plaque in the branch arteries.

Reproductive: Enlarged prostate, measuring 4.5 cm with bladder
impression.

Other: Small umbilical fat hernia. No incarcerated hernia. No free
air, hemorrhage or fluid.

Musculoskeletal: There are chronic L5 pars defects without L5-S1
spondylolisthesis. No acute or significant osseous findings. Right
hip arthroplasty. Left hip DJD.
IMPRESSION: 1. There is nonobstructive micronephrolithiasis. No hydronephrosis
or ureteral stone is seen, but with the most distal right ureter
obscured by his right hip arthroplasty.
2. Thickened bladder which could be due to nondistention,
hypertrophy or cystitis.
3. Prostatomegaly.
4. Aortic and coronary artery atherosclerosis.
5. Hepatic cysts and additional too small to characterize
hypodensities.
6. Constipation with diverticulosis.
7. Umbilical fat hernia.

## 2022-04-22 ENCOUNTER — Encounter: Payer: Self-pay | Admitting: Internal Medicine

## 2022-04-23 ENCOUNTER — Other Ambulatory Visit (INDEPENDENT_AMBULATORY_CARE_PROVIDER_SITE_OTHER): Payer: Medicare Other

## 2022-04-23 DIAGNOSIS — I2510 Atherosclerotic heart disease of native coronary artery without angina pectoris: Secondary | ICD-10-CM

## 2022-04-23 DIAGNOSIS — E1121 Type 2 diabetes mellitus with diabetic nephropathy: Secondary | ICD-10-CM | POA: Diagnosis not present

## 2022-04-23 DIAGNOSIS — I159 Secondary hypertension, unspecified: Secondary | ICD-10-CM | POA: Diagnosis not present

## 2022-04-23 DIAGNOSIS — E785 Hyperlipidemia, unspecified: Secondary | ICD-10-CM

## 2022-04-23 LAB — LIPID PANEL
Cholesterol: 116 mg/dL (ref 0–200)
HDL: 40.6 mg/dL (ref >39.00)
LDL Cholesterol: 63 mg/dL (ref 0–99)
NonHDL: 75.23
Total CHOL/HDL Ratio: 3
Triglycerides: 63 mg/dL (ref 0.0–149.0)
VLDL: 12.6 mg/dL (ref 0.0–40.0)

## 2022-04-23 LAB — COMPREHENSIVE METABOLIC PANEL
ALT: 36 U/L (ref 0–53)
AST: 24 U/L (ref 0–37)
Albumin: 4 g/dL (ref 3.5–5.2)
Alkaline Phosphatase: 96 U/L (ref 39–117)
BUN: 11 mg/dL (ref 6–23)
CO2: 30 mEq/L (ref 19–32)
Calcium: 9.2 mg/dL (ref 8.4–10.5)
Chloride: 104 mEq/L (ref 96–112)
Creatinine, Ser: 1.17 mg/dL (ref 0.40–1.50)
GFR: 65.44 mL/min (ref >60.00)
Glucose, Bld: 149 mg/dL — ABNORMAL HIGH (ref 70–99)
Potassium: 3.6 mEq/L (ref 3.5–5.1)
Sodium: 139 mEq/L (ref 135–145)
Total Bilirubin: 1.1 mg/dL (ref 0.2–1.2)
Total Protein: 6.9 g/dL (ref 6.0–8.3)

## 2022-04-23 LAB — HEMOGLOBIN A1C: Hgb A1c MFr Bld: 8 % — ABNORMAL HIGH (ref 4.6–6.5)

## 2022-05-09 ENCOUNTER — Encounter: Payer: Self-pay | Admitting: Internal Medicine

## 2022-05-16 ENCOUNTER — Ambulatory Visit: Payer: Medicare Other | Admitting: Cardiology

## 2022-05-16 ENCOUNTER — Encounter: Payer: Self-pay | Admitting: Cardiology

## 2022-05-16 DIAGNOSIS — E7800 Pure hypercholesterolemia, unspecified: Secondary | ICD-10-CM | POA: Diagnosis not present

## 2022-05-16 DIAGNOSIS — I2510 Atherosclerotic heart disease of native coronary artery without angina pectoris: Secondary | ICD-10-CM | POA: Diagnosis not present

## 2022-05-16 DIAGNOSIS — I10 Essential (primary) hypertension: Secondary | ICD-10-CM

## 2022-05-16 NOTE — Patient Instructions (Signed)

## 2022-05-16 NOTE — Assessment & Plan Note (Signed)
Excellent LDL of 63 less than 70 goal.  Continue with current regimen which includes atorvastatin 80 mg as well as Vascepa.  No myalgias.  ALT 36.

## 2022-05-16 NOTE — Progress Notes (Signed)
Cardiology Office Note:    Date:  05/16/2022   ID:  Jacob Gordon, DOB [birth year], MRN [id number]  PCP:  Cassandria Anger, MD   [facility] Providers Cardiologist:  Candee Furbish, MD     Referring MD: Cassandria Anger, MD    History of Present Illness:    Jacob Gordon is a 66 y.o. male here for the follow up of coronary artery disease, hypertension, and hyperlipidemia.  He followed up with Christen Bame, NP on 02/05/22 regarding his presentation to [facility] on 12/24/2021 with right-sided chest pain. Pain described as sharp, stabbing, worse with movement and lifting arm. He had normal hs troponin x 2, EKG unchanged from previous. He was advised to follow-up as OP. At his follow up he noted discomfort in right chest area near axilla that feels like "fullness."  He was not able to reproduce the pain, and it did not intensify with movement or exertion. Encouraged him to reach out to PCP for further advisement regarding additional imaging. He was also offered a trial of Imdur but he deferred due to severe BPH and cannot be off his Cialis due to significant problems in the past. It was discovered he was taking carvedilol only once daily, so he was instructed to resume this BID.   He has osteoarthritis of the right hip with abnormal FFR in the mid to distal LAD lesion.  His mother and several siblings all had myocardial infarction.  At his last visit he was feeling quite well without any anginal symptoms. On exam, felt like a knot in sub xypiod region. Constant. Epigastric tender to push. No trouble with eating. No change with food. Also he had recently stopped Lipitor.  He believed it may be because he was on Rybelsus.  He was encouraged to continue with statin therapy. CXR was ordered for bony prominence at subxiphoid region which was normal.   Today: He reports that he has been doing fine since his episode of chest pain in February prompting his ED visit. In  retrospect he believes this may have been more musculoskeletal in nature. No recurring chest pain.  For exercise he has been walking 4 times a week.  His LDL is 63; currently on atorvastatin 80 mg.  He denies any palpitations, shortness of breath, or peripheral edema. No lightheadedness, headaches, syncope, orthopnea, or PND.   Past Medical History:  Diagnosis Date   ALLERGIC RHINITIS    Chronic headaches    migraines   Complication of anesthesia    violent vomiting    Coronary artery disease    non obstructive   DEGENERATIVE DISC DISEASE    DIABETES MELLITUS, TYPE II    type 2   GERD    History of kidney stones    HLD (hyperlipidemia)    HYPERTENSION    Internal hemorrhoids    OSTEOARTHRITIS    PONV (postoperative nausea and vomiting)    Sleep apnea    no cpap mild case   THROMBOCYTOPENIA     Past Surgical History:  Procedure Laterality Date   COLONOSCOPY  10/01/11   internal hemorrhoids   LEFT HEART CATH AND CORONARY ANGIOGRAPHY N/A 01/07/2018   Procedure: LEFT HEART CATH AND CORONARY ANGIOGRAPHY;  Surgeon: Burnell Blanks, MD;  Location: [facility];  Service: Cardiovascular;  Laterality: N/A;   SHOULDER SURGERY     right rotator cuff surg   TONSILLECTOMY AND ADENOIDECTOMY     TOTAL HIP ARTHROPLASTY Right 07/28/2019  Procedure: TOTAL HIP ARTHROPLASTY ANTERIOR APPROACH;  Surgeon: Ollen Gross, MD;  Location: [facility];  Service: Orthopedics;  Laterality: Right;    VASECTOMY      Current Medications: Current Meds  Medication Sig   amLODipine (NORVASC) 10 MG tablet TAKE 1 TABLET BY MOUTH EVERY DAY.   aspirin EC 81 MG tablet Take 81 mg by mouth daily.   atorvastatin (LIPITOR) 80 MG tablet TAKE 1 TABLET(80 MG) BY MOUTH DAILY   carvedilol (COREG) 12.5 MG tablet Take 1 tablet (12.5 mg total) by mouth 2 (two) times daily with a meal.   Cholecalciferol (VITAMIN D-3) 125 MCG (5000 UT) TABS Take 5,000 Units by mouth daily.   fluticasone (FLONASE) 50  MCG/ACT nasal spray Place 2 sprays into both nostrils daily.   Galcanezumab-gnlm (EMGALITY) 120 MG/ML SOAJ Inject 120 mg into the skin every 30 (thirty) days.   HYDROcodone-acetaminophen (NORCO) 5-325 MG tablet Take 1 tablet by mouth every 4 (four) hours as needed for severe pain (Renal colic).   icosapent Ethyl (VASCEPA) 1 g capsule Take 2 capsules (2 g total) by mouth 2 (two) times daily.   meloxicam (MOBIC) 15 MG tablet Take 1 tablet (15 mg total) by mouth daily as needed for pain.   methocarbamol (ROBAXIN) 500 MG tablet Take 1 tablet (500 mg total) by mouth every 6 (six) hours as needed for muscle spasms.   OVER THE COUNTER MEDICATION Take 1 capsule by mouth daily. Suprema Dophilus Supplement - probiotic   oxyCODONE (ROXICODONE) 5 MG immediate release tablet Take 1 tablet (5 mg total) by mouth every 4 (four) hours as needed for severe pain.   pantoprazole (PROTONIX) 40 MG tablet TAKE 1 TABLET BY MOUTH DAILY   tadalafil (CIALIS) 5 MG tablet TAKE ONE TABLET BY MOUTH DAILY   tamsulosin (FLOMAX) 0.4 MG CAPS capsule Take 1 capsule (0.4 mg total) by mouth daily as needed. Renal colic   Ubrogepant (UBRELVY) 50 MG TABS Take 50mg  by mouth every 2 hours as needed for migraines   Maximum 100 mg daily     Allergies:   Tape, Sulfa antibiotics, Valsartan, and Pravastatin sodium   Social History   Socioeconomic History   Marital status: Married    Spouse name: Not on file   Number of children: 1   Years of education: Not on file   Highest education level: Not on file  Occupational History   Occupation: Administrative   Tobacco Use   Smoking status: Never   Smokeless tobacco: Never  Vaping Use   Vaping Use: Never used  Substance and Sexual Activity   Alcohol use: No   Drug use: No   Sexual activity: Yes  Other Topics Concern   Not on file  Social History Narrative   Caffeine daily    Social Determinants of Health   Financial Resource Strain: Not on file  Food Insecurity: Not on file   Transportation Needs: Not on file  Physical Activity: Not on file  Stress: Not on file  Social Connections: Not on file     Family History: The patient's family history includes Diabetes in his sister. There is no history of Colon cancer.  ROS:   Please see the history of present illness.    All other systems reviewed and are negative.  EKGs/Labs/Other Studies Reviewed:    The following studies were reviewed today  Vascular ABIs 08/13/2018: No evidence of peripheral vascular disease.  Cath 01/07/18: 1. Moderate non-obstructive CAD 2. There are no flow limiting lesions  seen in the RCA, Circumflex or LAD 3. There is a small caliber branch that arises as a septal perforator and then courses to the lateral wall in the distribution of a diagonal branch. There is a severe stenosis in this branch. The branch is too small for PCI/stenting. 4. Normal LV systolic function   Diagnostic Dominance: Right      Recommendations: I would continue medical management of CAD with aggressive risk factor modification. The small diagonal branch could certainly cause angina. I do not think I would approach this branch with PCI at this time given the size of the vessel.    Coronary CT  12/30/2017: IMPRESSION: 1. Coronary calcium score of 493. This was 53 percentile for age and sex matched control.   2. Normal coronary origin with right dominance.   3. Mild CAD in the RCA, LCX and proximal LAD, moderate CAD in the mid LAD and in D1, severe stenosis in the ostial D2. Additional analysis with CT FFR will be performed.  Echocardiogram  11/28/2017: Study Conclusions   - Left ventricle: The cavity size was normal. Wall thickness was    normal. Systolic function was normal. The estimated ejection    fraction was in the range of 55% to 60%.      EKG:  EKG is personally reviewed. 05/16/2022: EKG was not ordered. 05/16/2021: sinus rhythm low voltage inferior infarct pattern 78 bpm poor R wave  progression noted as well.  Recent Labs: 12/24/2021: Hemoglobin 14.6; Platelets 154 04/23/2022: ALT 36; BUN 11; Creatinine, Ser 1.17; Potassium 3.6; Sodium 139   Recent Lipid Panel    Component Value Date/Time   CHOL 116 04/23/2022 1028   CHOL 169 04/06/2018 0901   TRIG 63.0 04/23/2022 1028   HDL 40.60 04/23/2022 1028   HDL 45 04/06/2018 0901   CHOLHDL 3 04/23/2022 1028   VLDL 12.6 04/23/2022 1028   LDLCALC 63 04/23/2022 1028   LDLCALC 106 (H) 04/06/2018 0901   LDLDIRECT 184.1 10/20/2012 0747     Risk Assessment/Calculations:          Physical Exam:    VS:  BP 110/70 (BP Location: Left Arm, Patient Position: Sitting, Cuff Size: Normal)   Pulse 82   Ht 5\' 9"  (1.753 m)   Wt 211 lb (95.7 kg)   SpO2 95%   BMI 31.16 kg/m     Wt Readings from Last 3 Encounters:  05/16/22 211 lb (95.7 kg)  04/03/22 209 lb (94.8 kg)  02/19/22 211 lb (95.7 kg)     GEN:  Well nourished, well developed in no acute distress HEENT: Normal NECK: No JVD; No carotid bruits LYMPHATICS: No lymphadenopathy CARDIAC: RRR, no murmurs, rubs, gallops RESPIRATORY:  Clear to auscultation without rales, wheezing or rhonchi  ABDOMEN: Soft, non-tender, non-distended MUSCULOSKELETAL:  No edema; No deformity  SKIN: Warm and dry NEUROLOGIC:  Alert and oriented x 3 PSYCHIATRIC:  Normal affect   ASSESSMENT:    1. Coronary artery disease involving native coronary artery of native heart without angina pectoris   2. Primary hypertension   3. Pure hypercholesterolemia     PLAN:    In order of problems listed above:  Coronary artery disease involving native coronary artery of native heart without angina pectoris Overall doing very well without any cardiac anginal symptoms.  He did go to the emergency department in med [facility], likely musculoskeletal discomfort.  Troponins were normal.  EKG unremarkable personally reviewed and interpreted.  No ischemic changes.  Normal ejection fraction.  Cardiac  catheterization reviewed as well.  Medical management.  Continue with aspirin 81 mg carvedilol 12.5 mg twice a day, atorvastatin 80 mg, Vascepa 2 g twice a day, amlodipine 10 mg.  No changes in prescription drug management.  Excellent.  HTN (hypertension) Well-controlled on medication prescription management as above.  No changes made after review.  HLD (hyperlipidemia) Excellent LDL of 63 less than 70 goal.  Continue with current regimen which includes atorvastatin 80 mg as well as Vascepa.  No myalgias.  ALT 36.   Follow-up:  1 year   Medication Adjustments/Labs and Tests Ordered: Current medicines are reviewed at length with the patient today.  Concerns regarding medicines are outlined above.   No orders of the defined types were placed in this encounter.  No orders of the defined types were placed in this encounter.  Patient Instructions  Medication Instructions:  The current medical regimen is effective;  continue present plan and medications.  *If you need a refill on your cardiac medications before your next appointment, please call your pharmacy*  Follow-Up: At [facility], you and your health needs are our priority.  As part of our continuing mission to provide you with exceptional heart care, we have created designated Provider Care Teams.  These Care Teams include your primary Cardiologist (physician) and Advanced Practice Providers (APPs -  Physician Assistants and Nurse Practitioners) who all work together to provide you with the care you need, when you need it.  We recommend signing up for the patient portal called "MyChart".  Sign up information is provided on this After Visit Summary.  MyChart is used to connect with patients for Virtual Visits (Telemedicine).  Patients are able to view lab/test results, encounter notes, upcoming appointments, etc.  Non-urgent messages can be sent to your provider as well.   To learn more about what you can do with MyChart, go to  [email or web address].    Your next appointment:   1 year(s)  The format for your next appointment:   In Person  Provider:   Candee Furbish, MD {  Important Information About Sugar         I,Mathew Stumpf,acting as a scribe for Candee Furbish, MD.,have documented all relevant documentation on the behalf of Candee Furbish, MD,as directed by  Candee Furbish, MD while in the presence of Candee Furbish, MD.  I, Candee Furbish, MD, have reviewed all documentation for this visit. The documentation on 05/16/22 for the exam, diagnosis, procedures, and orders are all accurate and complete.   Signed, Candee Furbish, MD  05/16/2022 1:54 PM    [place]

## 2022-05-16 NOTE — Assessment & Plan Note (Signed)
Overall doing very well without any cardiac anginal symptoms.  He did go to the emergency department in med [facility], likely musculoskeletal discomfort.  Troponins were normal.  EKG unremarkable personally reviewed and interpreted.  No ischemic changes.  Normal ejection fraction.  Cardiac catheterization reviewed as well.  Medical management.  Continue with aspirin 81 mg carvedilol 12.5 mg twice a day, atorvastatin 80 mg, Vascepa 2 g twice a day, amlodipine 10 mg.  No changes in prescription drug management.  Excellent.

## 2022-05-16 NOTE — Assessment & Plan Note (Signed)
Well-controlled on medication prescription management as above.  No changes made after review.

## 2022-05-21 ENCOUNTER — Telehealth: Payer: No Typology Code available for payment source | Admitting: Neurology

## 2022-05-21 ENCOUNTER — Encounter: Payer: Self-pay | Admitting: Neurology

## 2022-05-21 DIAGNOSIS — G43709 Chronic migraine without aura, not intractable, without status migrainosus: Secondary | ICD-10-CM | POA: Diagnosis not present

## 2022-05-21 DIAGNOSIS — G43009 Migraine without aura, not intractable, without status migrainosus: Secondary | ICD-10-CM | POA: Diagnosis not present

## 2022-05-21 DIAGNOSIS — G43711 Chronic migraine without aura, intractable, with status migrainosus: Secondary | ICD-10-CM

## 2022-05-21 MED ORDER — UBRELVY 50 MG PO TABS: ORAL_TABLET | ORAL | 11 refills | Status: AC

## 2022-05-21 MED ORDER — EMGALITY 120 MG/ML ~~LOC~~ SOAJ: 120.0000 mg | SUBCUTANEOUS | 11 refills | Status: AC

## 2022-05-21 NOTE — Progress Notes (Signed)
[facility]    Provider:  Dr Jacob Gordon Requesting Provider: Floreen Gordon, Jacob Gordon, * Primary Care Provider:  Tresa Gordon, Jacob V, MD  CC:  chronic migraines  Virtual Visit via Video Note  I connected with Jacob Gordon on 05/21/22 at 11:30 AM EDT by a video enabled telemedicine application and verified that I am speaking with the correct person using two identifiers.  Location: Patient: home Provider: office   I discussed the limitations of evaluation and management by telemedicine and the availability of in person appointments. The patient expressed understanding and agreed to proceed.   Follow Up Instructions:    I discussed the assessment and treatment plan with the patient. The patient was provided an opportunity to ask questions and all were answered. The patient agreed with the plan and demonstrated an understanding of the instructions.   The patient was advised to call back or seek an in-person evaluation if the symptoms worsen or if the condition fails to improve as anticipated.  I provided 30 minutes of non-face-to-face time during this encounter.   Jacob Gordon, Jacob Kamen B, MD  Interval history May 21, 2022: Patient is doing great on Emgality, his baseline was daily headaches and migraines, on the Emgality it has significantly improved greater than 90%.  He will have may be 4 migraine days a month now, and Bernita RaisinUbrelvy helps with that immediately, he is extremely thrilled, he does not need a CPAP, not having any morning headaches, he wants to keep things exactly the way that they are.  Patient complains of symptoms per HPI as well as the following symptoms: migraine . Pertinent negatives and positives per HPI. All others negative   Patient complains of symptoms per HPI as well as the following symptoms: migraine . Pertinent negatives and positives per HPI. All others negative    Interval 01/30/2022: he did GREAT on the emgality. He is in the donut hole.  He could not afford it. I will leave him a year of samples and readdress next year.  [facility] may also cover it, we will look into it. He never reached out to them. We will see if we can get Emgality or Ajovy from the [facility] but in the meantime will leave samples of the front. The Bernita RaisinUbrelvy works but we can try to get that from the [facility] as well. Can also look into Emgality or Ajovy programs to get the medications for free. Daily headaches to less than 8 headache days a month, the [place] works if he takes it right away. So infrequent he doesn't even chart it anymore.   Patient complains of symptoms per HPI as well as the following symptoms: migraine . Pertinent negatives and positives per HPI. All others negative   Interval history 07/31/2021: Doing exceptionally well.  Has gone from daily headaches to <8 headaches days a month and many being mild and easily treatable with Bernita RaisinUbrelvy which helps. He has not had a single severe headache since starting the Emgality. When he takes the [place], it takes a few hours and the headache is completely gone, advised to take it right away and take it early. We will continue Emgality.   HPI:  Jacob Gordon is a 66 y.o. male here as requested by Jacob Gordon, Jacob Gordon, * for daily headaches.  Past medical history chronic headaches, coronary artery disease, degenerative disc disease, diabetes, history of kidney stones, hyperlipidemia, hypertension, osteoarthritis, sleep apnea, thrombocytopenia.  I reviewed Jacob. Lucky Rathkeosen's notes: Patient was seen for headaches, he has a long life  history of daily sinus headaches that affected mainly at night, sometimes awaken him up at night, he does suffer with chronic nasal congestion and has a lot of sneezing in the morning, he does not have much anterior nasal discharge but has chronic postnasal drip, he also has a chronic sensation of a glob of something in the back of his throat, he does suffer with heartburn, he is on Nexium but he takes it at  nighttime, his only dietary risk factor for reflux is chocolate consumption.  CT was negative for chronic sinus disease, sent for evaluation of possible migraines.   He has had headaches as far back as he can remember as a teenager. No idenfiable triggers or inciting events, he has been seeing ENTs and allergists to see if that is the problem. He has pressure behind the eyes, both eyes, he finds himself holding his nose and pressure points to make it better, more pressure in the temples, massage doesn't help, they are pulsating or pounding or throbbing more pressure, he has light sensitivity with the headaches, no smell or sound sensitivity, + nausea, it can be severe at times usually mild to moderate or severe and daily all day for years. Turning off the lights and not moving helps, movement makes it worse, no FHx of migraines or headaches. He has the headaches daily. It will frequently wake him up in the middle of the night to take excedrin. No other lacrimation or rhinorrhea or cluster-type headaches. He is not a good sleeper. Taking daily excedrin. More frequent over the years. He remembers the headaches being daily even since HS. No aura. No other focal neurologic deficits, associated symptoms, inciting events or modifiable factors.   Reviewed notes, labs and imaging from outside physicians, which showed:  I reviewed CT paranasal sinuses without contrast report February 05, 2021 impression: No significant paranasal sinus inflammatory changes.  Patent drainage pathways.  CT head 12/15/2020: IMPRESSION: Personally reviewed images and agree. 1. No evidence of acute intracranial abnormality. 2. Mild chronic microvascular ischemic disease.  Medications tried: carvedilol, meloxicam, gabapentin, amlodipine, cymbalta, metoprolol, rabaxin, nortriptyline, sumatriptan, rizatriptan, zolmitriptan  Review of Systems: Patient complains of symptoms per HPI as well as the following symptoms headache. Pertinent  negatives and positives per HPI. All others negative.   Social History   Socioeconomic History   Marital status: Married    Spouse name: Not on file   Number of children: 1   Years of education: Not on file   Highest education level: Not on file  Occupational History   Occupation: Administrative   Tobacco Use   Smoking status: Never   Smokeless tobacco: Never  Vaping Use   Vaping Use: Never used  Substance and Sexual Activity   Alcohol use: No   Drug use: No   Sexual activity: Yes  Other Topics Concern   Not on file  Social History Narrative   Caffeine daily    Social Determinants of Health   Financial Resource Strain: Not on file  Food Insecurity: Not on file  Transportation Needs: Not on file  Physical Activity: Not on file  Stress: Not on file  Social Connections: Not on file  Intimate Partner Violence: Not on file    Family History  Problem Relation Age of Onset   Diabetes Sister    Colon cancer Neg Hx     Past Medical History:  Diagnosis Date   ALLERGIC RHINITIS    Chronic headaches    migraines   Complication of  anesthesia    violent vomiting    Coronary artery disease    non obstructive   DEGENERATIVE DISC DISEASE    DIABETES MELLITUS, TYPE II    type 2   GERD    History of kidney stones    HLD (hyperlipidemia)    HYPERTENSION    Internal hemorrhoids    OSTEOARTHRITIS    PONV (postoperative nausea and vomiting)    Sleep apnea    no cpap mild case   THROMBOCYTOPENIA     Patient Active Problem List   Diagnosis Date Noted   Migraine without aura and without status migrainosus, not intractable 05/21/2022   Calculus of ureter 01/17/2022   Chronic post-traumatic stress disorder 01/17/2022   Lumbago with sciatica, right side 01/17/2022   Melanocytic nevi of trunk 01/17/2022   Muscle weakness (generalized) 01/17/2022   Kidney stone 01/17/2022   Nocturia 01/17/2022   Osteoarthrosis, localized, primary, involving shoulder region 01/17/2022    Skin tag 01/17/2022   Right-sided chest pain 01/17/2022   Left upper quadrant abdominal pain 12/10/2021   Renal colic on left side 12/04/2021   Chronic migraine without aura, with intractable migraine, so stated, with status migrainosus 07/31/2021   Chronic migraine without aura without status migrainosus, not intractable 04/26/2021   Chronic tension-type headache, not intractable 04/26/2021   Analgesic rebound headache 04/26/2021   Daily headache 02/05/2021   Laryngopharyngeal reflux (LPR) 02/05/2021   OA (osteoarthritis) of hip 07/28/2019   Preop exam for internal medicine 06/10/2019   Piriformis syndrome, right 03/11/2019   Gluteal tendinitis of right buttock 02/04/2019   Arthritis of right hip 02/04/2019   Hemorrhoids 01/15/2019   Discoloration of skin of toe 08/13/2018   Left foot pain 08/13/2018   Right hip pain 08/13/2018   Abnormal cardiac CT angiography    Coronary artery disease involving native coronary artery of native heart without angina pectoris    Angina pectoris (HCC) 01/02/2018   DOE (dyspnea on exertion) 11/26/2017   Chest pressure 11/26/2017   Ingrowing hair 03/17/2017   Abdominal tenderness of left lower quadrant 06/08/2015   Bladder neck obstruction 12/01/2012   Night sweats 09/30/2012   Headache 07/29/2012   Insomnia 07/29/2012   Loose stools 12/13/2011   Dyspepsia 12/13/2011   Dysphagia 11/13/2011   Nausea 11/13/2011   Well adult exam 07/10/2011   BPH (benign prostatic hyperplasia) 07/10/2011   THROMBOCYTOPENIA 02/06/2010   Myalgia 01/31/2010   Pain in joint 11/29/2008   Diabetes mellitus type 2, controlled (HCC) 05/31/2008   GERD (gastroesophageal reflux disease) 05/31/2008   Arthralgia 05/31/2008   HLD (hyperlipidemia) 03/02/2008   HTN (hypertension) 03/02/2008   Allergic rhinitis 03/02/2008   ARTHRITIS, SHOULDER 03/02/2008   DEGENERATIVE DISC DISEASE 03/02/2008    Past Surgical History:  Procedure Laterality Date   COLONOSCOPY  10/01/11    internal hemorrhoids   LEFT HEART CATH AND CORONARY ANGIOGRAPHY N/A 01/07/2018   Procedure: LEFT HEART CATH AND CORONARY ANGIOGRAPHY;  Surgeon: Kathleene Hazel, MD;  Location: [facility];  Service: Cardiovascular;  Laterality: N/A;   SHOULDER SURGERY     right rotator cuff surg   TONSILLECTOMY AND ADENOIDECTOMY     TOTAL HIP ARTHROPLASTY Right 07/28/2019   Procedure: TOTAL HIP ARTHROPLASTY ANTERIOR APPROACH;  Surgeon: Ollen Gross, MD;  Location: [facility];  Service: Orthopedics;  Laterality: Right;    VASECTOMY      Current Outpatient Medications  Medication Sig Dispense Refill   amLODipine (NORVASC) 10 MG tablet TAKE 1 TABLET  BY MOUTH EVERY DAY. 90 tablet 3   aspirin EC 81 MG tablet Take 81 mg by mouth daily.     atorvastatin (LIPITOR) 80 MG tablet TAKE 1 TABLET(80 MG) BY MOUTH DAILY 90 tablet 3   carvedilol (COREG) 12.5 MG tablet Take 1 tablet (12.5 mg total) by mouth 2 (two) times daily with a meal. 180 tablet 3   Cholecalciferol (VITAMIN D-3) 125 MCG (5000 UT) TABS Take 5,000 Units by mouth daily.     fluticasone (FLONASE) 50 MCG/ACT nasal spray Place 2 sprays into both nostrils daily. 16 g 1   Galcanezumab-gnlm (EMGALITY) 120 MG/ML SOAJ Inject 120 mg into the skin every 30 (thirty) days. 1.12 mL 11   HYDROcodone-acetaminophen (NORCO) 5-325 MG tablet Take 1 tablet by mouth every 4 (four) hours as needed for severe pain (Renal colic). 20 tablet 0   icosapent Ethyl (VASCEPA) 1 g capsule Take 2 capsules (2 g total) by mouth 2 (two) times daily. 360 capsule 1   meloxicam (MOBIC) 15 MG tablet Take 1 tablet (15 mg total) by mouth daily as needed for pain. 90 tablet 1   methocarbamol (ROBAXIN) 500 MG tablet Take 1 tablet (500 mg total) by mouth every 6 (six) hours as needed for muscle spasms. 40 tablet 0   OVER THE COUNTER MEDICATION Take 1 capsule by mouth daily. Suprema Dophilus Supplement - probiotic     oxyCODONE (ROXICODONE) 5 MG immediate release tablet Take 1 tablet  (5 mg total) by mouth every 4 (four) hours as needed for severe pain. 5 tablet 0   pantoprazole (PROTONIX) 40 MG tablet TAKE 1 TABLET BY MOUTH DAILY 30 tablet 11   tadalafil (CIALIS) 5 MG tablet TAKE ONE TABLET BY MOUTH DAILY 90 tablet 1   tamsulosin (FLOMAX) 0.4 MG CAPS capsule Take 1 capsule (0.4 mg total) by mouth daily as needed. Renal colic 90 capsule 1   Ubrogepant (UBRELVY) 50 MG TABS Take 50mg  by mouth every 2 hours as needed for migraines   Maximum 100 mg daily 16 tablet 11   No current facility-administered medications for this visit.    Allergies as of 05/21/2022 - Review Complete 05/16/2022  Allergen Reaction Noted   Tape Other (See Comments) 12/29/2012   Sulfa antibiotics Itching 12/29/2012   Valsartan Other (See Comments)    Pravastatin sodium Nausea Only     Vitals: There were no vitals taken for this visit. Last Weight:  Wt Readings from Last 1 Encounters:  05/16/22 211 lb (95.7 kg)   Last Height:   Ht Readings from Last 1 Encounters:  05/16/22 5\' 9"  (1.753 m)     Physical exam: Exam: Gen: NAD, conversant      CV: Could not perform over Web Video. Denies palpitations or chest pain or SOB. VS: Breathing at a normal rate. Weight appears within normal limits. Not febrile. Eyes: Conjunctivae clear without exudates or hemorrhage  Neuro: Detailed Neurologic Exam  Speech:    Speech is normal; fluent and spontaneous with normal comprehension.  Cognition:    The patient is oriented to person, place, and time;     recent and remote memory intact;     language fluent;     normal attention, concentration,     fund of knowledge Cranial Nerves:    The pupils are equal, round, and reactive to light. Attempted, Cannot perform fundoscopic exam. Visual fields are full to finger confrontation. Extraocular movements are intact.  The face is symmetric with normal sensation. The palate elevates in  the midline. Hearing intact. Voice is normal. Shoulder shrug is normal. The  tongue has normal motion without fasciculations.   Coordination:    Normal finger to nose  Gait:    Normal native gait  Motor Observation:   no involuntary movements noted. Tone:    Appears normal  Posture:    Posture is normal. normal erect    Strength:    Strength is anti-gravity and symmetric in the upper and lower limbs.      Sensation: intact to LT     Assessment/Plan: This is a patient with Chronic migraine, no more medication overuse, no more excedrin, doing exceptional, was having daily chronic headaches and migraines and now he is extremely happy Emgality has been fantastic!  Interval history May 21, 2022: Patient is doing great on Emgality, his baseline was daily headaches and migraines, on the Emgality it has significantly improved greater than 90%.  He will have may be 4 migraine days a month now, and Bernita Raisin helps with that immediately, he is extremely thrilled, he does not need a CPAP, not having any morning headaches, he wants to keep things exactly the way that they are.  At last appointment March 2023 was doing great on Emgality but could not afford it in the donut hole, I left him leave him a year of samples and readdress next year.  [facility] [place] may also cover it, we will look into it. He never reached out to them. We will see if we can get Emgality or Ajovy from the [place] but in the meantime will leave samples of the front.   Bernita Raisin approved  - has had sleep studies in the past, doing exceptional, no more significant morning headaches.   - f/u 6 months but if doing well can push to a year  Follow up 6 months Refill emgality - [facility] [place] [facility]  No orders of the defined types were placed in this encounter.   Meds ordered this encounter  Medications   Galcanezumab-gnlm (EMGALITY) 120 MG/ML SOAJ    Sig: Inject 120 mg into the skin every 30 (thirty) days.    Dispense:  1.12 mL    Refill:  11    Patient has copay card; he can have medication  regardless of insurance approval or copay amount.   Ubrogepant (UBRELVY) 50 MG TABS    Sig: Take 50mg  by mouth every 2 hours as needed for migraines   Maximum 100 mg daily    Dispense:  16 tablet    Refill:  11      Cc: , *,  Plotnikov, Jacob Comber, MD  Georgina Quint, MD  [facility] [address] [place], [place]  Phone [phone] Fax [phone]

## 2022-05-21 NOTE — Patient Instructions (Signed)
Follow up 6 months Refill emgality - [facility] Refill [place] - [place]

## 2022-05-22 ENCOUNTER — Encounter: Payer: Self-pay | Admitting: Internal Medicine

## 2022-05-22 ENCOUNTER — Ambulatory Visit (INDEPENDENT_AMBULATORY_CARE_PROVIDER_SITE_OTHER): Payer: Medicare Other | Admitting: Internal Medicine

## 2022-05-22 DIAGNOSIS — M2550 Pain in unspecified joint: Secondary | ICD-10-CM

## 2022-05-22 DIAGNOSIS — M25561 Pain in right knee: Secondary | ICD-10-CM

## 2022-05-22 DIAGNOSIS — I10 Essential (primary) hypertension: Secondary | ICD-10-CM

## 2022-05-22 DIAGNOSIS — E1121 Type 2 diabetes mellitus with diabetic nephropathy: Secondary | ICD-10-CM

## 2022-05-22 MED ORDER — REPAGLINIDE 1 MG PO TABS: 1.0000 mg | ORAL_TABLET | Freq: Three times a day (TID) | ORAL | 11 refills | Status: DC

## 2022-05-22 MED ORDER — OZEMPIC (0.25 OR 0.5 MG/DOSE) 2 MG/1.5ML ~~LOC~~ SOPN: 0.5000 mg | PEN_INJECTOR | SUBCUTANEOUS | 2 refills | Status: DC

## 2022-05-22 NOTE — Patient Instructions (Signed)
Sleeve brace, ice Blue-Emu cream --use 2-3 times a day

## 2022-05-22 NOTE — Assessment & Plan Note (Signed)
Start Ozempic if covered Dexcom 7 sample  Start Prandin 1 tid ac

## 2022-05-22 NOTE — Assessment & Plan Note (Addendum)
Worse Sleeve brace, ice Blue-Emu cream was recommended to use 2-3 times a day

## 2022-05-22 NOTE — Progress Notes (Signed)
Subjective:  Patient ID: Jacob Gordon, male    DOB: May 17, 1956  Age: 66 y.o. MRN: [id number]  CC: Follow-up (No concerns just medication look over )   HPI Demarkis Kai Calico presents for DM, HTN, OA  Outpatient Medications Prior to Visit  Medication Sig Dispense Refill   amLODipine (NORVASC) 10 MG tablet TAKE 1 TABLET BY MOUTH EVERY DAY. 90 tablet 3   aspirin EC 81 MG tablet Take 81 mg by mouth daily.     atorvastatin (LIPITOR) 80 MG tablet TAKE 1 TABLET(80 MG) BY MOUTH DAILY 90 tablet 3   carvedilol (COREG) 12.5 MG tablet Take 1 tablet (12.5 mg total) by mouth 2 (two) times daily with a meal. 180 tablet 3   Cholecalciferol (VITAMIN D-3) 125 MCG (5000 UT) TABS Take 5,000 Units by mouth daily.     fluticasone (FLONASE) 50 MCG/ACT nasal spray Place 2 sprays into both nostrils daily. 16 g 1   Galcanezumab-gnlm (EMGALITY) 120 MG/ML SOAJ Inject 120 mg into the skin every 30 (thirty) days. 1.12 mL 11   HYDROcodone-acetaminophen (NORCO) 5-325 MG tablet Take 1 tablet by mouth every 4 (four) hours as needed for severe pain (Renal colic). 20 tablet 0   icosapent Ethyl (VASCEPA) 1 g capsule Take 2 capsules (2 g total) by mouth 2 (two) times daily. 360 capsule 1   meloxicam (MOBIC) 15 MG tablet Take 1 tablet (15 mg total) by mouth daily as needed for pain. 90 tablet 1   methocarbamol (ROBAXIN) 500 MG tablet Take 1 tablet (500 mg total) by mouth every 6 (six) hours as needed for muscle spasms. 40 tablet 0   OVER THE COUNTER MEDICATION Take 1 capsule by mouth daily. Suprema Dophilus Supplement - probiotic     oxyCODONE (ROXICODONE) 5 MG immediate release tablet Take 1 tablet (5 mg total) by mouth every 4 (four) hours as needed for severe pain. 5 tablet 0   pantoprazole (PROTONIX) 40 MG tablet TAKE 1 TABLET BY MOUTH DAILY 30 tablet 11   tadalafil (CIALIS) 5 MG tablet TAKE ONE TABLET BY MOUTH DAILY 90 tablet 1   tamsulosin (FLOMAX) 0.4 MG CAPS capsule Take 1 capsule (0.4 mg total) by mouth daily  as needed. Renal colic 90 capsule 1   Ubrogepant (UBRELVY) 50 MG TABS Take 50mg  by mouth every 2 hours as needed for migraines   Maximum 100 mg daily 16 tablet 11   No facility-administered medications prior to visit.    ROS: Review of Systems  Constitutional:  Negative for appetite change, fatigue and unexpected weight change.  HENT:  Negative for congestion, nosebleeds, sneezing, sore throat and trouble swallowing.   Eyes:  Negative for itching and visual disturbance.  Respiratory:  Negative for cough.   Cardiovascular:  Negative for chest pain, palpitations and leg swelling.  Gastrointestinal:  Negative for abdominal distention, blood in stool, diarrhea and nausea.  Genitourinary:  Negative for frequency and hematuria.  Musculoskeletal:  Positive for arthralgias and gait problem. Negative for back pain, joint swelling and neck pain.  Skin:  Negative for rash.  Neurological:  Negative for dizziness, tremors, speech difficulty and weakness.  Psychiatric/Behavioral:  Negative for agitation, dysphoric mood and sleep disturbance. The patient is not nervous/anxious.     Objective:  BP 110/84 (BP Location: Left Arm, Patient Position: Sitting, Cuff Size: Large)   Pulse 72   Temp 98.3 F (36.8 C) (Oral)   Ht 5\' 9"  (1.753 m)   Wt 213 lb (96.6 kg)   SpO2  97%   BMI 31.45 kg/m   BP Readings from Last 3 Encounters:  05/22/22 110/84  05/16/22 110/70  04/03/22 (!) 148/96    Wt Readings from Last 3 Encounters:  05/22/22 213 lb (96.6 kg)  05/16/22 211 lb (95.7 kg)  04/03/22 209 lb (94.8 kg)    Physical Exam Constitutional:      General: He is not in acute distress.    Appearance: Normal appearance. He is well-developed.     Comments: NAD  Eyes:     Conjunctiva/sclera: Conjunctivae normal.     Pupils: Pupils are equal, round, and reactive to light.  Neck:     Thyroid: No thyromegaly.     Vascular: No JVD.  Cardiovascular:     Rate and Rhythm: Normal rate and regular rhythm.      Heart sounds: Normal heart sounds. No murmur heard.    No friction rub. No gallop.  Pulmonary:     Effort: Pulmonary effort is normal. No respiratory distress.     Breath sounds: Normal breath sounds. No wheezing or rales.  Chest:     Chest wall: No tenderness.  Abdominal:     General: Bowel sounds are normal. There is no distension.     Palpations: Abdomen is soft. There is no mass.     Tenderness: There is no abdominal tenderness. There is no guarding or rebound.  Musculoskeletal:        General: Tenderness present. Normal range of motion.     Cervical back: Normal range of motion.  Lymphadenopathy:     Cervical: No cervical adenopathy.  Skin:    General: Skin is warm and dry.     Findings: No rash.  Neurological:     Mental Status: He is alert and oriented to person, place, and time.     Cranial Nerves: No cranial nerve deficit.     Motor: No abnormal muscle tone.     Coordination: Coordination normal.     Gait: Gait normal.     Deep Tendon Reflexes: Reflexes are normal and symmetric.  Psychiatric:        Behavior: Behavior normal.        Thought Content: Thought content normal.        Judgment: Judgment normal.     Lab Results  Component Value Date   WBC 6.6 12/24/2021   HGB 14.6 12/24/2021   HCT 44.6 12/24/2021   PLT 154 12/24/2021   GLUCOSE 149 (H) 04/23/2022   CHOL 116 04/23/2022   TRIG 63.0 04/23/2022   HDL 40.60 04/23/2022   LDLDIRECT 184.1 10/20/2012   LDLCALC 63 04/23/2022   ALT 36 04/23/2022   AST 24 04/23/2022   NA 139 04/23/2022   K 3.6 04/23/2022   CL 104 04/23/2022   CREATININE 1.17 04/23/2022   BUN 11 04/23/2022   CO2 30 04/23/2022   TSH 1.50 12/13/2020   PSA 1.13 12/13/2020   INR 1.2 07/22/2019   HGBA1C 8.0 (H) 04/23/2022   MICROALBUR 0.9 12/13/2020    CT RENAL STONE STUDY  Result Date: 12/27/2021 CLINICAL DATA:  Left renal colic. EXAM: CT ABDOMEN AND PELVIS WITHOUT CONTRAST TECHNIQUE: Multidetector CT imaging of the abdomen and pelvis  was performed following the standard protocol without IV contrast. RADIATION DOSE REDUCTION: This exam was performed according to the departmental dose-optimization program which includes automated exposure control, adjustment of the mA and/or kV according to patient size and/or use of iterative reconstruction technique. COMPARISON:  CT with IV contrast 06/12/2015 FINDINGS:  Lower chest: There is left lower lobe linear scarring or atelectasis. There are no lung base infiltrates. The cardiac size is normal. Small chronic pericardial effusion is noted anteriorly and calcification in the distal LAD and right coronary arteries. Hepatobiliary: Several scattered hepatic cysts. Largest in the right lobe is 2.7 cm and 7.4 Hounsfield units. Largest in the left lobe is 2.3 cm in 7.3 Hounsfield units. Some were present previously and larger than previously and some are new. There are additional too small to characterize scattered hypodensities. Gallbladder and bile ducts are unremarkable. Pancreas: Unremarkable without contrast. Spleen: Unremarkable without contrast. Adrenals/Urinary Tract: No adrenal mass is seen. Small cyst again noted posteriorly in the upper pole left kidney. The renal cortex otherwise unremarkable without contrast. There are several scattered punctate nonobstructive caliceal stones in the upper to midpole right kidney, and scattered punctate up to 1-2 mm stones in the left renal collecting system. No hydronephrosis or ureteral stones are seen although please note the distal right ureter below the level of the acetabulum is obscured by metal artifact from a right hip arthroplasty. Bladder wall is thickened which was seen previously, but is not fully distended. Stomach/Bowel: No dilatation or wall thickening including the appendix. Moderate fecal stasis. There are colonic diverticula without evidence of colitis or diverticulitis. Vascular/Lymphatic: There is moderate aortoiliac calcific plaque. There is no  AAA but there is ectasia in the common iliac arteries, which measure 1.7 cm on the right and 1.4 cm on the left. Scattered calcific plaque in the branch arteries. Reproductive: Enlarged prostate, measuring 4.5 cm with bladder impression. Other: Small umbilical fat hernia. No incarcerated hernia. No free air, hemorrhage or fluid. Musculoskeletal: There are chronic L5 pars defects without L5-S1 spondylolisthesis. No acute or significant osseous findings. Right hip arthroplasty. Left hip DJD. IMPRESSION: 1. There is nonobstructive micronephrolithiasis. No hydronephrosis or ureteral stone is seen, but with the most distal right ureter obscured by his right hip arthroplasty. 2. Thickened bladder which could be due to nondistention, hypertrophy or cystitis. 3. Prostatomegaly. 4. Aortic and coronary artery atherosclerosis. 5. Hepatic cysts and additional too small to characterize hypodensities. 6. Constipation with diverticulosis. 7. Umbilical fat hernia. Electronically Signed   By: Almira Bar M.D.   On: 12/27/2021 03:20    Assessment & Plan:   Problem List Items Addressed This Visit     Diabetes mellitus type 2, controlled (HCC)    Start Ozempic if covered Dexcom 7 sample  Start Prandin 1 tid ac       Relevant Medications   repaglinide (PRANDIN) 1 MG tablet   Semaglutide,0.25 or 0.5MG /DOS, (OZEMPIC, 0.25 OR 0.5 MG/DOSE,) 2 MG/1.5ML SOPN   Other Relevant Orders   Comprehensive metabolic panel   Hemoglobin A1c   HTN (hypertension)    Chronic -doing well Continue on amlodipine, Coreg      Knee pain, right    Worse Sleeve brace, ice Blue-Emu cream was recommended to use 2-3 times a day       Pain in joint    Blue-Emu cream was recommended to use 2-3 times a day       Relevant Orders   Comprehensive metabolic panel      Meds ordered this encounter  Medications   repaglinide (PRANDIN) 1 MG tablet    Sig: Take 1 tablet (1 mg total) by mouth 3 (three) times daily before meals.     Dispense:  90 tablet    Refill:  11   DISCONTD: Semaglutide,0.25 or 0.5MG /DOS, (OZEMPIC, 0.25  OR 0.5 MG/DOSE,) 2 MG/1.5ML SOPN    Sig: Inject 0.5 mg into the skin once a week.    Dispense:  3 mL    Refill:  2   Semaglutide,0.25 or 0.5MG /DOS, (OZEMPIC, 0.25 OR 0.5 MG/DOSE,) 2 MG/1.5ML SOPN    Sig: Inject 0.5 mg into the skin once a week.    Dispense:  3 mL    Refill:  2      Follow-up: Return in about 2 months (around 07/23/2022) for a follow-up visit.  Sonda Primes, MD

## 2022-05-22 NOTE — Assessment & Plan Note (Signed)
Blue-Emu cream was recommended to use 2-3 times a day ? ?

## 2022-05-23 ENCOUNTER — Telehealth: Payer: Self-pay | Admitting: *Deleted

## 2022-05-23 NOTE — Telephone Encounter (Signed)
Rec'd determination med was APPROVED. EFFECTIVE from 05/23/2022 through 05/24/2023. Will faxed approval to pof.Marland KitchenRaechel Chute

## 2022-05-23 NOTE — Telephone Encounter (Signed)
Pt need cover-my-meds need PA on Ozempic. Submitted PA w/ Key: [id number]. PA sent to plan.Marland KitchenRaechel Chute

## 2022-05-27 NOTE — Telephone Encounter (Signed)
Noted../l,mb 

## 2022-06-03 NOTE — Assessment & Plan Note (Addendum)
Chronic -doing well Continue on amlodipine, Coreg

## 2022-06-12 ENCOUNTER — Encounter: Payer: Self-pay | Admitting: Neurology

## 2022-08-01 ENCOUNTER — Telehealth: Payer: Self-pay

## 2022-08-01 ENCOUNTER — Telehealth (INDEPENDENT_AMBULATORY_CARE_PROVIDER_SITE_OTHER): Payer: Medicare Other | Admitting: Nurse Practitioner

## 2022-08-01 DIAGNOSIS — U071 COVID-19: Secondary | ICD-10-CM

## 2022-08-01 MED ORDER — BENZONATATE 100 MG PO CAPS: 100.0000 mg | ORAL_CAPSULE | Freq: Two times a day (BID) | ORAL | 0 refills | Status: DC | PRN

## 2022-08-01 MED ORDER — MOLNUPIRAVIR EUA 200MG CAPSULE: 4.0000 | ORAL_CAPSULE | Freq: Two times a day (BID) | ORAL | 0 refills | Status: AC

## 2022-08-01 NOTE — Assessment & Plan Note (Signed)
Acute, symptoms remain mild.  Patient would be a good candidate for antiviral medication based on his age and chronic health conditions such as type 2 diabetes and hypertension.  We will opt to prescribe molnupiravir due to patient's multiple medications as well as borderline kidney function.  Patient also prescribed Tessalon Perles that he can take as needed for cough suppression twice a day, he was encouraged to use either the Tessalon Perles or Mucinex DM or DayQuil.  He reports understanding.  Patient educated on current isolation guidelines and to proceed to the emergency department if symptoms worsen, he reports understanding.

## 2022-08-01 NOTE — Progress Notes (Signed)
   Established Patient Office Visit  An audio-only tele-health visit was completed today for this patient. I connected with  Jacob Gordon on 08/01/22 utilizing audio-only technology and verified that I am speaking with the correct person using two identifiers. The patient was located at their home, and I was located at the office of [place] [facility] during the encounter. I discussed the limitations of evaluation and management by telemedicine. The patient expressed understanding and agreed to proceed.     Subjective   Patient ID: Jacob Gordon, male    DOB: Feb 03, 1956  Age: 66 y.o. MRN: [id number]  Chief Complaint  Patient presents with   Covid Positive    Patient arrives for virtual visit today for the above.  He reports coming back from international trip 5 days ago.  Approximately 3 days ago he started experiencing flulike symptoms.  He took a COVID-19 test which was positive.  He has been vaccinated for COVID-19 x5, has had flu shot this year.  Has been taking DayQuil and using Ricola cough drops.    Review of Systems  Constitutional:  Negative for chills, fever and malaise/fatigue.  HENT:  Positive for congestion.   Respiratory:  Positive for cough. Negative for shortness of breath.   Cardiovascular:  Negative for chest pain.  Gastrointestinal:  Positive for nausea. Negative for diarrhea and vomiting.  Neurological:  Positive for headaches.  Psychiatric/Behavioral:  The patient has insomnia.       Objective:     There were no vitals taken for this visit.   Physical Exam Comprehensive physical exam not completed today as office visit was conducted remotely.  Patient sounded well over the phone, he was able to speak in complete sentences but having to stop to breathe, he did cough a couple of times.  Patient was alert and oriented, and appeared to have appropriate judgment.   No results found for any visits on 08/01/22.    The ASCVD Risk  score (Arnett DK, et al., 2019) failed to calculate for the following reasons:   The systolic blood pressure is missing   The valid total cholesterol range is 130 to 320 mg/dL    Assessment & Plan:   Problem List Items Addressed This Visit       Other   COVID-19 - Primary    Acute, symptoms remain mild.  Patient would be a good candidate for antiviral medication based on his age and chronic health conditions such as type 2 diabetes and hypertension.  We will opt to prescribe molnupiravir due to patient's multiple medications as well as borderline kidney function.  Patient also prescribed Tessalon Perles that he can take as needed for cough suppression twice a day, he was encouraged to use either the Tessalon Perles or Mucinex DM or DayQuil.  He reports understanding.  Patient educated on current isolation guidelines and to proceed to the emergency department if symptoms worsen, he reports understanding.      Relevant Medications   molnupiravir EUA (LAGEVRIO) 200 mg CAPS capsule   benzonatate (TESSALON) 100 MG capsule    Return if symptoms worsen or fail to improve.  Total time spent the phone today was 12 minutes and 24 seconds.   Ailene Ards, NP

## 2022-08-01 NOTE — Telephone Encounter (Signed)
Patient had a visit with Judson Roch, medications she prescribed are not at the pharmacy. Asking if we can re-send this.

## 2022-08-06 ENCOUNTER — Ambulatory Visit: Payer: Medicare Other | Admitting: Internal Medicine

## 2022-08-20 ENCOUNTER — Ambulatory Visit (INDEPENDENT_AMBULATORY_CARE_PROVIDER_SITE_OTHER): Payer: Medicare Other | Admitting: Internal Medicine

## 2022-08-20 ENCOUNTER — Encounter: Payer: Self-pay | Admitting: Internal Medicine

## 2022-08-20 ENCOUNTER — Ambulatory Visit (INDEPENDENT_AMBULATORY_CARE_PROVIDER_SITE_OTHER): Payer: Medicare Other

## 2022-08-20 VITALS — BP 115/80 | HR 88 | Temp 98.3°F | Resp 16 | Ht 69.0 in | Wt 204.2 lb

## 2022-08-20 DIAGNOSIS — I10 Essential (primary) hypertension: Secondary | ICD-10-CM | POA: Diagnosis not present

## 2022-08-20 DIAGNOSIS — U071 COVID-19: Secondary | ICD-10-CM | POA: Diagnosis not present

## 2022-08-20 DIAGNOSIS — E1121 Type 2 diabetes mellitus with diabetic nephropathy: Secondary | ICD-10-CM

## 2022-08-20 DIAGNOSIS — Z0000 Encounter for general adult medical examination without abnormal findings: Secondary | ICD-10-CM

## 2022-08-20 DIAGNOSIS — M2550 Pain in unspecified joint: Secondary | ICD-10-CM | POA: Diagnosis not present

## 2022-08-20 LAB — COMPREHENSIVE METABOLIC PANEL
ALT: 42 U/L (ref 0–53)
AST: 26 U/L (ref 0–37)
Albumin: 4.2 g/dL (ref 3.5–5.2)
Alkaline Phosphatase: 79 U/L (ref 39–117)
BUN: 18 mg/dL (ref 6–23)
CO2: 29 mEq/L (ref 19–32)
Calcium: 9.4 mg/dL (ref 8.4–10.5)
Chloride: 107 mEq/L (ref 96–112)
Creatinine, Ser: 1.28 mg/dL (ref 0.40–1.50)
GFR: 58.61 mL/min — ABNORMAL LOW (ref >60.00)
Glucose, Bld: 90 mg/dL (ref 70–99)
Potassium: 3.8 mEq/L (ref 3.5–5.1)
Sodium: 141 mEq/L (ref 135–145)
Total Bilirubin: 1 mg/dL (ref 0.2–1.2)
Total Protein: 7.1 g/dL (ref 6.0–8.3)

## 2022-08-20 LAB — HEMOGLOBIN A1C: Hgb A1c MFr Bld: 6.8 % — ABNORMAL HIGH (ref 4.6–6.5)

## 2022-08-20 MED ORDER — CEFDINIR 300 MG PO CAPS: 300.0000 mg | ORAL_CAPSULE | Freq: Two times a day (BID) | ORAL | 0 refills | Status: DC

## 2022-08-20 MED ORDER — SEMAGLUTIDE (1 MG/DOSE) 4 MG/3ML ~~LOC~~ SOPN: 1.0000 mg | PEN_INJECTOR | SUBCUTANEOUS | 3 refills | Status: DC

## 2022-08-20 NOTE — Assessment & Plan Note (Signed)
Will increase Ozempic A1c today

## 2022-08-20 NOTE — Patient Instructions (Signed)
[name] , Thank you for taking time to come for your Medicare Wellness Visit. I appreciate your ongoing commitment to your health goals. Please review the following plan we discussed and let me know if I can assist you in the future.   These are the goals we discussed:  Goals      My goal is to lose 15 pounds.        This is a list of the screening recommended for you and due dates:  Health Maintenance  Topic Date Due   Complete foot exam   03/19/2014   COVID-19 Vaccine (5 - Pfizer risk series) 04/23/2021   Pneumonia Vaccine (3 - PPSV23 or PCV20) 11/03/2021   Yearly kidney health urinalysis for diabetes  12/13/2021   Hemoglobin A1C  10/23/2022   Yearly kidney function blood test for diabetes  04/24/2023   Eye exam for diabetics  07/10/2023   Tetanus Vaccine  08/03/2025   Colon Cancer Screening  01/08/2028   Flu Shot  Completed   Hepatitis C Screening: USPSTF Recommendation to screen - Ages 18-79 yo.  Completed   HIV Screening  Completed   Zoster (Shingles) Vaccine  Completed   HPV Vaccine  Aged Out    Advanced directives: Yes; Please bring a copy of your health care power of attorney and living will to the office at your convenience.  Conditions/risks identified: Yes; Type II Diabetes Mellitus  Next appointment: Follow up in one year for your annual wellness visit.   Preventive Care 29 Years and Older, Male  Preventive care refers to lifestyle choices and visits with your health care provider that can promote health and wellness. What does preventive care include? A yearly physical exam. This is also called an annual well check. Dental exams once or twice a year. Routine eye exams. Ask your health care provider how often you should have your eyes checked. Personal lifestyle choices, including: Daily care of your teeth and gums. Regular physical activity. Eating a healthy diet. Avoiding tobacco and drug use. Limiting alcohol use. Practicing safe sex. Taking low doses  of aspirin every day. Taking vitamin and mineral supplements as recommended by your health care provider. What happens during an annual well check? The services and screenings done by your health care provider during your annual well check will depend on your age, overall health, lifestyle risk factors, and family history of disease. Counseling  Your health care provider may ask you questions about your: Alcohol use. Tobacco use. Drug use. Emotional well-being. Home and relationship well-being. Sexual activity. Eating habits. History of falls. Memory and ability to understand (cognition). Work and work Statistician. Screening  You may have the following tests or measurements: Height, weight, and BMI. Blood pressure. Lipid and cholesterol levels. These may be checked every 5 years, or more frequently if you are over 67 years old. Skin check. Lung cancer screening. You may have this screening every year starting at age 56 if you have a 30-pack-year history of smoking and currently smoke or have quit within the past 15 years. Fecal occult blood test (FOBT) of the stool. You may have this test every year starting at age 71. Flexible sigmoidoscopy or colonoscopy. You may have a sigmoidoscopy every 5 years or a colonoscopy every 10 years starting at age 63. Prostate cancer screening. Recommendations will vary depending on your family history and other risks. Hepatitis C blood test. Hepatitis B blood test. Sexually transmitted disease (STD) testing. Diabetes screening. This is done by checking your  blood sugar (glucose) after you have not eaten for a while (fasting). You may have this done every 1-3 years. Abdominal aortic aneurysm (AAA) screening. You may need this if you are a current or former smoker. Osteoporosis. You may be screened starting at age 47 if you are at high risk. Talk with your health care provider about your test results, treatment options, and if necessary, the need for  more tests. Vaccines  Your health care provider may recommend certain vaccines, such as: Influenza vaccine. This is recommended every year. Tetanus, diphtheria, and acellular pertussis (Tdap, Td) vaccine. You may need a Td booster every 10 years. Zoster vaccine. You may need this after age 11. Pneumococcal 13-valent conjugate (PCV13) vaccine. One dose is recommended after age 71. Pneumococcal polysaccharide (PPSV23) vaccine. One dose is recommended after age 61. Talk to your health care provider about which screenings and vaccines you need and how often you need them. This information is not intended to replace advice given to you by your health care provider. Make sure you discuss any questions you have with your health care provider. Document Released: 11/17/2015 Document Revised: 07/10/2016 Document Reviewed: 08/22/2015 Elsevier Interactive Patient Education  2017 [place] Prevention in the Home Falls can cause injuries. They can happen to people of all ages. There are many things you can do to make your home safe and to help prevent falls. What can I do on the outside of my home? Regularly fix the edges of walkways and driveways and fix any cracks. Remove anything that might make you trip as you walk through a door, such as a raised step or threshold. Trim any bushes or trees on the path to your home. Use bright outdoor lighting. Clear any walking paths of anything that might make someone trip, such as rocks or tools. Regularly check to see if handrails are loose or broken. Make sure that both sides of any steps have handrails. Any raised decks and porches should have guardrails on the edges. Have any leaves, snow, or ice cleared regularly. Use sand or salt on walking paths during winter. Clean up any spills in your garage right away. This includes oil or grease spills. What can I do in the bathroom? Use night lights. Install grab bars by the toilet and in the tub and  shower. Do not use towel bars as grab bars. Use non-skid mats or decals in the tub or shower. If you need to sit down in the shower, use a plastic, non-slip stool. Keep the floor dry. Clean up any water that spills on the floor as soon as it happens. Remove soap buildup in the tub or shower regularly. Attach bath mats securely with double-sided non-slip rug tape. Do not have throw rugs and other things on the floor that can make you trip. What can I do in the bedroom? Use night lights. Make sure that you have a light by your bed that is easy to reach. Do not use any sheets or blankets that are too big for your bed. They should not hang down onto the floor. Have a firm chair that has side arms. You can use this for support while you get dressed. Do not have throw rugs and other things on the floor that can make you trip. What can I do in the kitchen? Clean up any spills right away. Avoid walking on wet floors. Keep items that you use a lot in easy-to-reach places. If you need to reach something above  you, use a strong step stool that has a grab bar. Keep electrical cords out of the way. Do not use floor polish or wax that makes floors slippery. If you must use wax, use non-skid floor wax. Do not have throw rugs and other things on the floor that can make you trip. What can I do with my stairs? Do not leave any items on the stairs. Make sure that there are handrails on both sides of the stairs and use them. Fix handrails that are broken or loose. Make sure that handrails are as long as the stairways. Check any carpeting to make sure that it is firmly attached to the stairs. Fix any carpet that is loose or worn. Avoid having throw rugs at the top or bottom of the stairs. If you do have throw rugs, attach them to the floor with carpet tape. Make sure that you have a light switch at the top of the stairs and the bottom of the stairs. If you do not have them, ask someone to add them for  you. What else can I do to help prevent falls? Wear shoes that: Do not have high heels. Have rubber bottoms. Are comfortable and fit you well. Are closed at the toe. Do not wear sandals. If you use a stepladder: Make sure that it is fully opened. Do not climb a closed stepladder. Make sure that both sides of the stepladder are locked into place. Ask someone to hold it for you, if possible. Clearly mark and make sure that you can see: Any grab bars or handrails. First and last steps. Where the edge of each step is. Use tools that help you move around (mobility aids) if they are needed. These include: Canes. Walkers. Scooters. Crutches. Turn on the lights when you go into a dark area. Replace any light bulbs as soon as they burn out. Set up your furniture so you have a clear path. Avoid moving your furniture around. If any of your floors are uneven, fix them. If there are any pets around you, be aware of where they are. Review your medicines with your doctor. Some medicines can make you feel dizzy. This can increase your chance of falling. Ask your doctor what other things that you can do to help prevent falls. This information is not intended to replace advice given to you by your health care provider. Make sure you discuss any questions you have with your health care provider. Document Released: 08/17/2009 Document Revised: 03/28/2016 Document Reviewed: 11/25/2014 Elsevier Interactive Patient Education  2017 [employer].

## 2022-08-20 NOTE — Progress Notes (Signed)
Subjective:  Patient ID: Jacob Gordon, male    DOB: [birth year]  Age: 66 y.o. MRN: [id number]  CC: Follow-up (2 month f/u)   HPI Jacob Gordon presents for COVID 1 month ago 07/2022 C/o congested Follow-up on diabetes, hypertension   Outpatient Medications Prior to Visit  Medication Sig Dispense Refill   amLODipine (NORVASC) 10 MG tablet TAKE 1 TABLET BY MOUTH EVERY DAY. 90 tablet 3   aspirin EC 81 MG tablet Take 81 mg by mouth daily.     atorvastatin (LIPITOR) 80 MG tablet TAKE 1 TABLET(80 MG) BY MOUTH DAILY 90 tablet 3   benzonatate (TESSALON) 100 MG capsule Take 1 capsule (100 mg total) by mouth 2 (two) times daily as needed for cough. 20 capsule 0   carvedilol (COREG) 12.5 MG tablet Take 1 tablet (12.5 mg total) by mouth 2 (two) times daily with a meal. 180 tablet 3   Cholecalciferol (VITAMIN D-3) 125 MCG (5000 UT) TABS Take 5,000 Units by mouth daily.     fluticasone (FLONASE) 50 MCG/ACT nasal spray Place 2 sprays into both nostrils daily. (Patient taking differently: Place 2 sprays into both nostrils daily as needed.) 16 g 1   Galcanezumab-gnlm (EMGALITY) 120 MG/ML SOAJ Inject 120 mg into the skin every 30 (thirty) days. 1.12 mL 11   HYDROcodone-acetaminophen (NORCO) 5-325 MG tablet Take 1 tablet by mouth every 4 (four) hours as needed for severe pain (Renal colic). 20 tablet 0   icosapent Ethyl (VASCEPA) 1 g capsule Take 2 capsules (2 g total) by mouth 2 (two) times daily. 360 capsule 1   meloxicam (MOBIC) 15 MG tablet Take 1 tablet (15 mg total) by mouth daily as needed for pain. 90 tablet 1   methocarbamol (ROBAXIN) 500 MG tablet Take 1 tablet (500 mg total) by mouth every 6 (six) hours as needed for muscle spasms. 40 tablet 0   OVER THE COUNTER MEDICATION Take 1 capsule by mouth daily. Suprema Dophilus Supplement - probiotic     oxyCODONE (ROXICODONE) 5 MG immediate release tablet Take 1 tablet (5 mg total) by mouth every 4 (four) hours as needed for severe pain. 5  tablet 0   pantoprazole (PROTONIX) 40 MG tablet TAKE 1 TABLET BY MOUTH DAILY 30 tablet 11   repaglinide (PRANDIN) 1 MG tablet Take 1 tablet (1 mg total) by mouth 3 (three) times daily before meals. 90 tablet 11   tadalafil (CIALIS) 5 MG tablet TAKE ONE TABLET BY MOUTH DAILY 90 tablet 1   tamsulosin (FLOMAX) 0.4 MG CAPS capsule Take 1 capsule (0.4 mg total) by mouth daily as needed. Renal colic 90 capsule 1   Ubrogepant (UBRELVY) 50 MG TABS Take 50mg  by mouth every 2 hours as needed for migraines   Maximum 100 mg daily 16 tablet 11   Semaglutide,0.25 or 0.5MG /DOS, (OZEMPIC, 0.25 OR 0.5 MG/DOSE,) 2 MG/1.5ML SOPN Inject 0.5 mg into the skin once a week. 3 mL 2   No facility-administered medications prior to visit.    ROS: Review of Systems  Constitutional:  Negative for appetite change, fatigue and unexpected weight change.  HENT:  Positive for congestion. Negative for nosebleeds, sneezing, sore throat and trouble swallowing.   Eyes:  Negative for itching and visual disturbance.  Respiratory:  Positive for cough.   Cardiovascular:  Negative for chest pain, palpitations and leg swelling.  Gastrointestinal:  Negative for abdominal distention, blood in stool, diarrhea and nausea.  Genitourinary:  Negative for frequency and hematuria.  Musculoskeletal:  Positive  for arthralgias. Negative for back pain, gait problem, joint swelling and neck pain.  Skin:  Negative for rash.  Neurological:  Negative for dizziness, tremors, speech difficulty and weakness.  Psychiatric/Behavioral:  Negative for agitation, dysphoric mood and sleep disturbance. The patient is not nervous/anxious.     Objective:  BP 115/80 (BP Location: Left Arm)   Pulse 88   Temp 98.3 F (36.8 C) (Oral)   Ht 5\' 9"  (1.753 m)   Wt 204 lb 4.8 oz (92.7 kg)   SpO2 97%   BMI 30.17 kg/m   BP Readings from Last 3 Encounters:  08/20/22 115/80  08/20/22 115/80  05/22/22 110/84    Wt Readings from Last 3 Encounters:  08/20/22 204  lb 4.8 oz (92.7 kg)  08/20/22 204 lb 3.2 oz (92.6 kg)  05/22/22 213 lb (96.6 kg)    Physical Exam Constitutional:      General: He is not in acute distress.    Appearance: He is well-developed.     Comments: NAD  Eyes:     Conjunctiva/sclera: Conjunctivae normal.     Pupils: Pupils are equal, round, and reactive to light.  Neck:     Thyroid: No thyromegaly.     Vascular: No JVD.  Cardiovascular:     Rate and Rhythm: Normal rate and regular rhythm.     Heart sounds: Normal heart sounds. No murmur heard.    No friction rub. No gallop.  Pulmonary:     Effort: Pulmonary effort is normal. No respiratory distress.     Breath sounds: Normal breath sounds. No wheezing or rales.  Chest:     Chest wall: No tenderness.  Abdominal:     General: Bowel sounds are normal. There is no distension.     Palpations: Abdomen is soft. There is no mass.     Tenderness: There is no abdominal tenderness. There is no guarding or rebound.  Musculoskeletal:        General: No tenderness. Normal range of motion.     Cervical back: Normal range of motion.  Lymphadenopathy:     Cervical: No cervical adenopathy.  Skin:    General: Skin is warm and dry.     Findings: No rash.  Neurological:     Mental Status: He is alert and oriented to person, place, and time.     Cranial Nerves: No cranial nerve deficit.     Motor: No abnormal muscle tone.     Coordination: Coordination normal.     Gait: Gait normal.     Deep Tendon Reflexes: Reflexes are normal and symmetric.  Psychiatric:        Behavior: Behavior normal.        Thought Content: Thought content normal.        Judgment: Judgment normal.     Lab Results  Component Value Date   WBC 6.6 12/24/2021   HGB 14.6 12/24/2021   HCT 44.6 12/24/2021   PLT 154 12/24/2021   GLUCOSE 90 08/20/2022   CHOL 116 04/23/2022   TRIG 63.0 04/23/2022   HDL 40.60 04/23/2022   LDLDIRECT 184.1 10/20/2012   LDLCALC 63 04/23/2022   ALT 42 08/20/2022   AST 26  08/20/2022   NA 141 08/20/2022   K 3.8 08/20/2022   CL 107 08/20/2022   CREATININE 1.28 08/20/2022   BUN 18 08/20/2022   CO2 29 08/20/2022   TSH 1.50 12/13/2020   PSA 1.13 12/13/2020   INR 1.2 07/22/2019   HGBA1C 6.8 (H) 08/20/2022  MICROALBUR 0.9 12/13/2020    CT RENAL STONE STUDY  Result Date: 12/27/2021 CLINICAL DATA:  Left renal colic. EXAM: CT ABDOMEN AND PELVIS WITHOUT CONTRAST TECHNIQUE: Multidetector CT imaging of the abdomen and pelvis was performed following the standard protocol without IV contrast. RADIATION DOSE REDUCTION: This exam was performed according to the departmental dose-optimization program which includes automated exposure control, adjustment of the mA and/or kV according to patient size and/or use of iterative reconstruction technique. COMPARISON:  CT with IV contrast 06/12/2015 FINDINGS: Lower chest: There is left lower lobe linear scarring or atelectasis. There are no lung base infiltrates. The cardiac size is normal. Small chronic pericardial effusion is noted anteriorly and calcification in the distal LAD and right coronary arteries. Hepatobiliary: Several scattered hepatic cysts. Largest in the right lobe is 2.7 cm and 7.4 Hounsfield units. Largest in the left lobe is 2.3 cm in 7.3 Hounsfield units. Some were present previously and larger than previously and some are new. There are additional too small to characterize scattered hypodensities. Gallbladder and bile ducts are unremarkable. Pancreas: Unremarkable without contrast. Spleen: Unremarkable without contrast. Adrenals/Urinary Tract: No adrenal mass is seen. Small cyst again noted posteriorly in the upper pole left kidney. The renal cortex otherwise unremarkable without contrast. There are several scattered punctate nonobstructive caliceal stones in the upper to midpole right kidney, and scattered punctate up to 1-2 mm stones in the left renal collecting system. No hydronephrosis or ureteral stones are seen  although please note the distal right ureter below the level of the acetabulum is obscured by metal artifact from a right hip arthroplasty. Bladder wall is thickened which was seen previously, but is not fully distended. Stomach/Bowel: No dilatation or wall thickening including the appendix. Moderate fecal stasis. There are colonic diverticula without evidence of colitis or diverticulitis. Vascular/Lymphatic: There is moderate aortoiliac calcific plaque. There is no AAA but there is ectasia in the common iliac arteries, which measure 1.7 cm on the right and 1.4 cm on the left. Scattered calcific plaque in the branch arteries. Reproductive: Enlarged prostate, measuring 4.5 cm with bladder impression. Other: Small umbilical fat hernia. No incarcerated hernia. No free air, hemorrhage or fluid. Musculoskeletal: There are chronic L5 pars defects without L5-S1 spondylolisthesis. No acute or significant osseous findings. Right hip arthroplasty. Left hip DJD. IMPRESSION: 1. There is nonobstructive micronephrolithiasis. No hydronephrosis or ureteral stone is seen, but with the most distal right ureter obscured by his right hip arthroplasty. 2. Thickened bladder which could be due to nondistention, hypertrophy or cystitis. 3. Prostatomegaly. 4. Aortic and coronary artery atherosclerosis. 5. Hepatic cysts and additional too small to characterize hypodensities. 6. Constipation with diverticulosis. 7. Umbilical fat hernia. Electronically Signed   By: Telford Nab M.D.   On: 12/27/2021 03:20    Assessment & Plan:   Problem List Items Addressed This Visit     Arthralgia   COVID-19    Post COVID bronchitis.  Start cefdinir      Relevant Medications   cefdinir (OMNICEF) 300 MG capsule   Diabetes mellitus type 2, controlled (HCC)    Will increase Ozempic A1c today      Relevant Medications   Semaglutide, 1 MG/DOSE, 4 MG/3ML SOPN   HTN (hypertension)    Chronic  Continue on amlodipine, Coreg         Meds  ordered this encounter  Medications   DISCONTD: Semaglutide, 1 MG/DOSE, 4 MG/3ML SOPN    Sig: Inject 1 mg as directed once a week.  Dispense:  3 mL    Refill:  3   Semaglutide, 1 MG/DOSE, 4 MG/3ML SOPN    Sig: Inject 1 mg as directed once a week.    Dispense:  3 mL    Refill:  3   cefdinir (OMNICEF) 300 MG capsule    Sig: Take 1 capsule (300 mg total) by mouth 2 (two) times daily.    Dispense:  20 capsule    Refill:  0      Follow-up: Return in about 3 months (around 11/20/2022) for a follow-up visit.  Sonda Primes, MD

## 2022-08-20 NOTE — Assessment & Plan Note (Signed)
Chronic  Continue on amlodipine, Coreg

## 2022-08-20 NOTE — Progress Notes (Addendum)
Subjective:   Jacob Gordon is a 66 y.o. male who presents for an Initial Medicare Annual Wellness Visit.  Review of Systems     Cardiac Risk Factors include: advanced age (>42men, >96 women);diabetes mellitus;dyslipidemia;hypertension;male gender;obesity (BMI >30kg/m2)     Objective:    Today's Vitals   08/20/22 1401  BP: 115/80  Pulse: 88  Resp: 16  Temp: 98.3 F (36.8 C)  TempSrc: Temporal  SpO2: 97%  Weight: 204 lb 3.2 oz (92.6 kg)  Height: 5\' 9"  (1.753 m)  PainSc: 0-No pain   Body mass index is 30.16 kg/m.     08/20/2022    2:10 PM 12/24/2021    9:01 AM 07/28/2019    4:27 PM 07/22/2019   11:52 AM 01/07/2018    7:15 AM  Advanced Directives  Does Patient Have a Medical Advance Directive? Yes No Yes Yes Yes  Type of Paramedic of [place];Living will  [place];Living will [place];Living will [place];Living will  Does patient want to make changes to medical advance directive?   No - Patient declined No - Patient declined No - Patient declined  Copy of [place] in Chart? No - copy requested  No - copy requested No - copy requested No - copy requested  Would patient like information on creating a medical advance directive?  No - Patient declined       Current Medications (verified) Outpatient Encounter Medications as of 08/20/2022  Medication Sig   amLODipine (NORVASC) 10 MG tablet TAKE 1 TABLET BY MOUTH EVERY DAY.   aspirin EC 81 MG tablet Take 81 mg by mouth daily.   atorvastatin (LIPITOR) 80 MG tablet TAKE 1 TABLET(80 MG) BY MOUTH DAILY   benzonatate (TESSALON) 100 MG capsule Take 1 capsule (100 mg total) by mouth 2 (two) times daily as needed for cough.   carvedilol (COREG) 12.5 MG tablet Take 1 tablet (12.5 mg total) by mouth 2 (two) times daily with a meal.   Cholecalciferol (VITAMIN D-3) 125 MCG (5000 UT) TABS Take 5,000 Units by mouth daily.    fluticasone (FLONASE) 50 MCG/ACT nasal spray Place 2 sprays into both nostrils daily. (Patient taking differently: Place 2 sprays into both nostrils daily as needed.)   Galcanezumab-gnlm (EMGALITY) 120 MG/ML SOAJ Inject 120 mg into the skin every 30 (thirty) days.   HYDROcodone-acetaminophen (NORCO) 5-325 MG tablet Take 1 tablet by mouth every 4 (four) hours as needed for severe pain (Renal colic).   icosapent Ethyl (VASCEPA) 1 g capsule Take 2 capsules (2 g total) by mouth 2 (two) times daily.   meloxicam (MOBIC) 15 MG tablet Take 1 tablet (15 mg total) by mouth daily as needed for pain.   methocarbamol (ROBAXIN) 500 MG tablet Take 1 tablet (500 mg total) by mouth every 6 (six) hours as needed for muscle spasms.   OVER THE COUNTER MEDICATION Take 1 capsule by mouth daily. Suprema Dophilus Supplement - probiotic   oxyCODONE (ROXICODONE) 5 MG immediate release tablet Take 1 tablet (5 mg total) by mouth every 4 (four) hours as needed for severe pain.   pantoprazole (PROTONIX) 40 MG tablet TAKE 1 TABLET BY MOUTH DAILY   repaglinide (PRANDIN) 1 MG tablet Take 1 tablet (1 mg total) by mouth 3 (three) times daily before meals.   Semaglutide,0.25 or 0.5MG /DOS, (OZEMPIC, 0.25 OR 0.5 MG/DOSE,) 2 MG/1.5ML SOPN Inject 0.5 mg into the skin once a week.   tadalafil (CIALIS)  5 MG tablet TAKE ONE TABLET BY MOUTH DAILY   tamsulosin (FLOMAX) 0.4 MG CAPS capsule Take 1 capsule (0.4 mg total) by mouth daily as needed. Renal colic   Ubrogepant (UBRELVY) 50 MG TABS Take 50mg  by mouth every 2 hours as needed for migraines   Maximum 100 mg daily   No facility-administered encounter medications on file as of 08/20/2022.    Allergies (verified) Tape, Metformin and related, Sulfa antibiotics, Valsartan, and Pravastatin sodium   History: Past Medical History:  Diagnosis Date   ALLERGIC RHINITIS    Chronic headaches    migraines   Complication of anesthesia    violent vomiting    Coronary artery disease    non  obstructive   DEGENERATIVE DISC DISEASE    DIABETES MELLITUS, TYPE II    type 2   GERD    History of kidney stones    HLD (hyperlipidemia)    HYPERTENSION    Internal hemorrhoids    OSTEOARTHRITIS    PONV (postoperative nausea and vomiting)    Sleep apnea    no cpap mild case   THROMBOCYTOPENIA    Past Surgical History:  Procedure Laterality Date   COLONOSCOPY  10/01/11   internal hemorrhoids   LEFT HEART CATH AND CORONARY ANGIOGRAPHY N/A 01/07/2018   Procedure: LEFT HEART CATH AND CORONARY ANGIOGRAPHY;  Surgeon: Burnell Blanks, MD;  Location: [facility];  Service: Cardiovascular;  Laterality: N/A;   SHOULDER SURGERY     right rotator cuff surg   TONSILLECTOMY AND ADENOIDECTOMY     TOTAL HIP ARTHROPLASTY Right 07/28/2019   Procedure: TOTAL HIP ARTHROPLASTY ANTERIOR APPROACH;  Surgeon: Gaynelle Arabian, MD;  Location: [facility];  Service: Orthopedics;  Laterality: Right;  12mins   VASECTOMY     Family History  Problem Relation Age of Onset   Diabetes Sister    Colon cancer Neg Hx    Social History   Socioeconomic History   Marital status: Married    Spouse name: Not on file   Number of children: 1   Years of education: Not on file   Highest education level: Not on file  Occupational History   Occupation: Administrative   Tobacco Use   Smoking status: Never   Smokeless tobacco: Never  Vaping Use   Vaping Use: Never used  Substance and Sexual Activity   Alcohol use: No   Drug use: No   Sexual activity: Yes  Other Topics Concern   Not on file  Social History Narrative   Caffeine daily    Social Determinants of Health   Financial Resource Strain: Low Risk  (08/20/2022)   Overall Financial Resource Strain (CARDIA)    Difficulty of Paying Living Expenses: Not hard at all  Food Insecurity: No Food Insecurity (08/20/2022)   Hunger Vital Sign    Worried About Running Out of Food in the Last Year: Never true    [place] in the Last Year: Never  true  Transportation Needs: No Transportation Needs (08/20/2022)   PRAPARE - Hydrologist (Medical): No    Lack of Transportation (Non-Medical): No  Physical Activity: Sufficiently Active (08/20/2022)   Exercise Vital Sign    Days of Exercise per Week: 5 days    Minutes of Exercise per Session: 60 min  Stress: No Stress Concern Present (08/20/2022)   [place]    Feeling of Stress : Not at all  Social Connections: Socially Integrated (08/20/2022)   Social Connection and Isolation Panel [NHANES]    Frequency of Communication with Friends and Family: More than three times a week    Frequency of Social Gatherings with Friends and Family: More than three times a week    Attends Religious Services: More than 4 times per year    Active Member of [employer] or Organizations: Yes    Attends Music therapist: More than 4 times per year    Marital Status: Married    Tobacco Counseling Counseling given: Not Answered   Clinical Intake:  Pre-visit preparation completed: Yes  Pain : No/denies pain Pain Score: 0-No pain     BMI - recorded: 30.16 Nutritional Status: BMI > 30  Obese Nutritional Risks: None Diabetes: Yes CBG done?: No Did pt. bring in CBG monitor from home?: No  How often do you need to have someone help you when you read instructions, pamphlets, or other written materials from your doctor or pharmacy?: 1 - Never What is the last grade level you completed in school?: HSG; some college; Military  Nutrition Risk Assessment:  Has the patient had any N/V/D within the last 2 months?  No  Does the patient have any non-healing wounds?  No  Has the patient had any unintentional weight loss or weight gain?  No   Diabetes:  Is the patient diabetic?  Yes  If diabetic, was a CBG obtained today?  No  Did the patient bring in their glucometer from home?  No  How often do you  monitor your CBG's? None.   Financial Strains and Diabetes Management:  Are you having any financial strains with the device, your supplies or your medication? No .  Does the patient want to be seen by Chronic Care Management for management of their diabetes?  No  Would the patient like to be referred to a Nutritionist or for Diabetic Management?  No   Diabetic Exams:  Diabetic Eye Exam: [facility] at [facility] Diabetic Foot Exam: Completed at [facility]   Interpreter Needed?: No  Information entered by :: Lisette Abu, LPN.   Activities of Daily Living    08/20/2022    2:31 PM  In your present state of health, do you have any difficulty performing the following activities:  Hearing? 0  Vision? 0  Difficulty concentrating or making decisions? 0  Walking or climbing stairs? 0  Dressing or bathing? 0  Doing errands, shopping? 0  Preparing Food and eating ? N  Using the Toilet? N  In the past six months, have you accidently leaked urine? N  Do you have problems with loss of bowel control? N  Managing your Medications? N  Managing your Finances? N  Housekeeping or managing your Housekeeping? N    Patient Care Team: Plotnikov, Evie Lacks, MD as PCP - General Jerline Pain, MD as PCP - Cardiology (Cardiology) Gaynelle Arabian, MD as Consulting Physician (Orthopedic Surgery) Melvenia Beam, MD as Consulting Physician (Neurology)  Indicate any recent Medical Services you may have received from other than [facility] providers in the past year (date may be approximate).     Assessment:   This is a routine wellness examination for [place].  Hearing/Vision screen Hearing Screening - Comments:: Denies hearing difficulties   Vision Screening - Comments:: Wears rx glasses - up to date with [facility] eye exams with [facility]   Dietary issues and exercise activities discussed: Current Exercise Habits: Home exercise routine, Type of exercise: walking, Time  (  Minutes): 60, Frequency (Times/Week): 5, Weekly Exercise (Minutes/Week): 300, Intensity: Moderate, Exercise limited by: None identified   Goals Addressed             This Visit's Progress    My goal is to lose 15 pounds.        Depression Screen    08/20/2022    2:29 PM 05/22/2022    2:15 PM 04/03/2022    4:20 PM 06/18/2021   11:22 AM 03/17/2017    9:51 AM  PHQ 2/9 Scores  PHQ - 2 Score 0 0 0 0 0  PHQ- 9 Score 6   0     Fall Risk    08/20/2022    2:31 PM 05/22/2022    2:15 PM 04/03/2022    4:20 PM  Fall Risk   Falls in the past year? 0 0 0  Number falls in past yr: 0  0  Injury with Fall? 0 0 0  Risk for fall due to : No Fall Risks  No Fall Risks  Follow up Falls prevention discussed  Falls evaluation completed    FALL RISK PREVENTION PERTAINING TO THE HOME:  Any stairs in or around the home? Yes  If so, are there any without handrails? No  Home free of loose throw rugs in walkways, pet beds, electrical cords, etc? Yes  Adequate lighting in your home to reduce risk of falls? Yes   ASSISTIVE DEVICES UTILIZED TO PREVENT FALLS:  Life alert? No  Use of a cane, walker or w/c? No  Grab bars in the bathroom? Yes  Shower chair or bench in shower? Yes  Elevated toilet seat or a handicapped toilet? Yes   TIMED UP AND GO:  Was the test performed? Yes .  Length of time to ambulate 10 feet: 6 sec.   Gait steady and fast without use of assistive device  Cognitive Function:        08/20/2022    2:02 PM  6CIT Screen  What Year? 0 points  What month? 0 points  What time? 0 points  Count back from 20 0 points  Months in reverse 0 points  Repeat phrase 0 points  Total Score 0 points    Immunizations Immunization History  Administered Date(s) Administered   Fluad Quad(high Dose 65+) 07/31/2022   Influenza Split 11/13/2011, 08/25/2012   Influenza, Seasonal, Injecte, Preservative Fre 07/29/2014   Influenza,inj,Quad PF,6+ Mos 07/27/2013, 10/03/2017, 08/11/2018,  07/20/2019, 07/30/2020   Influenza,inj,Quad PF,6-35 Mos 08/31/2021   Influenza,inj,quad, With Preservative 07/20/2019   Influenza-Unspecified 07/29/2014, 08/16/2015, 08/21/2016, 07/20/2019, 07/22/2019, 07/10/2020   PFIZER Comirnaty(Gray Top)Covid-19 Tri-Sucrose Vaccine 02/26/2021   PFIZER(Purple Top)SARS-COV-2 Vaccination 12/29/2019, 01/19/2020, 08/26/2020   Pneumococcal Conjugate-13 01/15/2019   Pneumococcal Polysaccharide-23 03/04/2006, 08/04/2015   Td 12/06/2005, 08/04/2015   Tdap 11/04/2005, 02/22/2015   Zoster Recombinat (Shingrix) 06/15/2020, 01/15/2021    TDAP status: Up to date  Flu Vaccine status: Up to date  Pneumococcal vaccine status: Due, Education has been provided regarding the importance of this vaccine. Advised may receive this vaccine at local pharmacy or Health Dept. Aware to provide a copy of the vaccination record if obtained from local pharmacy or Health Dept. Verbalized acceptance and understanding.  Covid-19 vaccine status: Completed vaccines  Qualifies for Shingles Vaccine? Yes   Zostavax completed No   Shingrix Completed?: Yes  Screening Tests Health Maintenance  Topic Date Due   FOOT EXAM  03/19/2014   COVID-19 Vaccine (5 - Pfizer risk series) 04/23/2021   Pneumonia Vaccine 65+  Years old (3 - PPSV23 or PCV20) 11/03/2021   Diabetic kidney evaluation - Urine ACR  12/13/2021   HEMOGLOBIN A1C  10/23/2022   Diabetic kidney evaluation - GFR measurement  04/24/2023   OPHTHALMOLOGY EXAM  07/10/2023   TETANUS/TDAP  08/03/2025   COLONOSCOPY (Pts 45-61yrs Insurance coverage will need to be confirmed)  01/08/2028   INFLUENZA VACCINE  Completed   Hepatitis C Screening  Completed   HIV Screening  Completed   Zoster Vaccines- Shingrix  Completed   HPV VACCINES  Aged Out    Health Maintenance  Health Maintenance Due  Topic Date Due   FOOT EXAM  03/19/2014   COVID-19 Vaccine (5 - Pfizer risk series) 04/23/2021   Pneumonia Vaccine 80+ Years old (42 - PPSV23  or PCV20) 11/03/2021   Diabetic kidney evaluation - Urine ACR  12/13/2021    Colorectal cancer screening: Type of screening: Colonoscopy. Completed 01/07/2018. Repeat every 10 years  Lung Cancer Screening: (Low Dose CT Chest recommended if Age 11-80 years, 30 pack-year currently smoking OR have quit w/in 15years.) does not qualify.   Lung Cancer Screening Referral: no  Additional Screening:  Hepatitis C Screening: does qualify; Completed 01/31/2010  Vision Screening: Recommended annual ophthalmology exams for early detection of glaucoma and other disorders of the eye. Is the patient up to date with their annual eye exam?  Yes  Who is the provider or what is the name of the office in which the patient attends annual eye exams? [facility] If pt is not established with a provider, would they like to be referred to a provider to establish care? No .   Dental Screening: Recommended annual dental exams for proper oral hygiene  Community Resource Referral / Chronic Care Management: CRR required this visit?  No   CCM required this visit?  No      Plan:     I have personally reviewed and noted the following in the patient's chart:   Medical and social history Use of alcohol, tobacco or illicit drugs  Current medications and supplements including opioid prescriptions. Patient is currently taking opioid prescriptions. Information provided to patient regarding non-opioid alternatives. Patient advised to discuss non-opioid treatment plan with their provider. Functional ability and status Nutritional status Physical activity Advanced directives List of other physicians Hospitalizations, surgeries, and ER visits in previous 12 months Vitals Screenings to include cognitive, depression, and falls Referrals and appointments  In addition, I have reviewed and discussed with patient certain preventive protocols, quality metrics, and best practice recommendations. A written personalized  care plan for preventive services as well as general preventive health recommendations were provided to patient.     Sheral Flow, LPN   89/21/1941   Nurse Notes: N/A   Medical screening examination/treatment/procedure(s) were performed by non-physician practitioner and as supervising physician I was immediately available for consultation/collaboration.  I agree with above. Lew Dawes, MD

## 2022-09-02 NOTE — Assessment & Plan Note (Signed)
Post COVID bronchitis.  Start cefdinir

## 2022-09-02 NOTE — Assessment & Plan Note (Signed)
Chronic arthralgia.  Continue blue emu cream, Aleve as needed

## 2022-09-05 DIAGNOSIS — H52222 Regular astigmatism, left eye: Secondary | ICD-10-CM | POA: Diagnosis not present

## 2022-09-05 DIAGNOSIS — H5203 Hypermetropia, bilateral: Secondary | ICD-10-CM | POA: Diagnosis not present

## 2022-09-05 DIAGNOSIS — E119 Type 2 diabetes mellitus without complications: Secondary | ICD-10-CM | POA: Diagnosis not present

## 2022-09-05 DIAGNOSIS — H524 Presbyopia: Secondary | ICD-10-CM | POA: Diagnosis not present

## 2022-10-17 ENCOUNTER — Encounter: Payer: Self-pay | Admitting: Internal Medicine

## 2022-10-31 NOTE — Telephone Encounter (Signed)
Place letter on MD desk for signature.Marland KitchenRaechel Chute

## 2022-11-01 NOTE — Telephone Encounter (Signed)
Called pt to see if he is going to pick letter up. He states his wife will pick up inform will leave up front.Marland KitchenRaechel Chute

## 2022-11-19 ENCOUNTER — Encounter: Payer: Self-pay | Admitting: Internal Medicine

## 2022-11-19 ENCOUNTER — Ambulatory Visit (INDEPENDENT_AMBULATORY_CARE_PROVIDER_SITE_OTHER): Payer: Medicare Other | Admitting: Internal Medicine

## 2022-11-19 VITALS — BP 110/78 | HR 79 | Temp 98.1°F | Ht 69.0 in | Wt 203.6 lb

## 2022-11-19 DIAGNOSIS — N200 Calculus of kidney: Secondary | ICD-10-CM

## 2022-11-19 DIAGNOSIS — R5382 Chronic fatigue, unspecified: Secondary | ICD-10-CM

## 2022-11-19 DIAGNOSIS — E1121 Type 2 diabetes mellitus with diabetic nephropathy: Secondary | ICD-10-CM | POA: Diagnosis not present

## 2022-11-19 DIAGNOSIS — R5383 Other fatigue: Secondary | ICD-10-CM | POA: Diagnosis not present

## 2022-11-19 DIAGNOSIS — G4709 Other insomnia: Secondary | ICD-10-CM

## 2022-11-19 DIAGNOSIS — R202 Paresthesia of skin: Secondary | ICD-10-CM | POA: Diagnosis not present

## 2022-11-19 LAB — CBC WITH DIFFERENTIAL/PLATELET
Basophils Absolute: 0 10*3/uL (ref 0.0–0.1)
Basophils Relative: 0.4 % (ref 0.0–3.0)
Eosinophils Absolute: 0.1 10*3/uL (ref 0.0–0.7)
Eosinophils Relative: 1.3 % (ref 0.0–5.0)
HCT: 43.3 % (ref 39.0–52.0)
Hemoglobin: 14.3 g/dL (ref 13.0–17.0)
Lymphocytes Relative: 26.9 % (ref 12.0–46.0)
Lymphs Abs: 1.6 10*3/uL (ref 0.7–4.0)
MCHC: 32.9 g/dL (ref 30.0–36.0)
MCV: 91.1 fl (ref 78.0–100.0)
Monocytes Absolute: 0.7 10*3/uL (ref 0.1–1.0)
Monocytes Relative: 11.3 % (ref 3.0–12.0)
Neutro Abs: 3.5 10*3/uL (ref 1.4–7.7)
Neutrophils Relative %: 60.1 % (ref 43.0–77.0)
Platelets: 140 10*3/uL — ABNORMAL LOW (ref 150.0–400.0)
RBC: 4.76 Mil/uL (ref 4.22–5.81)
RDW: 15.9 % — ABNORMAL HIGH (ref 11.5–15.5)
WBC: 5.9 10*3/uL (ref 4.0–10.5)

## 2022-11-19 LAB — COMPREHENSIVE METABOLIC PANEL
ALT: 28 U/L (ref 0–53)
AST: 22 U/L (ref 0–37)
Albumin: 4.2 g/dL (ref 3.5–5.2)
Alkaline Phosphatase: 90 U/L (ref 39–117)
BUN: 15 mg/dL (ref 6–23)
CO2: 29 mEq/L (ref 19–32)
Calcium: 9.3 mg/dL (ref 8.4–10.5)
Chloride: 105 mEq/L (ref 96–112)
Creatinine, Ser: 1.28 mg/dL (ref 0.40–1.50)
GFR: 58.51 mL/min — ABNORMAL LOW (ref >60.00)
Glucose, Bld: 88 mg/dL (ref 70–99)
Potassium: 3.9 mEq/L (ref 3.5–5.1)
Sodium: 142 mEq/L (ref 135–145)
Total Bilirubin: 0.8 mg/dL (ref 0.2–1.2)
Total Protein: 6.9 g/dL (ref 6.0–8.3)

## 2022-11-19 LAB — URINALYSIS
Bilirubin Urine: NEGATIVE
Hgb urine dipstick: NEGATIVE
Ketones, ur: NEGATIVE
Leukocytes,Ua: NEGATIVE
Nitrite: NEGATIVE
Specific Gravity, Urine: 1.02 (ref 1.000–1.030)
Total Protein, Urine: NEGATIVE
Urine Glucose: NEGATIVE
Urobilinogen, UA: 0.2 (ref 0.0–1.0)
pH: 6.5 (ref 5.0–8.0)

## 2022-11-19 LAB — TSH: TSH: 0.72 u[IU]/mL (ref 0.35–5.50)

## 2022-11-19 LAB — TESTOSTERONE: Testosterone: 243.19 ng/dL — ABNORMAL LOW (ref 300.00–890.00)

## 2022-11-19 LAB — VITAMIN B12: Vitamin B-12: >1500 pg/mL — ABNORMAL HIGH (ref 211–911)

## 2022-11-19 MED ORDER — ZOLPIDEM TARTRATE 10 MG PO TABS: 5.0000 mg | ORAL_TABLET | Freq: Every evening | ORAL | 5 refills | Status: DC | PRN

## 2022-11-19 NOTE — Progress Notes (Signed)
Subjective:  Patient ID: Jacob Gordon, male    DOB: 1955-11-17  Age: 67 y.o. MRN: [id number]  CC: Follow-up (3 month Follow up. ) and Fatigue (For several weeks with some back pain. )   HPI [name] presents for fatigue, insomnia, weeks  Outpatient Medications Prior to Visit  Medication Sig Dispense Refill   amLODipine (NORVASC) 10 MG tablet TAKE 1 TABLET BY MOUTH EVERY DAY. 90 tablet 3   aspirin EC 81 MG tablet Take 81 mg by mouth daily.     atorvastatin (LIPITOR) 80 MG tablet TAKE 1 TABLET(80 MG) BY MOUTH DAILY 90 tablet 3   benzonatate (TESSALON) 100 MG capsule Take 1 capsule (100 mg total) by mouth 2 (two) times daily as needed for cough. 20 capsule 0   carvedilol (COREG) 12.5 MG tablet Take 1 tablet (12.5 mg total) by mouth 2 (two) times daily with a meal. 180 tablet 3   cefdinir (OMNICEF) 300 MG capsule Take 1 capsule (300 mg total) by mouth 2 (two) times daily. 20 capsule 0   Cholecalciferol (VITAMIN D-3) 125 MCG (5000 UT) TABS Take 5,000 Units by mouth daily.     fluticasone (FLONASE) 50 MCG/ACT nasal spray Place 2 sprays into both nostrils daily. (Patient taking differently: Place 2 sprays into both nostrils daily as needed.) 16 g 1   Galcanezumab-gnlm (EMGALITY) 120 MG/ML SOAJ Inject 120 mg into the skin every 30 (thirty) days. 1.12 mL 11   HYDROcodone-acetaminophen (NORCO) 5-325 MG tablet Take 1 tablet by mouth every 4 (four) hours as needed for severe pain (Renal colic). 20 tablet 0   icosapent Ethyl (VASCEPA) 1 g capsule Take 2 capsules (2 g total) by mouth 2 (two) times daily. 360 capsule 1   meloxicam (MOBIC) 15 MG tablet Take 1 tablet (15 mg total) by mouth daily as needed for pain. 90 tablet 1   methocarbamol (ROBAXIN) 500 MG tablet Take 1 tablet (500 mg total) by mouth every 6 (six) hours as needed for muscle spasms. 40 tablet 0   OVER THE COUNTER MEDICATION Take 1 capsule by mouth daily. Suprema Dophilus Supplement - probiotic     pantoprazole  (PROTONIX) 40 MG tablet TAKE 1 TABLET BY MOUTH DAILY 30 tablet 11   repaglinide (PRANDIN) 1 MG tablet Take 1 tablet (1 mg total) by mouth 3 (three) times daily before meals. 90 tablet 11   Semaglutide, 1 MG/DOSE, 4 MG/3ML SOPN Inject 1 mg as directed once a week. 3 mL 3   tadalafil (CIALIS) 5 MG tablet TAKE ONE TABLET BY MOUTH DAILY 90 tablet 1   tamsulosin (FLOMAX) 0.4 MG CAPS capsule Take 1 capsule (0.4 mg total) by mouth daily as needed. Renal colic 90 capsule 1   Ubrogepant (UBRELVY) 50 MG TABS Take 50mg  by mouth every 2 hours as needed for migraines   Maximum 100 mg daily 16 tablet 11   oxyCODONE (ROXICODONE) 5 MG immediate release tablet Take 1 tablet (5 mg total) by mouth every 4 (four) hours as needed for severe pain. 5 tablet 0   No facility-administered medications prior to visit.    ROS: Review of Systems  Constitutional:  Positive for fatigue. Negative for appetite change and unexpected weight change.  HENT:  Negative for congestion, nosebleeds, sneezing, sore throat and trouble swallowing.   Eyes:  Negative for itching and visual disturbance.  Respiratory:  Negative for cough.   Cardiovascular:  Negative for chest pain, palpitations and leg swelling.  Gastrointestinal:  Negative for abdominal  distention, blood in stool, diarrhea and nausea.  Genitourinary:  Negative for frequency and hematuria.  Musculoskeletal:  Negative for back pain, gait problem, joint swelling and neck pain.  Skin:  Negative for rash.  Neurological:  Negative for dizziness, tremors, speech difficulty and weakness.  Psychiatric/Behavioral:  Negative for agitation, dysphoric mood and sleep disturbance. The patient is not nervous/anxious.     Objective:  BP 110/78 (BP Location: Left Arm, Patient Position: Sitting, Cuff Size: Large)   Pulse 79   Temp 98.1 F (36.7 C) (Oral)   Ht 5\' 9"  (1.753 m)   Wt 203 lb 9.6 oz (92.4 kg)   SpO2 96%   BMI 30.07 kg/m   BP Readings from Last 3 Encounters:  11/19/22  110/78  08/20/22 115/80  08/20/22 115/80    Wt Readings from Last 3 Encounters:  11/19/22 203 lb 9.6 oz (92.4 kg)  08/20/22 204 lb 4.8 oz (92.7 kg)  08/20/22 204 lb 3.2 oz (92.6 kg)    Physical Exam  Lab Results  Component Value Date   WBC 6.6 12/24/2021   HGB 14.6 12/24/2021   HCT 44.6 12/24/2021   PLT 154 12/24/2021   GLUCOSE 90 08/20/2022   CHOL 116 04/23/2022   TRIG 63.0 04/23/2022   HDL 40.60 04/23/2022   LDLDIRECT 184.1 10/20/2012   LDLCALC 63 04/23/2022   ALT 42 08/20/2022   AST 26 08/20/2022   NA 141 08/20/2022   K 3.8 08/20/2022   CL 107 08/20/2022   CREATININE 1.28 08/20/2022   BUN 18 08/20/2022   CO2 29 08/20/2022   TSH 1.50 12/13/2020   PSA 1.13 12/13/2020   INR 1.2 07/22/2019   HGBA1C 6.8 (H) 08/20/2022   MICROALBUR 0.9 12/13/2020    CT RENAL STONE STUDY  Result Date: 12/27/2021 CLINICAL DATA:  Left renal colic. EXAM: CT ABDOMEN AND PELVIS WITHOUT CONTRAST TECHNIQUE: Multidetector CT imaging of the abdomen and pelvis was performed following the standard protocol without IV contrast. RADIATION DOSE REDUCTION: This exam was performed according to the departmental dose-optimization program which includes automated exposure control, adjustment of the mA and/or kV according to patient size and/or use of iterative reconstruction technique. COMPARISON:  CT with IV contrast 06/12/2015 FINDINGS: Lower chest: There is left lower lobe linear scarring or atelectasis. There are no lung base infiltrates. The cardiac size is normal. Small chronic pericardial effusion is noted anteriorly and calcification in the distal LAD and right coronary arteries. Hepatobiliary: Several scattered hepatic cysts. Largest in the right lobe is 2.7 cm and 7.4 Hounsfield units. Largest in the left lobe is 2.3 cm in 7.3 Hounsfield units. Some were present previously and larger than previously and some are new. There are additional too small to characterize scattered hypodensities. Gallbladder and  bile ducts are unremarkable. Pancreas: Unremarkable without contrast. Spleen: Unremarkable without contrast. Adrenals/Urinary Tract: No adrenal mass is seen. Small cyst again noted posteriorly in the upper pole left kidney. The renal cortex otherwise unremarkable without contrast. There are several scattered punctate nonobstructive caliceal stones in the upper to midpole right kidney, and scattered punctate up to 1-2 mm stones in the left renal collecting system. No hydronephrosis or ureteral stones are seen although please note the distal right ureter below the level of the acetabulum is obscured by metal artifact from a right hip arthroplasty. Bladder wall is thickened which was seen previously, but is not fully distended. Stomach/Bowel: No dilatation or wall thickening including the appendix. Moderate fecal stasis. There are colonic diverticula without evidence of  colitis or diverticulitis. Vascular/Lymphatic: There is moderate aortoiliac calcific plaque. There is no AAA but there is ectasia in the common iliac arteries, which measure 1.7 cm on the right and 1.4 cm on the left. Scattered calcific plaque in the branch arteries. Reproductive: Enlarged prostate, measuring 4.5 cm with bladder impression. Other: Small umbilical fat hernia. No incarcerated hernia. No free air, hemorrhage or fluid. Musculoskeletal: There are chronic L5 pars defects without L5-S1 spondylolisthesis. No acute or significant osseous findings. Right hip arthroplasty. Left hip DJD. IMPRESSION: 1. There is nonobstructive micronephrolithiasis. No hydronephrosis or ureteral stone is seen, but with the most distal right ureter obscured by his right hip arthroplasty. 2. Thickened bladder which could be due to nondistention, hypertrophy or cystitis. 3. Prostatomegaly. 4. Aortic and coronary artery atherosclerosis. 5. Hepatic cysts and additional too small to characterize hypodensities. 6. Constipation with diverticulosis. 7. Umbilical fat hernia.  Electronically Signed   By: Almira Bar M.D.   On: 12/27/2021 03:20    Assessment & Plan:   Problem List Items Addressed This Visit       Endocrine   Diabetes mellitus type 2, controlled (HCC) - Primary    Monitor A1c      Relevant Orders   TSH   Urinalysis   Comprehensive metabolic panel   Vitamin B12   Testosterone     Genitourinary   Kidney stone    No relapse        Other   Paresthesia   Relevant Orders   TSH   Vitamin B12   Insomnia    Worse Start Zolpidem prn  Potential benefits of a long term benzodiazepines  use as well as potential risks  and complications were explained to the patient and were aknowledged. Weighted blanket      Fatigue    Treat insomnia Check labs      Relevant Orders   CBC with Differential/Platelet   TSH   Urinalysis   Comprehensive metabolic panel   Vitamin B12   Testosterone      Meds ordered this encounter  Medications   zolpidem (AMBIEN) 10 MG tablet    Sig: Take 0.5-1 tablets (5-10 mg total) by mouth at bedtime as needed for sleep.    Dispense:  30 tablet    Refill:  5      Follow-up: Return in about 3 months (around 02/18/2023) for a follow-up visit.  Sonda Primes, MD

## 2022-11-19 NOTE — Assessment & Plan Note (Signed)
Worse Start Zolpidem prn  Potential benefits of a long term benzodiazepines  use as well as potential risks  and complications were explained to the patient and were aknowledged. Weighted blanket

## 2022-11-19 NOTE — Assessment & Plan Note (Signed)
Treat insomnia Check labs

## 2022-11-19 NOTE — Assessment & Plan Note (Signed)
No relapse 

## 2022-11-19 NOTE — Assessment & Plan Note (Signed)
Monitor A1c 

## 2022-11-26 DIAGNOSIS — L820 Inflamed seborrheic keratosis: Secondary | ICD-10-CM | POA: Diagnosis not present

## 2022-11-26 DIAGNOSIS — L821 Other seborrheic keratosis: Secondary | ICD-10-CM | POA: Diagnosis not present

## 2022-11-26 DIAGNOSIS — D2372 Other benign neoplasm of skin of left lower limb, including hip: Secondary | ICD-10-CM | POA: Diagnosis not present

## 2022-11-26 DIAGNOSIS — D239 Other benign neoplasm of skin, unspecified: Secondary | ICD-10-CM | POA: Diagnosis not present

## 2022-11-26 DIAGNOSIS — D485 Neoplasm of uncertain behavior of skin: Secondary | ICD-10-CM | POA: Diagnosis not present

## 2022-12-17 ENCOUNTER — Encounter: Payer: Self-pay | Admitting: Internal Medicine

## 2023-01-02 DIAGNOSIS — K080 Exfoliation of teeth due to systemic causes: Secondary | ICD-10-CM | POA: Diagnosis not present

## 2023-01-07 DIAGNOSIS — K080 Exfoliation of teeth due to systemic causes: Secondary | ICD-10-CM | POA: Diagnosis not present

## 2023-01-21 ENCOUNTER — Encounter: Payer: Self-pay | Admitting: Internal Medicine

## 2023-01-21 NOTE — Telephone Encounter (Signed)
No lab orders in epic. Pls advise.Marland KitchenJohny Chess

## 2023-01-22 ENCOUNTER — Other Ambulatory Visit: Payer: Self-pay | Admitting: Internal Medicine

## 2023-01-22 DIAGNOSIS — E1121 Type 2 diabetes mellitus with diabetic nephropathy: Secondary | ICD-10-CM

## 2023-01-22 DIAGNOSIS — R7989 Other specified abnormal findings of blood chemistry: Secondary | ICD-10-CM

## 2023-01-22 NOTE — Progress Notes (Unsigned)
labs

## 2023-01-30 ENCOUNTER — Other Ambulatory Visit (INDEPENDENT_AMBULATORY_CARE_PROVIDER_SITE_OTHER): Payer: Medicare Other

## 2023-01-30 DIAGNOSIS — E1121 Type 2 diabetes mellitus with diabetic nephropathy: Secondary | ICD-10-CM | POA: Diagnosis not present

## 2023-01-30 DIAGNOSIS — R7989 Other specified abnormal findings of blood chemistry: Secondary | ICD-10-CM

## 2023-01-30 LAB — COMPREHENSIVE METABOLIC PANEL
ALT: 33 U/L (ref 0–53)
AST: 28 U/L (ref 0–37)
Albumin: 4.2 g/dL (ref 3.5–5.2)
Alkaline Phosphatase: 96 U/L (ref 39–117)
BUN: 15 mg/dL (ref 6–23)
CO2: 31 mEq/L (ref 19–32)
Calcium: 9.5 mg/dL (ref 8.4–10.5)
Chloride: 106 mEq/L (ref 96–112)
Creatinine, Ser: 1.34 mg/dL (ref 0.40–1.50)
GFR: 55.31 mL/min — ABNORMAL LOW (ref >60.00)
Glucose, Bld: 91 mg/dL (ref 70–99)
Potassium: 3.8 mEq/L (ref 3.5–5.1)
Sodium: 142 mEq/L (ref 135–145)
Total Bilirubin: 0.9 mg/dL (ref 0.2–1.2)
Total Protein: 7.1 g/dL (ref 6.0–8.3)

## 2023-01-30 LAB — TESTOSTERONE: Testosterone: 285.63 ng/dL — ABNORMAL LOW (ref 300.00–890.00)

## 2023-01-30 LAB — HEMOGLOBIN A1C: Hgb A1c MFr Bld: 6.7 % — ABNORMAL HIGH (ref 4.6–6.5)

## 2023-02-04 ENCOUNTER — Other Ambulatory Visit: Payer: Self-pay | Admitting: Internal Medicine

## 2023-02-18 ENCOUNTER — Ambulatory Visit (INDEPENDENT_AMBULATORY_CARE_PROVIDER_SITE_OTHER): Payer: Medicare Other | Admitting: Internal Medicine

## 2023-02-18 ENCOUNTER — Encounter: Payer: Self-pay | Admitting: Internal Medicine

## 2023-02-18 VITALS — BP 122/80 | HR 82 | Temp 98.2°F | Ht 69.0 in | Wt 200.0 lb

## 2023-02-18 DIAGNOSIS — E291 Testicular hypofunction: Secondary | ICD-10-CM | POA: Diagnosis not present

## 2023-02-18 DIAGNOSIS — M6281 Muscle weakness (generalized): Secondary | ICD-10-CM

## 2023-02-18 DIAGNOSIS — R7989 Other specified abnormal findings of blood chemistry: Secondary | ICD-10-CM | POA: Diagnosis not present

## 2023-02-18 DIAGNOSIS — E1121 Type 2 diabetes mellitus with diabetic nephropathy: Secondary | ICD-10-CM | POA: Diagnosis not present

## 2023-02-18 MED ORDER — SEMAGLUTIDE (2 MG/DOSE) 8 MG/3ML ~~LOC~~ SOPN: 2.0000 mg | PEN_INJECTOR | SUBCUTANEOUS | 3 refills | Status: DC

## 2023-02-18 MED ORDER — "BD INTEGRA SYRINGE 23G X 1"" 3 ML MISC": 0 refills | Status: DC

## 2023-02-18 MED ORDER — TESTOSTERONE CYPIONATE 200 MG/ML IM SOLN: 200.0000 mg | INTRAMUSCULAR | 5 refills | Status: DC

## 2023-02-18 NOTE — Assessment & Plan Note (Signed)
  Will increase Ozempic to 2 mg/d A1c today

## 2023-02-18 NOTE — Assessment & Plan Note (Signed)
Options discussed Start on testosterone inj q 2 wks  Potential benefits of a long term sex steroid  use as well as potential risks  and complications were explained to the patient and were aknowledged.

## 2023-02-18 NOTE — Assessment & Plan Note (Signed)
Start Testosterone

## 2023-02-18 NOTE — Progress Notes (Signed)
Subjective:  Patient ID: Jacob Gordon, male    DOB: 10-06-1956  Age: 66 y.o. MRN: [id number]  CC: No chief complaint on file.   HPI Jacob Gordon presents for hypogonadism, DM  Outpatient Medications Prior to Visit  Medication Sig Dispense Refill   amLODipine (NORVASC) 10 MG tablet TAKE 1 TABLET BY MOUTH EVERY DAY. 90 tablet 3   aspirin EC 81 MG tablet Take 81 mg by mouth daily.     atorvastatin (LIPITOR) 80 MG tablet TAKE 1 TABLET(80 MG) BY MOUTH DAILY 90 tablet 3   benzonatate (TESSALON) 100 MG capsule Take 1 capsule (100 mg total) by mouth 2 (two) times daily as needed for cough. 20 capsule 0   carvedilol (COREG) 12.5 MG tablet Take 1 tablet (12.5 mg total) by mouth 2 (two) times daily with a meal. 180 tablet 3   cefdinir (OMNICEF) 300 MG capsule Take 1 capsule (300 mg total) by mouth 2 (two) times daily. 20 capsule 0   Cholecalciferol (VITAMIN D-3) 125 MCG (5000 UT) TABS Take 5,000 Units by mouth daily.     fluticasone (FLONASE) 50 MCG/ACT nasal spray Place 2 sprays into both nostrils daily. (Patient taking differently: Place 2 sprays into both nostrils daily as needed.) 16 g 1   Galcanezumab-gnlm (EMGALITY) 120 MG/ML SOAJ Inject 120 mg into the skin every 30 (thirty) days. 1.12 mL 11   HYDROcodone-acetaminophen (NORCO) 5-325 MG tablet Take 1 tablet by mouth every 4 (four) hours as needed for severe pain (Renal colic). 20 tablet 0   icosapent Ethyl (VASCEPA) 1 g capsule Take 2 capsules (2 g total) by mouth 2 (two) times daily. 360 capsule 1   meloxicam (MOBIC) 15 MG tablet Take 1 tablet (15 mg total) by mouth daily as needed for pain. 90 tablet 1   methocarbamol (ROBAXIN) 500 MG tablet Take 1 tablet (500 mg total) by mouth every 6 (six) hours as needed for muscle spasms. 40 tablet 0   OVER THE COUNTER MEDICATION Take 1 capsule by mouth daily. Suprema Dophilus Supplement - probiotic     pantoprazole (PROTONIX) 40 MG tablet TAKE 1 TABLET BY MOUTH DAILY 30 tablet 11    repaglinide (PRANDIN) 1 MG tablet Take 1 tablet (1 mg total) by mouth 3 (three) times daily before meals. 90 tablet 11   tadalafil (CIALIS) 5 MG tablet TAKE ONE TABLET BY MOUTH DAILY 90 tablet 1   tamsulosin (FLOMAX) 0.4 MG CAPS capsule Take 1 capsule (0.4 mg total) by mouth daily as needed. Renal colic 90 capsule 1   Ubrogepant (UBRELVY) 50 MG TABS Take  by mouth every 2 hours as needed for migraines   Maximum 100 mg daily 16 tablet 11   zolpidem (AMBIEN) 10 MG tablet Take 0.5-1 tablets (5-10 mg total) by mouth at bedtime as needed for sleep. 30 tablet 5   Semaglutide, 1 MG/DOSE, 4 MG/3ML SOPN Inject 1 mg as directed once a week. 3 mL 3   No facility-administered medications prior to visit.    ROS: Review of Systems  Constitutional:  Negative for appetite change, fatigue and unexpected weight change.  HENT:  Negative for congestion, nosebleeds, sneezing, sore throat and trouble swallowing.   Eyes:  Negative for itching and visual disturbance.  Respiratory:  Negative for cough.   Cardiovascular:  Negative for chest pain, palpitations and leg swelling.  Gastrointestinal:  Negative for abdominal distention, blood in stool, diarrhea and nausea.  Genitourinary:  Negative for frequency and hematuria.  Musculoskeletal:  Positive  for arthralgias. Negative for back pain, gait problem, joint swelling and neck pain.  Skin:  Negative for rash.  Neurological:  Negative for dizziness, tremors, speech difficulty and weakness.  Psychiatric/Behavioral:  Negative for agitation, dysphoric mood and sleep disturbance. The patient is not nervous/anxious.     Objective:  BP 122/80 (BP Location: Left Arm, Patient Position: Sitting, Cuff Size: Normal)   Pulse 82   Temp 98.2 F (36.8 C) (Oral)   Ht 5\' 9"  (1.753 m)   Wt 200 lb (90.7 kg)   SpO2 97%   BMI 29.53 kg/m   BP Readings from Last 3 Encounters:  02/18/23 122/80  11/19/22 110/78  08/20/22 115/80    Wt Readings from Last 3 Encounters:   02/18/23 200 lb (90.7 kg)  11/19/22 203 lb 9.6 oz (92.4 kg)  08/20/22 204 lb 4.8 oz (92.7 kg)    Physical Exam Constitutional:      General: He is not in acute distress.    Appearance: He is well-developed.     Comments: NAD  Eyes:     Conjunctiva/sclera: Conjunctivae normal.     Pupils: Pupils are equal, round, and reactive to light.  Neck:     Thyroid: No thyromegaly.     Vascular: No JVD.  Cardiovascular:     Rate and Rhythm: Normal rate and regular rhythm.     Heart sounds: Normal heart sounds. No murmur heard.    No friction rub. No gallop.  Pulmonary:     Effort: Pulmonary effort is normal. No respiratory distress.     Breath sounds: Normal breath sounds. No wheezing or rales.  Chest:     Chest wall: No tenderness.  Abdominal:     General: Bowel sounds are normal. There is no distension.     Palpations: Abdomen is soft. There is no mass.     Tenderness: There is no abdominal tenderness. There is no guarding or rebound.  Musculoskeletal:        General: No tenderness. Normal range of motion.     Cervical back: Normal range of motion.  Lymphadenopathy:     Cervical: No cervical adenopathy.  Skin:    General: Skin is warm and dry.     Findings: No rash.  Neurological:     Mental Status: He is alert and oriented to person, place, and time.     Cranial Nerves: No cranial nerve deficit.     Motor: No abnormal muscle tone.     Coordination: Coordination normal.     Gait: Gait normal.     Deep Tendon Reflexes: Reflexes are normal and symmetric.  Psychiatric:        Behavior: Behavior normal.        Thought Content: Thought content normal.        Judgment: Judgment normal.     Lab Results  Component Value Date   WBC 5.9 11/19/2022   HGB 14.3 11/19/2022   HCT 43.3 11/19/2022   PLT 140.0 (L) 11/19/2022   GLUCOSE 91 01/30/2023   CHOL 116 04/23/2022   TRIG 63.0 04/23/2022   HDL 40.60 04/23/2022   LDLDIRECT 184.1 10/20/2012   LDLCALC 63 04/23/2022   ALT 33  01/30/2023   AST 28 01/30/2023   NA 142 01/30/2023   K 3.8 01/30/2023   CL 106 01/30/2023   CREATININE 1.34 01/30/2023   BUN 15 01/30/2023   CO2 31 01/30/2023   TSH 0.72 11/19/2022   PSA 1.13 12/13/2020   INR 1.2 07/22/2019  HGBA1C 6.7 (H) 01/30/2023   MICROALBUR 0.9 12/13/2020    CT RENAL STONE STUDY  Result Date: 12/27/2021 CLINICAL DATA:  Left renal colic. EXAM: CT ABDOMEN AND PELVIS WITHOUT CONTRAST TECHNIQUE: Multidetector CT imaging of the abdomen and pelvis was performed following the standard protocol without IV contrast. RADIATION DOSE REDUCTION: This exam was performed according to the departmental dose-optimization program which includes automated exposure control, adjustment of the mA and/or kV according to patient size and/or use of iterative reconstruction technique. COMPARISON:  CT with IV contrast 06/12/2015 FINDINGS: Lower chest: There is left lower lobe linear scarring or atelectasis. There are no lung base infiltrates. The cardiac size is normal. Small chronic pericardial effusion is noted anteriorly and calcification in the distal LAD and right coronary arteries. Hepatobiliary: Several scattered hepatic cysts. Largest in the right lobe is 2.7 cm and 7.4 Hounsfield units. Largest in the left lobe is 2.3 cm in 7.3 Hounsfield units. Some were present previously and larger than previously and some are new. There are additional too small to characterize scattered hypodensities. Gallbladder and bile ducts are unremarkable. Pancreas: Unremarkable without contrast. Spleen: Unremarkable without contrast. Adrenals/Urinary Tract: No adrenal mass is seen. Small cyst again noted posteriorly in the upper pole left kidney. The renal cortex otherwise unremarkable without contrast. There are several scattered punctate nonobstructive caliceal stones in the upper to midpole right kidney, and scattered punctate up to 1-2 mm stones in the left renal collecting system. No hydronephrosis or ureteral  stones are seen although please note the distal right ureter below the level of the acetabulum is obscured by metal artifact from a right hip arthroplasty. Bladder wall is thickened which was seen previously, but is not fully distended. Stomach/Bowel: No dilatation or wall thickening including the appendix. Moderate fecal stasis. There are colonic diverticula without evidence of colitis or diverticulitis. Vascular/Lymphatic: There is moderate aortoiliac calcific plaque. There is no AAA but there is ectasia in the common iliac arteries, which measure 1.7 cm on the right and 1.4 cm on the left. Scattered calcific plaque in the branch arteries. Reproductive: Enlarged prostate, measuring 4.5 cm with bladder impression. Other: Small umbilical fat hernia. No incarcerated hernia. No free air, hemorrhage or fluid. Musculoskeletal: There are chronic L5 pars defects without L5-S1 spondylolisthesis. No acute or significant osseous findings. Right hip arthroplasty. Left hip DJD. IMPRESSION: 1. There is nonobstructive micronephrolithiasis. No hydronephrosis or ureteral stone is seen, but with the most distal right ureter obscured by his right hip arthroplasty. 2. Thickened bladder which could be due to nondistention, hypertrophy or cystitis. 3. Prostatomegaly. 4. Aortic and coronary artery atherosclerosis. 5. Hepatic cysts and additional too small to characterize hypodensities. 6. Constipation with diverticulosis. 7. Umbilical fat hernia. Electronically Signed   By: Almira Bar M.D.   On: 12/27/2021 03:20    Assessment & Plan:   Problem List Items Addressed This Visit       Endocrine   Diabetes mellitus type 2, controlled - Primary     Will increase Ozempic to 2 mg/d A1c today      Relevant Medications   Semaglutide, 2 MG/DOSE, 8 MG/3ML SOPN   Other Relevant Orders   Hemoglobin A1c   Hypogonadism male    Options discussed Start on testosterone inj q 2 wks  Potential benefits of a long term sex steroid   use as well as potential risks  and complications were explained to the patient and were aknowledged.        Other   Muscle  weakness (generalized)    Start Testosterone      Relevant Orders   Comprehensive metabolic panel   Testosterone   Other Visit Diagnoses     Low testosterone       Relevant Orders   Comprehensive metabolic panel   Testosterone         Meds ordered this encounter  Medications   testosterone cypionate (DEPOTESTOSTERONE CYPIONATE) 200 MG/ML injection    Sig: Inject 1 mL (200 mg total) into the muscle every 14 (fourteen) days.    Dispense:  10 mL    Refill:  5   SYRINGE-NEEDLE, DISP, 3 ML (B-D INTEGRA SYRINGE) 23G X 1" 3 ML MISC    Sig: For IM injections    Dispense:  50 each    Refill:  0   Semaglutide, 2 MG/DOSE, 8 MG/3ML SOPN    Sig: Inject 2 mg as directed once a week.    Dispense:  3 mL    Refill:  3      Follow-up: Return in about 3 months (around 05/20/2023) for a follow-up visit.  Sonda Primes, MD

## 2023-02-27 ENCOUNTER — Encounter: Payer: Self-pay | Admitting: Internal Medicine

## 2023-02-28 MED ORDER — SEMAGLUTIDE (2 MG/DOSE) 8 MG/3ML ~~LOC~~ SOPN: 2.0000 mg | PEN_INJECTOR | SUBCUTANEOUS | 3 refills | Status: DC

## 2023-03-03 ENCOUNTER — Telehealth: Payer: Self-pay | Admitting: Internal Medicine

## 2023-03-03 ENCOUNTER — Other Ambulatory Visit (HOSPITAL_COMMUNITY): Payer: Self-pay

## 2023-03-03 NOTE — Telephone Encounter (Signed)
Patient needs a prior authorization for testosterone cypionate (DEPOTESTOSTERONE CYPIONATE) 200 MG/ML injection  A representative, Patience Musca, from his insurance called and left her number [phone], option 2. She also gave a PA line [phone].

## 2023-03-03 NOTE — Telephone Encounter (Signed)
Submitted PA w/ (Key: [id number]). Waiting on insurance response../l;mb

## 2023-03-04 NOTE — Telephone Encounter (Signed)
Office manager, [place], called and said the PA is approved. It is effective 03/03/23 and is good for 1 year. She will fax confirmation to both the [facility] fax and the prior authorization fax. Best callback for Konrad Penta is [phone], option 5.

## 2023-03-04 NOTE — Telephone Encounter (Signed)
Noted.Rhina Brackett msg via mychart with approval status.Marland KitchenRaechel Chute

## 2023-03-12 DIAGNOSIS — K080 Exfoliation of teeth due to systemic causes: Secondary | ICD-10-CM | POA: Diagnosis not present

## 2023-04-01 ENCOUNTER — Encounter: Payer: Self-pay | Admitting: Internal Medicine

## 2023-04-01 DIAGNOSIS — K080 Exfoliation of teeth due to systemic causes: Secondary | ICD-10-CM | POA: Diagnosis not present

## 2023-04-01 MED ORDER — SEMAGLUTIDE (2 MG/DOSE) 8 MG/3ML ~~LOC~~ SOPN: 2.0000 mg | PEN_INJECTOR | SUBCUTANEOUS | 3 refills | Status: DC

## 2023-04-22 ENCOUNTER — Other Ambulatory Visit: Payer: Self-pay

## 2023-04-22 ENCOUNTER — Other Ambulatory Visit: Payer: Self-pay | Admitting: Internal Medicine

## 2023-04-22 MED ORDER — ATORVASTATIN CALCIUM 80 MG PO TABS: ORAL_TABLET | ORAL | 0 refills | Status: DC

## 2023-05-12 ENCOUNTER — Telehealth: Payer: Self-pay | Admitting: *Deleted

## 2023-05-12 NOTE — Telephone Encounter (Signed)
Rec'd fax pt needing PA on Ozempic. Submitted w/ (Key: [id number]) PA sent to [facility].Marland KitchenRaechel Chute

## 2023-05-13 NOTE — Telephone Encounter (Signed)
Check status on PA still pending../lmb 

## 2023-05-14 NOTE — Telephone Encounter (Signed)
Rec'd msg med has been Approved. . Authorization Expiration Date: May 12, 2024...Jacob Gordon

## 2023-05-21 ENCOUNTER — Encounter: Payer: Self-pay | Admitting: Internal Medicine

## 2023-05-21 ENCOUNTER — Ambulatory Visit (INDEPENDENT_AMBULATORY_CARE_PROVIDER_SITE_OTHER): Payer: Medicare Other | Admitting: Internal Medicine

## 2023-05-21 VITALS — BP 110/80 | HR 83 | Temp 98.5°F | Ht 69.0 in | Wt 197.0 lb

## 2023-05-21 DIAGNOSIS — R7989 Other specified abnormal findings of blood chemistry: Secondary | ICD-10-CM

## 2023-05-21 DIAGNOSIS — E291 Testicular hypofunction: Secondary | ICD-10-CM | POA: Diagnosis not present

## 2023-05-21 DIAGNOSIS — I10 Essential (primary) hypertension: Secondary | ICD-10-CM

## 2023-05-21 DIAGNOSIS — G4709 Other insomnia: Secondary | ICD-10-CM | POA: Diagnosis not present

## 2023-05-21 DIAGNOSIS — E1121 Type 2 diabetes mellitus with diabetic nephropathy: Secondary | ICD-10-CM

## 2023-05-21 DIAGNOSIS — M6281 Muscle weakness (generalized): Secondary | ICD-10-CM | POA: Diagnosis not present

## 2023-05-21 DIAGNOSIS — K219 Gastro-esophageal reflux disease without esophagitis: Secondary | ICD-10-CM

## 2023-05-21 LAB — COMPREHENSIVE METABOLIC PANEL
ALT: 19 U/L (ref 0–53)
AST: 18 U/L (ref 0–37)
Albumin: 4 g/dL (ref 3.5–5.2)
Alkaline Phosphatase: 78 U/L (ref 39–117)
BUN: 13 mg/dL (ref 6–23)
CO2: 29 mEq/L (ref 19–32)
Calcium: 9.3 mg/dL (ref 8.4–10.5)
Chloride: 105 mEq/L (ref 96–112)
Creatinine, Ser: 1.39 mg/dL (ref 0.40–1.50)
GFR: 52.81 mL/min — ABNORMAL LOW (ref >60.00)
Glucose, Bld: 100 mg/dL — ABNORMAL HIGH (ref 70–99)
Potassium: 3.6 mEq/L (ref 3.5–5.1)
Sodium: 141 mEq/L (ref 135–145)
Total Bilirubin: 1.2 mg/dL (ref 0.2–1.2)
Total Protein: 6.8 g/dL (ref 6.0–8.3)

## 2023-05-21 LAB — TESTOSTERONE: Testosterone: 883.27 ng/dL (ref 300.00–890.00)

## 2023-05-21 LAB — HEMOGLOBIN A1C: Hgb A1c MFr Bld: 6.3 % (ref 4.6–6.5)

## 2023-05-21 NOTE — Assessment & Plan Note (Signed)
Chronic  Continue on amlodipine, Coreg

## 2023-05-21 NOTE — Progress Notes (Signed)
Subjective:  Patient ID: Jacob Gordon, male    DOB: 05/09/1956  Age: 67 y.o. MRN: [id number]  CC: Follow-up (3 mnth f/u)   HPI Jacob Gordon presents for DM, HTN, HYPOGONADISM  Outpatient Medications Prior to Visit  Medication Sig Dispense Refill   amLODipine (NORVASC) 10 MG tablet TAKE 1 TABLET BY MOUTH EVERY DAY. 90 tablet 3   aspirin EC 81 MG tablet Take 81 mg by mouth daily.     atorvastatin (LIPITOR) 80 MG tablet TAKE 1 TABLET(80 MG) BY MOUTH DAILY 90 tablet 0   cefdinir (OMNICEF) 300 MG capsule Take 1 capsule (300 mg total) by mouth 2 (two) times daily. 20 capsule 0   Cholecalciferol (VITAMIN D-3) 125 MCG (5000 UT) TABS Take 5,000 Units by mouth daily.     fluticasone (FLONASE) 50 MCG/ACT nasal spray Place 2 sprays into both nostrils daily. (Patient taking differently: Place 2 sprays into both nostrils daily as needed.) 16 g 1   Galcanezumab-gnlm (EMGALITY) 120 MG/ML SOAJ Inject 120 mg into the skin every 30 (thirty) days. 1.12 mL 11   HYDROcodone-acetaminophen (NORCO) 5-325 MG tablet Take 1 tablet by mouth every 4 (four) hours as needed for severe pain (Renal colic). 20 tablet 0   icosapent Ethyl (VASCEPA) 1 g capsule TAKE TWO CAPSULES BY MOUTH TWICE A DAY 360 capsule 1   meloxicam (MOBIC) 15 MG tablet Take 1 tablet (15 mg total) by mouth daily as needed for pain. 90 tablet 1   methocarbamol (ROBAXIN) 500 MG tablet Take 1 tablet (500 mg total) by mouth every 6 (six) hours as needed for muscle spasms. 40 tablet 0   OVER THE COUNTER MEDICATION Take 1 capsule by mouth daily. Suprema Dophilus Supplement - probiotic     pantoprazole (PROTONIX) 40 MG tablet TAKE 1 TABLET BY MOUTH DAILY 30 tablet 11   repaglinide (PRANDIN) 1 MG tablet Take 1 tablet (1 mg total) by mouth 3 (three) times daily before meals. 90 tablet 11   Semaglutide, 2 MG/DOSE, 8 MG/3ML SOPN Inject 2 mg as directed once a week. 3 mL 3   SYRINGE-NEEDLE, DISP, 3 ML (B-D INTEGRA SYRINGE) 23G X 1" 3 ML MISC For  IM injections 50 each 0   tadalafil (CIALIS) 5 MG tablet TAKE ONE TABLET BY MOUTH DAILY 90 tablet 1   tamsulosin (FLOMAX) 0.4 MG CAPS capsule Take 1 capsule (0.4 mg total) by mouth daily as needed. Renal colic 90 capsule 1   testosterone cypionate (DEPOTESTOSTERONE CYPIONATE) 200 MG/ML injection Inject 1 mL (200 mg total) into the muscle every 14 (fourteen) days. 10 mL 5   Ubrogepant (UBRELVY) 50 MG TABS Take 50mg  by mouth every 2 hours as needed for migraines   Maximum 100 mg daily 16 tablet 11   zolpidem (AMBIEN) 10 MG tablet Take 0.5-1 tablets (5-10 mg total) by mouth at bedtime as needed for sleep. 30 tablet 5   benzonatate (TESSALON) 100 MG capsule Take 1 capsule (100 mg total) by mouth 2 (two) times daily as needed for cough. (Patient not taking: Reported on 05/21/2023) 20 capsule 0   carvedilol (COREG) 12.5 MG tablet Take 1 tablet (12.5 mg total) by mouth 2 (two) times daily with a meal. 180 tablet 3   No facility-administered medications prior to visit.    ROS: Review of Systems  Constitutional:  Negative for appetite change, fatigue and unexpected weight change.  HENT:  Negative for congestion, nosebleeds, sneezing, sore throat and trouble swallowing.   Eyes:  Negative  for itching and visual disturbance.  Respiratory:  Negative for cough.   Cardiovascular:  Negative for chest pain, palpitations and leg swelling.  Gastrointestinal:  Negative for abdominal distention, blood in stool, diarrhea and nausea.  Genitourinary:  Negative for frequency and hematuria.  Musculoskeletal:  Positive for arthralgias and back pain. Negative for gait problem, joint swelling and neck pain.  Skin:  Negative for rash.  Neurological:  Negative for dizziness, tremors, speech difficulty and weakness.  Psychiatric/Behavioral:  Negative for agitation, dysphoric mood and sleep disturbance. The patient is not nervous/anxious.     Objective:  BP 110/80 (BP Location: Left Arm, Patient Position: Sitting, Cuff  Size: Large)   Pulse 83   Temp 98.5 F (36.9 C) (Oral)   Ht 5\' 9"  (1.753 m)   Wt 197 lb (89.4 kg)   SpO2 98%   BMI 29.09 kg/m   BP Readings from Last 3 Encounters:  05/21/23 110/80  02/18/23 122/80  11/19/22 110/78    Wt Readings from Last 3 Encounters:  05/21/23 197 lb (89.4 kg)  02/18/23 200 lb (90.7 kg)  11/19/22 203 lb 9.6 oz (92.4 kg)    Physical Exam Constitutional:      General: He is not in acute distress.    Appearance: Normal appearance. He is well-developed.     Comments: NAD  Eyes:     Conjunctiva/sclera: Conjunctivae normal.     Pupils: Pupils are equal, round, and reactive to light.  Neck:     Thyroid: No thyromegaly.     Vascular: No JVD.  Cardiovascular:     Rate and Rhythm: Normal rate and regular rhythm.     Heart sounds: Normal heart sounds. No murmur heard.    No friction rub. No gallop.  Pulmonary:     Effort: Pulmonary effort is normal. No respiratory distress.     Breath sounds: Normal breath sounds. No wheezing or rales.  Chest:     Chest wall: No tenderness.  Abdominal:     General: Bowel sounds are normal. There is no distension.     Palpations: Abdomen is soft. There is no mass.     Tenderness: There is no abdominal tenderness. There is no guarding or rebound.  Musculoskeletal:        General: No tenderness. Normal range of motion.     Cervical back: Normal range of motion.  Lymphadenopathy:     Cervical: No cervical adenopathy.  Skin:    General: Skin is warm and dry.     Findings: No rash.  Neurological:     Mental Status: He is alert and oriented to person, place, and time.     Cranial Nerves: No cranial nerve deficit.     Motor: No abnormal muscle tone.     Coordination: Coordination normal.     Gait: Gait normal.     Deep Tendon Reflexes: Reflexes are normal and symmetric.  Psychiatric:        Behavior: Behavior normal.        Thought Content: Thought content normal.        Judgment: Judgment normal.     Lab Results   Component Value Date   WBC 5.9 11/19/2022   HGB 14.3 11/19/2022   HCT 43.3 11/19/2022   PLT 140.0 (L) 11/19/2022   GLUCOSE 91 01/30/2023   CHOL 116 04/23/2022   TRIG 63.0 04/23/2022   HDL 40.60 04/23/2022   LDLDIRECT 184.1 10/20/2012   LDLCALC 63 04/23/2022   ALT 33 01/30/2023  AST 28 01/30/2023   NA 142 01/30/2023   K 3.8 01/30/2023   CL 106 01/30/2023   CREATININE 1.34 01/30/2023   BUN 15 01/30/2023   CO2 31 01/30/2023   TSH 0.72 11/19/2022   PSA 1.13 12/13/2020   INR 1.2 07/22/2019   HGBA1C 6.7 (H) 01/30/2023   MICROALBUR 0.9 12/13/2020    CT RENAL STONE STUDY  Result Date: 12/27/2021 CLINICAL DATA:  Left renal colic. EXAM: CT ABDOMEN AND PELVIS WITHOUT CONTRAST TECHNIQUE: Multidetector CT imaging of the abdomen and pelvis was performed following the standard protocol without IV contrast. RADIATION DOSE REDUCTION: This exam was performed according to the departmental dose-optimization program which includes automated exposure control, adjustment of the mA and/or kV according to patient size and/or use of iterative reconstruction technique. COMPARISON:  CT with IV contrast 06/12/2015 FINDINGS: Lower chest: There is left lower lobe linear scarring or atelectasis. There are no lung base infiltrates. The cardiac size is normal. Small chronic pericardial effusion is noted anteriorly and calcification in the distal LAD and right coronary arteries. Hepatobiliary: Several scattered hepatic cysts. Largest in the right lobe is 2.7 cm and 7.4 Hounsfield units. Largest in the left lobe is 2.3 cm in 7.3 Hounsfield units. Some were present previously and larger than previously and some are new. There are additional too small to characterize scattered hypodensities. Gallbladder and bile ducts are unremarkable. Pancreas: Unremarkable without contrast. Spleen: Unremarkable without contrast. Adrenals/Urinary Tract: No adrenal mass is seen. Small cyst again noted posteriorly in the upper pole left  kidney. The renal cortex otherwise unremarkable without contrast. There are several scattered punctate nonobstructive caliceal stones in the upper to midpole right kidney, and scattered punctate up to 1-2 mm stones in the left renal collecting system. No hydronephrosis or ureteral stones are seen although please note the distal right ureter below the level of the acetabulum is obscured by metal artifact from a right hip arthroplasty. Bladder wall is thickened which was seen previously, but is not fully distended. Stomach/Bowel: No dilatation or wall thickening including the appendix. Moderate fecal stasis. There are colonic diverticula without evidence of colitis or diverticulitis. Vascular/Lymphatic: There is moderate aortoiliac calcific plaque. There is no AAA but there is ectasia in the common iliac arteries, which measure 1.7 cm on the right and 1.4 cm on the left. Scattered calcific plaque in the branch arteries. Reproductive: Enlarged prostate, measuring 4.5 cm with bladder impression. Other: Small umbilical fat hernia. No incarcerated hernia. No free air, hemorrhage or fluid. Musculoskeletal: There are chronic L5 pars defects without L5-S1 spondylolisthesis. No acute or significant osseous findings. Right hip arthroplasty. Left hip DJD. IMPRESSION: 1. There is nonobstructive micronephrolithiasis. No hydronephrosis or ureteral stone is seen, but with the most distal right ureter obscured by his right hip arthroplasty. 2. Thickened bladder which could be due to nondistention, hypertrophy or cystitis. 3. Prostatomegaly. 4. Aortic and coronary artery atherosclerosis. 5. Hepatic cysts and additional too small to characterize hypodensities. 6. Constipation with diverticulosis. 7. Umbilical fat hernia. Electronically Signed   By: Almira Bar M.D.   On: 12/27/2021 03:20    Assessment & Plan:   Problem List Items Addressed This Visit     HTN (hypertension) - Primary    Chronic  Continue on amlodipine,  Coreg      GERD (gastroesophageal reflux disease)    On Nexium  Better after we d/c Metformin?      Insomnia    Start Zolpidem prn  Potential benefits of a long term  benzodiazepines  use as well as potential risks  and complications were explained to the patient and were aknowledged.      Hypogonadism male    Cont on testosterone inj q 2 wks  Potential benefits of a long term sex steroid  use as well as potential risks  and complications were explained to the patient and were aknowledged.         No orders of the defined types were placed in this encounter.     Follow-up: Return in about 3 months (around 08/21/2023) for a follow-up visit.  Sonda Primes, MD

## 2023-05-21 NOTE — Assessment & Plan Note (Signed)
Start Zolpidem prn  Potential benefits of a long term benzodiazepines  use as well as potential risks  and complications were explained to the patient and were aknowledged.

## 2023-05-21 NOTE — Assessment & Plan Note (Signed)
On Nexium  Better after we d/c Metformin?

## 2023-05-21 NOTE — Assessment & Plan Note (Signed)
Aleve prn Using Biofreeze

## 2023-05-21 NOTE — Assessment & Plan Note (Signed)
Cont on testosterone inj q 2 wks  Potential benefits of a long term sex steroid  use as well as potential risks  and complications were explained to the patient and were aknowledged.

## 2023-06-14 ENCOUNTER — Other Ambulatory Visit: Payer: Self-pay | Admitting: Internal Medicine

## 2023-06-15 ENCOUNTER — Other Ambulatory Visit: Payer: Self-pay | Admitting: Nurse Practitioner

## 2023-06-15 ENCOUNTER — Other Ambulatory Visit: Payer: Self-pay | Admitting: Internal Medicine

## 2023-07-22 NOTE — Progress Notes (Addendum)
This encounter was created in error - please disregard. I called patient and got voicemail.  I waited 10 mins and called patient back, with no answer.  I left a detailed message for patient to call office back and reschedule visit.  Medical screening examination/treatment/procedure(s) were performed by non-physician practitioner and as supervising physician I was immediately available for consultation/collaboration.  I agree with above. Jacinta Shoe, MD

## 2023-07-28 ENCOUNTER — Other Ambulatory Visit: Payer: Self-pay

## 2023-07-28 MED ORDER — ATORVASTATIN CALCIUM 80 MG PO TABS: ORAL_TABLET | ORAL | 0 refills | Status: DC

## 2023-08-03 ENCOUNTER — Other Ambulatory Visit: Payer: Self-pay | Admitting: Cardiology

## 2023-08-04 ENCOUNTER — Encounter: Payer: Self-pay | Admitting: Cardiology

## 2023-08-04 ENCOUNTER — Telehealth: Payer: Self-pay | Admitting: Cardiology

## 2023-08-04 MED ORDER — ATORVASTATIN CALCIUM 80 MG PO TABS: ORAL_TABLET | ORAL | 0 refills | Status: DC

## 2023-08-04 NOTE — Telephone Encounter (Signed)
*  STAT* If patient is at the pharmacy, call can be transferred to refill team.   1. Which medications need to be refilled? (please list name of each medication and dose if known) atorvastatin (LIPITOR) 80 MG tablet    2. Would you like to learn more about the convenience, safety, & potential cost savings by using the [facility]? No      3. Are you open to using the [facility] (Type [facility]. No  ).   4. Which pharmacy/location (including street and city if local pharmacy) is medication to be sent to? [facility] [id number] - [place], [place] - [address]    5. Do they need a 30 day or 90 day supply? 90

## 2023-08-11 ENCOUNTER — Other Ambulatory Visit: Payer: Self-pay | Admitting: Internal Medicine

## 2023-08-21 ENCOUNTER — Encounter: Payer: Self-pay | Admitting: Internal Medicine

## 2023-08-21 DIAGNOSIS — E1121 Type 2 diabetes mellitus with diabetic nephropathy: Secondary | ICD-10-CM

## 2023-08-25 ENCOUNTER — Other Ambulatory Visit (INDEPENDENT_AMBULATORY_CARE_PROVIDER_SITE_OTHER): Payer: Medicare Other

## 2023-08-25 DIAGNOSIS — E1121 Type 2 diabetes mellitus with diabetic nephropathy: Secondary | ICD-10-CM

## 2023-08-25 LAB — COMPREHENSIVE METABOLIC PANEL
ALT: 17 U/L (ref 0–53)
AST: 18 U/L (ref 0–37)
Albumin: 4.1 g/dL (ref 3.5–5.2)
Alkaline Phosphatase: 74 U/L (ref 39–117)
BUN: 15 mg/dL (ref 6–23)
CO2: 28 meq/L (ref 19–32)
Calcium: 9.1 mg/dL (ref 8.4–10.5)
Chloride: 106 meq/L (ref 96–112)
Creatinine, Ser: 1.3 mg/dL (ref 0.40–1.50)
GFR: 57.13 mL/min — ABNORMAL LOW (ref >60.00)
Glucose, Bld: 104 mg/dL — ABNORMAL HIGH (ref 70–99)
Potassium: 4 meq/L (ref 3.5–5.1)
Sodium: 141 meq/L (ref 135–145)
Total Bilirubin: 1 mg/dL (ref 0.2–1.2)
Total Protein: 6.9 g/dL (ref 6.0–8.3)

## 2023-08-25 LAB — MICROALBUMIN / CREATININE URINE RATIO
Creatinine,U: 198.3 mg/dL
Microalb Creat Ratio: 0.7 mg/g (ref 0.0–30.0)
Microalb, Ur: 1.3 mg/dL (ref 0.0–1.9)

## 2023-08-25 LAB — HEMOGLOBIN A1C: Hgb A1c MFr Bld: 6.5 % (ref 4.6–6.5)

## 2023-08-26 ENCOUNTER — Ambulatory Visit (INDEPENDENT_AMBULATORY_CARE_PROVIDER_SITE_OTHER): Payer: Medicare Other | Admitting: Internal Medicine

## 2023-08-26 ENCOUNTER — Encounter: Payer: Self-pay | Admitting: Internal Medicine

## 2023-08-26 VITALS — BP 120/70 | HR 81 | Temp 98.0°F | Ht 69.0 in | Wt 192.0 lb

## 2023-08-26 DIAGNOSIS — E1121 Type 2 diabetes mellitus with diabetic nephropathy: Secondary | ICD-10-CM | POA: Diagnosis not present

## 2023-08-26 DIAGNOSIS — D696 Thrombocytopenia, unspecified: Secondary | ICD-10-CM

## 2023-08-26 DIAGNOSIS — R110 Nausea: Secondary | ICD-10-CM

## 2023-08-26 DIAGNOSIS — I10 Essential (primary) hypertension: Secondary | ICD-10-CM | POA: Diagnosis not present

## 2023-08-26 MED ORDER — SEMAGLUTIDE (2 MG/DOSE) 8 MG/3ML ~~LOC~~ SOPN: 2.0000 mg | PEN_INJECTOR | SUBCUTANEOUS | 3 refills | Status: DC

## 2023-08-26 MED ORDER — TESTOSTERONE CYPIONATE 200 MG/ML IM SOLN: 200.0000 mg | INTRAMUSCULAR | 5 refills | Status: DC

## 2023-08-26 NOTE — Progress Notes (Signed)
Subjective:  Patient ID: Jacob Gordon, male    DOB: Jul 26, 1956  Age: 67 y.o. MRN: [id number]  CC: Follow-up (3 mnth f/u)   HPI Tatum Theodies Rhein presents for DM, HTN, CRI C/o occ nausea F/u on hypogonadism   Outpatient Medications Prior to Visit  Medication Sig Dispense Refill   amLODipine (NORVASC) 10 MG tablet TAKE 1 TABLET BY MOUTH EVERY DAY. 90 tablet 3   aspirin EC 81 MG tablet Take 81 mg by mouth daily.     atorvastatin (LIPITOR) 80 MG tablet TAKE 1 TABLET(80 MG) BY MOUTH DAILY 90 tablet 0   carvedilol (COREG) 12.5 MG tablet TAKE 1 TABLET BY MOUTH TWICE A DAY WITH MEALS 180 tablet 0   Cholecalciferol (VITAMIN D-3) 125 MCG (5000 UT) TABS Take 5,000 Units by mouth daily.     fluticasone (FLONASE) 50 MCG/ACT nasal spray Place 2 sprays into both nostrils daily. (Patient taking differently: Place 2 sprays into both nostrils daily as needed.) 16 g 1   Galcanezumab-gnlm (EMGALITY) 120 MG/ML SOAJ Inject 120 mg into the skin every 30 (thirty) days. 1.12 mL 11   HYDROcodone-acetaminophen (NORCO) 5-325 MG tablet Take 1 tablet by mouth every 4 (four) hours as needed for severe pain (Renal colic). 20 tablet 0   icosapent Ethyl (VASCEPA) 1 g capsule TAKE TWO CAPSULES BY MOUTH TWICE A DAY 360 capsule 1   meloxicam (MOBIC) 15 MG tablet Take 1 tablet (15 mg total) by mouth daily as needed for pain. 90 tablet 1   methocarbamol (ROBAXIN) 500 MG tablet Take 1 tablet (500 mg total) by mouth every 6 (six) hours as needed for muscle spasms. 40 tablet 0   OVER THE COUNTER MEDICATION Take 1 capsule by mouth daily. Suprema Dophilus Supplement - probiotic     pantoprazole (PROTONIX) 40 MG tablet TAKE 1 TABLET BY MOUTH DAILY 30 tablet 11   repaglinide (PRANDIN) 1 MG tablet TAKE ONE TABLET BY MOUTH THREE TIMES A DAY BEFORE MEALS 270 tablet 3   SYRINGE-NEEDLE, DISP, 3 ML (B-D INTEGRA SYRINGE) 23G X 1" 3 ML MISC For IM injections 50 each 0   tadalafil (CIALIS) 5 MG tablet TAKE 1 TABLET BY MOUTH DAILY  90 tablet 1   tamsulosin (FLOMAX) 0.4 MG CAPS capsule Take 1 capsule (0.4 mg total) by mouth daily as needed. Renal colic 90 capsule 1   Ubrogepant (UBRELVY) 50 MG TABS Take 50mg  by mouth every 2 hours as needed for migraines   Maximum 100 mg daily 16 tablet 11   zolpidem (AMBIEN) 10 MG tablet TAKE 0.5-1 EVERY NIGHT AT BEDTIME AS NEEDED FOR SLEEP 30 tablet 5   cefdinir (OMNICEF) 300 MG capsule Take 1 capsule (300 mg total) by mouth 2 (two) times daily. 20 capsule 0   Semaglutide, 2 MG/DOSE, 8 MG/3ML SOPN Inject 2 mg as directed once a week. 3 mL 3   testosterone cypionate (DEPOTESTOSTERONE CYPIONATE) 200 MG/ML injection Inject 1 mL (200 mg total) into the muscle every 14 (fourteen) days. 10 mL 5   benzonatate (TESSALON) 100 MG capsule Take 1 capsule (100 mg total) by mouth 2 (two) times daily as needed for cough. (Patient not taking: Reported on 05/21/2023) 20 capsule 0   No facility-administered medications prior to visit.    ROS: Review of Systems  Constitutional:  Positive for unexpected weight change. Negative for appetite change and fatigue.  HENT:  Negative for congestion, nosebleeds, sneezing, sore throat and trouble swallowing.   Eyes:  Negative for itching and  visual disturbance.  Respiratory:  Negative for cough.   Cardiovascular:  Negative for chest pain, palpitations and leg swelling.  Gastrointestinal:  Negative for abdominal distention, blood in stool, diarrhea and nausea.  Genitourinary:  Negative for frequency and hematuria.  Musculoskeletal:  Negative for back pain, gait problem, joint swelling and neck pain.  Skin:  Negative for rash.  Neurological:  Negative for dizziness, tremors, speech difficulty and weakness.  Psychiatric/Behavioral:  Negative for agitation, dysphoric mood, sleep disturbance and suicidal ideas. The patient is not nervous/anxious.     Objective:  BP 120/70 (BP Location: Left Arm, Patient Position: Sitting, Cuff Size: Normal)   Pulse 81   Temp 98 F  (36.7 C) (Oral)   Ht 5\' 9"  (1.753 m)   Wt 192 lb (87.1 kg)   SpO2 97%   BMI 28.35 kg/m   BP Readings from Last 3 Encounters:  08/26/23 120/70  05/21/23 110/80  02/18/23 122/80    Wt Readings from Last 3 Encounters:  08/26/23 192 lb (87.1 kg)  05/21/23 197 lb (89.4 kg)  02/18/23 200 lb (90.7 kg)    Physical Exam Constitutional:      General: He is not in acute distress.    Appearance: Normal appearance. He is well-developed.     Comments: NAD  Eyes:     Conjunctiva/sclera: Conjunctivae normal.     Pupils: Pupils are equal, round, and reactive to light.  Neck:     Thyroid: No thyromegaly.     Vascular: No JVD.  Cardiovascular:     Rate and Rhythm: Normal rate and regular rhythm.     Heart sounds: Normal heart sounds. No murmur heard.    No friction rub. No gallop.  Pulmonary:     Effort: Pulmonary effort is normal. No respiratory distress.     Breath sounds: Normal breath sounds. No wheezing or rales.  Chest:     Chest wall: No tenderness.  Abdominal:     General: Bowel sounds are normal. There is no distension.     Palpations: Abdomen is soft. There is no mass.     Tenderness: There is no abdominal tenderness. There is no guarding or rebound.  Musculoskeletal:        General: No tenderness. Normal range of motion.     Cervical back: Normal range of motion.  Lymphadenopathy:     Cervical: No cervical adenopathy.  Skin:    General: Skin is warm and dry.     Findings: No rash.  Neurological:     Mental Status: He is alert and oriented to person, place, and time.     Cranial Nerves: No cranial nerve deficit.     Motor: No abnormal muscle tone.     Coordination: Coordination normal.     Gait: Gait normal.     Deep Tendon Reflexes: Reflexes are normal and symmetric.  Psychiatric:        Behavior: Behavior normal.        Thought Content: Thought content normal.        Judgment: Judgment normal.     Lab Results  Component Value Date   WBC 5.9 11/19/2022    HGB 14.3 11/19/2022   HCT 43.3 11/19/2022   PLT 140.0 (L) 11/19/2022   GLUCOSE 104 (H) 08/25/2023   CHOL 116 04/23/2022   TRIG 63.0 04/23/2022   HDL 40.60 04/23/2022   LDLDIRECT 184.1 10/20/2012   LDLCALC 63 04/23/2022   ALT 17 08/25/2023   AST 18 08/25/2023   NA  141 08/25/2023   K 4.0 08/25/2023   CL 106 08/25/2023   CREATININE 1.30 08/25/2023   BUN 15 08/25/2023   CO2 28 08/25/2023   TSH 0.72 11/19/2022   PSA 1.13 12/13/2020   INR 1.2 07/22/2019   HGBA1C 6.5 08/25/2023   MICROALBUR 1.3 08/25/2023    CT RENAL STONE STUDY  Result Date: 12/27/2021 CLINICAL DATA:  Left renal colic. EXAM: CT ABDOMEN AND PELVIS WITHOUT CONTRAST TECHNIQUE: Multidetector CT imaging of the abdomen and pelvis was performed following the standard protocol without IV contrast. RADIATION DOSE REDUCTION: This exam was performed according to the departmental dose-optimization program which includes automated exposure control, adjustment of the mA and/or kV according to patient size and/or use of iterative reconstruction technique. COMPARISON:  CT with IV contrast 06/12/2015 FINDINGS: Lower chest: There is left lower lobe linear scarring or atelectasis. There are no lung base infiltrates. The cardiac size is normal. Small chronic pericardial effusion is noted anteriorly and calcification in the distal LAD and right coronary arteries. Hepatobiliary: Several scattered hepatic cysts. Largest in the right lobe is 2.7 cm and 7.4 Hounsfield units. Largest in the left lobe is 2.3 cm in 7.3 Hounsfield units. Some were present previously and larger than previously and some are new. There are additional too small to characterize scattered hypodensities. Gallbladder and bile ducts are unremarkable. Pancreas: Unremarkable without contrast. Spleen: Unremarkable without contrast. Adrenals/Urinary Tract: No adrenal mass is seen. Small cyst again noted posteriorly in the upper pole left kidney. The renal cortex otherwise unremarkable  without contrast. There are several scattered punctate nonobstructive caliceal stones in the upper to midpole right kidney, and scattered punctate up to 1-2 mm stones in the left renal collecting system. No hydronephrosis or ureteral stones are seen although please note the distal right ureter below the level of the acetabulum is obscured by metal artifact from a right hip arthroplasty. Bladder wall is thickened which was seen previously, but is not fully distended. Stomach/Bowel: No dilatation or wall thickening including the appendix. Moderate fecal stasis. There are colonic diverticula without evidence of colitis or diverticulitis. Vascular/Lymphatic: There is moderate aortoiliac calcific plaque. There is no AAA but there is ectasia in the common iliac arteries, which measure 1.7 cm on the right and 1.4 cm on the left. Scattered calcific plaque in the branch arteries. Reproductive: Enlarged prostate, measuring 4.5 cm with bladder impression. Other: Small umbilical fat hernia. No incarcerated hernia. No free air, hemorrhage or fluid. Musculoskeletal: There are chronic L5 pars defects without L5-S1 spondylolisthesis. No acute or significant osseous findings. Right hip arthroplasty. Left hip DJD. IMPRESSION: 1. There is nonobstructive micronephrolithiasis. No hydronephrosis or ureteral stone is seen, but with the most distal right ureter obscured by his right hip arthroplasty. 2. Thickened bladder which could be due to nondistention, hypertrophy or cystitis. 3. Prostatomegaly. 4. Aortic and coronary artery atherosclerosis. 5. Hepatic cysts and additional too small to characterize hypodensities. 6. Constipation with diverticulosis. 7. Umbilical fat hernia. Electronically Signed   By: Almira Bar M.D.   On: 12/27/2021 03:20    Assessment & Plan:   Problem List Items Addressed This Visit     Diabetes mellitus type 2, controlled (HCC)   Relevant Medications   Semaglutide, 2 MG/DOSE, 8 MG/3ML SOPN   Other  Relevant Orders   Comprehensive metabolic panel   Hemoglobin A1c   THROMBOCYTOPENIA - Primary    Monitor CBC      Relevant Orders   Comprehensive metabolic panel   Testosterone  Hemoglobin A1c   CBC with Differential/Platelet   HTN (hypertension)    Chronic  Continue on amlodipine, Coreg      Relevant Orders   Comprehensive metabolic panel   Testosterone   Hemoglobin A1c   CBC with Differential/Platelet   Nausea    Hold Lipitor Oazempic is ok per pt Hold Meloxicam if not better       Relevant Orders   Comprehensive metabolic panel   Testosterone   Hemoglobin A1c   CBC with Differential/Platelet      Meds ordered this encounter  Medications   Semaglutide, 2 MG/DOSE, 8 MG/3ML SOPN    Sig: Inject 2 mg as directed once a week.    Dispense:  9 mL    Refill:  3   testosterone cypionate (DEPOTESTOSTERONE CYPIONATE) 200 MG/ML injection    Sig: Inject 1 mL (200 mg total) into the muscle every 14 (fourteen) days.    Dispense:  10 mL    Refill:  5      Follow-up: Return in about 3 months (around 11/26/2023) for a follow-up visit.  Sonda Primes, MD

## 2023-08-26 NOTE — Assessment & Plan Note (Signed)
Monitor CBC 

## 2023-08-26 NOTE — Assessment & Plan Note (Signed)
Chronic  Continue on amlodipine, Coreg

## 2023-08-26 NOTE — Assessment & Plan Note (Signed)
Hold Lipitor Jacob Gordon is ok per pt Hold Meloxicam if not better

## 2023-09-09 NOTE — Progress Notes (Unsigned)
Cardiology Office Note:  .   Date:  09/10/2023  ID:  Jarquis Walker, DOB 1956-07-01, MRN [id number] PCP: Tresa Garter, MD  [facility] Providers Cardiologist:  Donato Schultz, MD {    History of Present Illness: .   Jacob Gordon is a 67 y.o. male with a past medical history of coronary artery disease, hypertension, and hyperlipidemia here for follow-up appointment.  He had an appointment//23 regarding [facility] 12/24/2021 with right-sided chest pain.  Chest pain was sharp, stabbing, worse with movement and lifting his arms.  Normal troponin x 2, EKG unchanged from previous.  He was advised to follow-up outpatient.  As this follow-up noted discomfort in his right chest area near axilla that felt like "fullness".  He was not able to reproduce the pain, and it did not intensify with movement or exertion.  Encouraged him to reach out to PCP for further advisement regarding additional imaging.  Was also offered a trial of Imdur but he deferred due to severe BPH and cannot be off of his Cialis due to significant problems in the past.  It was discovered that he was taking carvedilol only once a day so he was instructed to resume twice daily.  Osteoarthritis of his right hip with abnormal FFR in the mid to distal LAD lesion.  His mother and several siblings all had MIs.  Strong family history.  Was seen July 2023 and at that time he was feeling well without any anginal symptoms.  On exam, felt on not in his subxiphoid region.  Epigastric tenderness to push.  No trouble with eating.  No change with food.  Also, he had recently stopped Lipitor.  Believes it may be because he was on Rybelsus.  Encouraged to continue statin therapy.  Checks x-ray ordered for bony prominence at subxiphoid region which was normal.  He was doing fine since episode of chest pain in February prompting his ED visit.  In retrospect, he believes may have been more musculoskeletal in  nature.  No recurrent chest pain.  For exercise he has been walking 4 times a week.  LDL 63, currently on atorvastatin 80 mg daily.  He denied palpitations, shortness of breath, peripheral edema, lightheadedness, headaches, syncope, family, PND.  Today, he presents  with a history of hyperlipidemia and hypertension  for a medication refill. He has not had any recent hospitalizations or ED visits since February of last year, when he had chest pain that was determined to be musculoskeletal in nature. He denies any recent chest pain or shortness of breath. He reports that his primary care provider monitors his cholesterol levels, but he is unsure if it was checked at his last visit. He has been adherent to his current medication regimen, which includes Lipitor for hyperlipidemia and amlodipine for hypertension, and has not experienced any side effects. He also takes Vascepa for his triglycerides. He reports that his blood pressure is well controlled, although it may be slightly elevated at this visit due to 'white coat syndrome.'  Reports no shortness of breath nor dyspnea on exertion. Reports no chest pain, pressure, or tightness. No edema, orthopnea, PND. Reports no palpitations.   Discussed the use of AI scribe software for clinical note transcription with the patient, who gave verbal consent to proceed.  ROS: Pertinent ROS in HPI  Studies Reviewed: Marland Kitchen   EKG Interpretation Date/Time:  Wednesday September 10 2023 11:37:52 EST Ventricular Rate:  75 PR Interval:  178  QRS Duration:  80 QT Interval:  360 QTC Calculation: 402 R Axis:   -23  Text Interpretation: Normal sinus rhythm Inferior infarct (cited on or before 24-Dec-2021) When compared with ECG of 24-Dec-2021 08:50, No significant change was found Confirmed by Jari Favre [phone]) on 09/10/2023 11:53:05 AM         Physical Exam:   VS:  BP 126/82   Pulse 75   Ht 5\' 9"  (1.753 m)   Wt 191 lb 3.2 oz (86.7 kg)   SpO2 96%   BMI 28.24 kg/m     Wt Readings from Last 3 Encounters:  09/10/23 191 lb 3.2 oz (86.7 kg)  08/26/23 192 lb (87.1 kg)  05/21/23 197 lb (89.4 kg)    GEN: Well nourished, well developed in no acute distress NECK: No JVD; No carotid bruits CARDIAC: RRR, no murmurs, rubs, gallops RESPIRATORY:  Clear to auscultation without rales, wheezing or rhonchi  ABDOMEN: Soft, non-tender, non-distended EXTREMITIES:  No edema; No deformity   ASSESSMENT AND PLAN: .    CAD -Continue regimen with amlodipine 10 mg daily, aspirin 81 mg daily, Lipitor 80 mg daily, carvedilol 12.5 mg with, Vascepa 2 g twice a day -no chest pain or SOB  Hyperlipidemia Stable on Lipitor. Last cholesterol panel was in summer of 2023 with good control (HDL 40, LDL 63, total cholesterol 604, triglycerides 63). No recent cholesterol panel available. -Continue Lipitor. -Recommend annual cholesterol panel with primary care provider.  Hypertension Stable on Amlodipine. Blood pressure well controlled at today's visit. -Continue Amlodipine.  General Health Maintenance / Followup Plans -Continue current regimen of physical activity. -Return visit in 1 year.   Dispo: He can follow-up in a year with Dr. Anne Fu  Signed, Sharlene Dory, PA-C

## 2023-09-10 ENCOUNTER — Encounter: Payer: Self-pay | Admitting: Physician Assistant

## 2023-09-10 ENCOUNTER — Ambulatory Visit: Payer: Medicare Other | Attending: Physician Assistant | Admitting: Physician Assistant

## 2023-09-10 VITALS — BP 126/82 | HR 75 | Ht 69.0 in | Wt 191.2 lb

## 2023-09-10 DIAGNOSIS — I10 Essential (primary) hypertension: Secondary | ICD-10-CM

## 2023-09-10 DIAGNOSIS — I2510 Atherosclerotic heart disease of native coronary artery without angina pectoris: Secondary | ICD-10-CM | POA: Diagnosis not present

## 2023-09-10 DIAGNOSIS — E785 Hyperlipidemia, unspecified: Secondary | ICD-10-CM | POA: Diagnosis not present

## 2023-09-10 MED ORDER — ATORVASTATIN CALCIUM 80 MG PO TABS: ORAL_TABLET | ORAL | 3 refills | Status: DC

## 2023-09-10 NOTE — Patient Instructions (Signed)
Medication Instructions:  Your physician recommends that you continue on your current medications as directed. Please refer to the Current Medication list given to you today.  *If you need a refill on your cardiac medications before your next appointment, please call your pharmacy*   Lab Work: None ordered  If you have labs (blood work) drawn today and your tests are completely normal, you will receive your results only by: MyChart Message (if you have MyChart) OR A paper copy in the mail If you have any lab test that is abnormal or we need to change your treatment, we will call you to review the results.   Testing/Procedures: None ordered   Follow-Up: At [facility], you and your health needs are our priority.  As part of our continuing mission to provide you with exceptional heart care, we have created designated Provider Care Teams.  These Care Teams include your primary Cardiologist (physician) and Advanced Practice Providers (APPs -  Physician Assistants and Nurse Practitioners) who all work together to provide you with the care you need, when you need it.  We recommend signing up for the patient portal called "MyChart".  Sign up information is provided on this After Visit Summary.  MyChart is used to connect with patients for Virtual Visits (Telemedicine).  Patients are able to view lab/test results, encounter notes, upcoming appointments, etc.  Non-urgent messages can be sent to your provider as well.   To learn more about what you can do with MyChart, go to [email or web address].    Your next appointment:   12 month(s)  Provider:   Donato Schultz, MD     Other Instructions

## 2023-09-16 DIAGNOSIS — K080 Exfoliation of teeth due to systemic causes: Secondary | ICD-10-CM | POA: Diagnosis not present

## 2023-09-17 ENCOUNTER — Other Ambulatory Visit: Payer: Self-pay | Admitting: Cardiology

## 2023-09-17 MED ORDER — CARVEDILOL 12.5 MG PO TABS: 12.5000 mg | ORAL_TABLET | Freq: Two times a day (BID) | ORAL | 3 refills | Status: DC

## 2023-10-01 ENCOUNTER — Encounter: Payer: Self-pay | Admitting: Internal Medicine

## 2023-10-08 ENCOUNTER — Other Ambulatory Visit: Payer: Self-pay | Admitting: Internal Medicine

## 2023-10-08 MED ORDER — BD SYRINGE/NEEDLE 22G X 1-1/2" 3 ML MISC: 1 refills | Status: DC

## 2023-10-14 DIAGNOSIS — K080 Exfoliation of teeth due to systemic causes: Secondary | ICD-10-CM | POA: Diagnosis not present

## 2023-10-16 DIAGNOSIS — A35 Other tetanus: Secondary | ICD-10-CM | POA: Insufficient documentation

## 2023-11-01 ENCOUNTER — Other Ambulatory Visit: Payer: Self-pay | Admitting: Physician Assistant

## 2023-11-04 DIAGNOSIS — R6884 Jaw pain: Secondary | ICD-10-CM | POA: Diagnosis not present

## 2023-11-04 DIAGNOSIS — M542 Cervicalgia: Secondary | ICD-10-CM | POA: Diagnosis not present

## 2023-11-11 DIAGNOSIS — R6884 Jaw pain: Secondary | ICD-10-CM | POA: Diagnosis not present

## 2023-11-11 DIAGNOSIS — M542 Cervicalgia: Secondary | ICD-10-CM | POA: Diagnosis not present

## 2023-11-13 DIAGNOSIS — R6884 Jaw pain: Secondary | ICD-10-CM | POA: Diagnosis not present

## 2023-11-13 DIAGNOSIS — M542 Cervicalgia: Secondary | ICD-10-CM | POA: Diagnosis not present

## 2023-11-18 DIAGNOSIS — M542 Cervicalgia: Secondary | ICD-10-CM | POA: Diagnosis not present

## 2023-11-18 DIAGNOSIS — R6884 Jaw pain: Secondary | ICD-10-CM | POA: Diagnosis not present

## 2023-11-19 ENCOUNTER — Other Ambulatory Visit (INDEPENDENT_AMBULATORY_CARE_PROVIDER_SITE_OTHER): Payer: Medicare Other

## 2023-11-19 DIAGNOSIS — I10 Essential (primary) hypertension: Secondary | ICD-10-CM

## 2023-11-19 DIAGNOSIS — R110 Nausea: Secondary | ICD-10-CM

## 2023-11-19 DIAGNOSIS — E1121 Type 2 diabetes mellitus with diabetic nephropathy: Secondary | ICD-10-CM | POA: Diagnosis not present

## 2023-11-19 DIAGNOSIS — D696 Thrombocytopenia, unspecified: Secondary | ICD-10-CM

## 2023-11-19 LAB — CBC WITH DIFFERENTIAL/PLATELET
Basophils Absolute: 0 10*3/uL (ref 0.0–0.1)
Basophils Relative: 0.3 % (ref 0.0–3.0)
Eosinophils Absolute: 0.1 10*3/uL (ref 0.0–0.7)
Eosinophils Relative: 0.7 % (ref 0.0–5.0)
HCT: 45.5 % (ref 39.0–52.0)
Hemoglobin: 14.4 g/dL (ref 13.0–17.0)
Lymphocytes Relative: 17.1 % (ref 12.0–46.0)
Lymphs Abs: 1.5 10*3/uL (ref 0.7–4.0)
MCHC: 31.6 g/dL (ref 30.0–36.0)
MCV: 82.2 fL (ref 78.0–100.0)
Monocytes Absolute: 0.7 10*3/uL (ref 0.1–1.0)
Monocytes Relative: 7.8 % (ref 3.0–12.0)
Neutro Abs: 6.3 10*3/uL (ref 1.4–7.7)
Neutrophils Relative %: 74.1 % (ref 43.0–77.0)
Platelets: 153 10*3/uL (ref 150.0–400.0)
RBC: 5.54 Mil/uL (ref 4.22–5.81)
RDW: 21.8 % — ABNORMAL HIGH (ref 11.5–15.5)
WBC: 8.5 10*3/uL (ref 4.0–10.5)

## 2023-11-19 LAB — COMPREHENSIVE METABOLIC PANEL
ALT: 29 U/L (ref 0–53)
AST: 20 U/L (ref 0–37)
Albumin: 4.2 g/dL (ref 3.5–5.2)
Alkaline Phosphatase: 66 U/L (ref 39–117)
BUN: 13 mg/dL (ref 6–23)
CO2: 31 meq/L (ref 19–32)
Calcium: 9.2 mg/dL (ref 8.4–10.5)
Chloride: 105 meq/L (ref 96–112)
Creatinine, Ser: 1.24 mg/dL (ref 0.40–1.50)
GFR: 60.36 mL/min (ref >60.00)
Glucose, Bld: 130 mg/dL — ABNORMAL HIGH (ref 70–99)
Potassium: 4 meq/L (ref 3.5–5.1)
Sodium: 141 meq/L (ref 135–145)
Total Bilirubin: 1 mg/dL (ref 0.2–1.2)
Total Protein: 6.9 g/dL (ref 6.0–8.3)

## 2023-11-19 LAB — TESTOSTERONE: Testosterone: 995.11 ng/dL — ABNORMAL HIGH (ref 300.00–890.00)

## 2023-11-19 LAB — HEMOGLOBIN A1C: Hgb A1c MFr Bld: 6.6 % — ABNORMAL HIGH (ref 4.6–6.5)

## 2023-11-24 DIAGNOSIS — M6248 Contracture of muscle, other site: Secondary | ICD-10-CM | POA: Diagnosis not present

## 2023-11-26 ENCOUNTER — Ambulatory Visit (INDEPENDENT_AMBULATORY_CARE_PROVIDER_SITE_OTHER): Payer: Medicare Other | Admitting: Internal Medicine

## 2023-11-26 ENCOUNTER — Encounter: Payer: Self-pay | Admitting: Internal Medicine

## 2023-11-26 VITALS — BP 110/70 | HR 87 | Temp 98.5°F | Ht 69.0 in | Wt 190.0 lb

## 2023-11-26 DIAGNOSIS — M26621 Arthralgia of right temporomandibular joint: Secondary | ICD-10-CM

## 2023-11-26 DIAGNOSIS — E7800 Pure hypercholesterolemia, unspecified: Secondary | ICD-10-CM

## 2023-11-26 DIAGNOSIS — G44229 Chronic tension-type headache, not intractable: Secondary | ICD-10-CM

## 2023-11-26 DIAGNOSIS — E1121 Type 2 diabetes mellitus with diabetic nephropathy: Secondary | ICD-10-CM

## 2023-11-26 DIAGNOSIS — G4709 Other insomnia: Secondary | ICD-10-CM

## 2023-11-26 DIAGNOSIS — Z7985 Long-term (current) use of injectable non-insulin antidiabetic drugs: Secondary | ICD-10-CM

## 2023-11-26 MED ORDER — HYDROCODONE-ACETAMINOPHEN 5-325 MG PO TABS: 1.0000 | ORAL_TABLET | Freq: Four times a day (QID) | ORAL | 0 refills | Status: AC | PRN

## 2023-11-26 MED ORDER — DIAZEPAM 5 MG PO TABS: 2.5000 mg | ORAL_TABLET | Freq: Two times a day (BID) | ORAL | 1 refills | Status: AC | PRN

## 2023-11-26 NOTE — Assessment & Plan Note (Addendum)
R side Try a chin strap Hold Lipitor x 1 month Norco prn, Diazepam prn (do not take together or w/Zolpidem)  Potential benefits of a short term opioid/benzo use as well as potential risks (i.e. addiction risk, apnea etc) and complications (i.e. Somnolence, constipation and others) were explained to the patient and were aknowledged.  Blue-Emu cream was recommended to use 2-3 times a day

## 2023-11-26 NOTE — Patient Instructions (Addendum)
Try a chin strap  Hold Lipitor x 1 month   USEFUL THINGS FOR ARTHRITIS and musculoskeletal pains:    A "rice sock heating pad" refers to a homemade heating pad created by filling a sock with uncooked rice, which can be heated in a microwave to provide a warm compress for sore muscles, pain relief, or other applications; essentially, it's a simple way to generate heat using readily available materials.  Key points about rice sock heat: How to make it: Fill a clean sock (preferably a tube sock) about 2/3 full with uncooked rice, tie a knot at the top to secure the rice inside.  Heating it up: Place the rice sock in the microwave and heat in short intervals (usually around 30 seconds at a time) until it reaches the desired warmth.  Important considerations: Check temperature before applying: Always test the temperature of the rice sock before applying it to your skin to avoid burns.  Use a towel to protect skin: Wrap the rice sock in a thin towel to distribute the heat evenly and protect your skin.  Uses: Muscle aches and pains  Menstrual cramps  Neck pain  Arthritis discomfort   SILICONE PADS: Use them to open jars and bottles    BRIX JAR OPENER: Use them to open jars    NITRILE COATED GARDEN GLOVES: Use down to open jars, lift boxes, etc.   THUMB BRACE: Use it for thumb arthritis flareup pain     BLUE EMU CREAM: Use it 2-3 times a day on painful areas

## 2023-11-26 NOTE — Progress Notes (Signed)
Subjective:  Patient ID: Jacob Gordon, male    DOB: Sep 27, 1956  Age: 68 y.o. MRN: [id number]  CC: Medical Management of Chronic Issues ( f/u/)   HPI Jacob Gordon presents for a locked jaw 2 days after a root canal done on 10/14/23. Prednisone and flexeril did not help. C/o pain R TMJ/R HA  Outpatient Medications Prior to Visit  Medication Sig Dispense Refill   amLODipine (NORVASC) 10 MG tablet TAKE 1 TABLET BY MOUTH EVERY DAY. 90 tablet 3   aspirin EC 81 MG tablet Take 81 mg by mouth daily.     atorvastatin (LIPITOR) 80 MG tablet TAKE 1 TABLET BY MOUTH DAILY 90 tablet 3   carvedilol (COREG) 12.5 MG tablet Take 1 tablet (12.5 mg total) by mouth 2 (two) times daily with a meal. 180 tablet 3   Cholecalciferol (VITAMIN D-3) 125 MCG (5000 UT) TABS Take 5,000 Units by mouth daily.     fluticasone (FLONASE) 50 MCG/ACT nasal spray Place 2 sprays into both nostrils daily. (Patient taking differently: Place 2 sprays into both nostrils daily as needed.) 16 g 1   Galcanezumab-gnlm (EMGALITY) 120 MG/ML SOAJ Inject 120 mg into the skin every 30 (thirty) days. 1.12 mL 11   icosapent Ethyl (VASCEPA) 1 g capsule TAKE TWO CAPSULES BY MOUTH TWICE A DAY 360 capsule 1   OVER THE COUNTER MEDICATION Take 1 capsule by mouth daily. Suprema Dophilus Supplement - probiotic     pantoprazole (PROTONIX) 40 MG tablet TAKE 1 TABLET BY MOUTH DAILY 30 tablet 11   repaglinide (PRANDIN) 1 MG tablet TAKE ONE TABLET BY MOUTH THREE TIMES A DAY BEFORE MEALS 270 tablet 3   Semaglutide, 2 MG/DOSE, 8 MG/3ML SOPN Inject 2 mg as directed once a week. 9 mL 3   SYRINGE-NEEDLE, DISP, 3 ML (B-D SYRINGE/NEEDLE 3CC/22GX1.5) 22G X 1-1/2" 3 ML MISC Use as directed IM 50 each 1   tadalafil (CIALIS) 5 MG tablet TAKE 1 TABLET BY MOUTH DAILY 90 tablet 1   tamsulosin (FLOMAX) 0.4 MG CAPS capsule Take 1 capsule (0.4 mg total) by mouth daily as needed. Renal colic 90 capsule 1   testosterone cypionate (DEPOTESTOSTERONE  CYPIONATE) 200 MG/ML injection Inject 1 mL (200 mg total) into the muscle every 14 (fourteen) days. 10 mL 5   Ubrogepant (UBRELVY) 50 MG TABS Take 50mg  by mouth every 2 hours as needed for migraines   Maximum 100 mg daily 16 tablet 11   zolpidem (AMBIEN) 10 MG tablet TAKE 0.5-1 EVERY NIGHT AT BEDTIME AS NEEDED FOR SLEEP 30 tablet 5   HYDROcodone-acetaminophen (NORCO) 5-325 MG tablet Take 1 tablet by mouth every 4 (four) hours as needed for severe pain (Renal colic). 20 tablet 0   meloxicam (MOBIC) 15 MG tablet Take 1 tablet (15 mg total) by mouth daily as needed for pain. 90 tablet 1   methocarbamol (ROBAXIN) 500 MG tablet Take 1 tablet (500 mg total) by mouth every 6 (six) hours as needed for muscle spasms. 40 tablet 0   No facility-administered medications prior to visit.    ROS: Review of Systems  Constitutional:  Negative for appetite change, fatigue and unexpected weight change.  HENT:  Negative for congestion, nosebleeds, sneezing, sore throat and trouble swallowing.   Eyes:  Negative for itching and visual disturbance.  Respiratory:  Negative for cough.   Cardiovascular:  Negative for chest pain, palpitations and leg swelling.  Gastrointestinal:  Negative for abdominal distention, blood in stool, diarrhea and nausea.  Genitourinary:  Negative for frequency and hematuria.  Musculoskeletal:  Positive for arthralgias. Negative for back pain, gait problem, joint swelling and neck pain.  Skin:  Negative for rash.  Neurological:  Negative for dizziness, tremors, speech difficulty and weakness.  Psychiatric/Behavioral:  Negative for agitation, dysphoric mood and sleep disturbance. The patient is not nervous/anxious.     Objective:  BP 110/70 (BP Location: Left Arm, Patient Position: Sitting, Cuff Size: Normal)   Pulse 87   Temp 98.5 F (36.9 C) (Oral)   Ht 5\' 9"  (1.753 m)   Wt 190 lb (86.2 kg)   SpO2 99%   BMI 28.06 kg/m   BP Readings from Last 3 Encounters:  11/26/23 110/70   09/10/23 126/82  08/26/23 120/70    Wt Readings from Last 3 Encounters:  11/26/23 190 lb (86.2 kg)  09/10/23 191 lb 3.2 oz (86.7 kg)  08/26/23 192 lb (87.1 kg)    Physical Exam Constitutional:      General: He is not in acute distress.    Appearance: Normal appearance. He is well-developed.     Comments: NAD  Eyes:     Conjunctiva/sclera: Conjunctivae normal.     Pupils: Pupils are equal, round, and reactive to light.  Neck:     Thyroid: No thyromegaly.     Vascular: No JVD.  Cardiovascular:     Rate and Rhythm: Normal rate and regular rhythm.     Heart sounds: Normal heart sounds. No murmur heard.    No friction rub. No gallop.  Pulmonary:     Effort: Pulmonary effort is normal. No respiratory distress.     Breath sounds: Normal breath sounds. No wheezing or rales.  Chest:     Chest wall: No tenderness.  Abdominal:     General: Bowel sounds are normal. There is no distension.     Palpations: Abdomen is soft. There is no mass.     Tenderness: There is no abdominal tenderness. There is no guarding or rebound.  Musculoskeletal:        General: No tenderness. Normal range of motion.     Cervical back: Normal range of motion.  Lymphadenopathy:     Cervical: No cervical adenopathy.  Skin:    General: Skin is warm and dry.     Findings: No rash.  Neurological:     Mental Status: He is alert and oriented to person, place, and time.     Cranial Nerves: No cranial nerve deficit.     Motor: No abnormal muscle tone.     Coordination: Coordination normal.     Gait: Gait normal.     Deep Tendon Reflexes: Reflexes are normal and symmetric.  Psychiatric:        Behavior: Behavior normal.        Thought Content: Thought content normal.        Judgment: Judgment normal.   R TMJ, R masseter - painful  Lab Results  Component Value Date   WBC 8.5 11/19/2023   HGB 14.4 11/19/2023   HCT 45.5 11/19/2023   PLT 153.0 11/19/2023   GLUCOSE 130 (H) 11/19/2023   CHOL 116  04/23/2022   TRIG 63.0 04/23/2022   HDL 40.60 04/23/2022   LDLDIRECT 184.1 10/20/2012   LDLCALC 63 04/23/2022   ALT 29 11/19/2023   AST 20 11/19/2023   NA 141 11/19/2023   K 4.0 11/19/2023   CL 105 11/19/2023   CREATININE 1.24 11/19/2023   BUN 13 11/19/2023   CO2 31 11/19/2023  TSH 0.72 11/19/2022   PSA 1.13 12/13/2020   INR 1.2 07/22/2019   HGBA1C 6.6 (H) 11/19/2023   MICROALBUR 1.3 08/25/2023    CT RENAL STONE STUDY Result Date: 12/27/2021 CLINICAL DATA:  Left renal colic. EXAM: CT ABDOMEN AND PELVIS WITHOUT CONTRAST TECHNIQUE: Multidetector CT imaging of the abdomen and pelvis was performed following the standard protocol without IV contrast. RADIATION DOSE REDUCTION: This exam was performed according to the departmental dose-optimization program which includes automated exposure control, adjustment of the mA and/or kV according to patient size and/or use of iterative reconstruction technique. COMPARISON:  CT with IV contrast 06/12/2015 FINDINGS: Lower chest: There is left lower lobe linear scarring or atelectasis. There are no lung base infiltrates. The cardiac size is normal. Small chronic pericardial effusion is noted anteriorly and calcification in the distal LAD and right coronary arteries. Hepatobiliary: Several scattered hepatic cysts. Largest in the right lobe is 2.7 cm and 7.4 Hounsfield units. Largest in the left lobe is 2.3 cm in 7.3 Hounsfield units. Some were present previously and larger than previously and some are new. There are additional too small to characterize scattered hypodensities. Gallbladder and bile ducts are unremarkable. Pancreas: Unremarkable without contrast. Spleen: Unremarkable without contrast. Adrenals/Urinary Tract: No adrenal mass is seen. Small cyst again noted posteriorly in the upper pole left kidney. The renal cortex otherwise unremarkable without contrast. There are several scattered punctate nonobstructive caliceal stones in the upper to midpole  right kidney, and scattered punctate up to 1-2 mm stones in the left renal collecting system. No hydronephrosis or ureteral stones are seen although please note the distal right ureter below the level of the acetabulum is obscured by metal artifact from a right hip arthroplasty. Bladder wall is thickened which was seen previously, but is not fully distended. Stomach/Bowel: No dilatation or wall thickening including the appendix. Moderate fecal stasis. There are colonic diverticula without evidence of colitis or diverticulitis. Vascular/Lymphatic: There is moderate aortoiliac calcific plaque. There is no AAA but there is ectasia in the common iliac arteries, which measure 1.7 cm on the right and 1.4 cm on the left. Scattered calcific plaque in the branch arteries. Reproductive: Enlarged prostate, measuring 4.5 cm with bladder impression. Other: Small umbilical fat hernia. No incarcerated hernia. No free air, hemorrhage or fluid. Musculoskeletal: There are chronic L5 pars defects without L5-S1 spondylolisthesis. No acute or significant osseous findings. Right hip arthroplasty. Left hip DJD. IMPRESSION: 1. There is nonobstructive micronephrolithiasis. No hydronephrosis or ureteral stone is seen, but with the most distal right ureter obscured by his right hip arthroplasty. 2. Thickened bladder which could be due to nondistention, hypertrophy or cystitis. 3. Prostatomegaly. 4. Aortic and coronary artery atherosclerosis. 5. Hepatic cysts and additional too small to characterize hypodensities. 6. Constipation with diverticulosis. 7. Umbilical fat hernia. Electronically Signed   By: Almira Bar M.D.   On: 12/27/2021 03:20    Assessment & Plan:   Problem List Items Addressed This Visit     Diabetes mellitus type 2, controlled (HCC)    On Ozempic  2 mg/wk A1c q 3 mo      HLD (hyperlipidemia)   Hold Atorvastatin x 2-4 wks due to muscle pain       TMJ arthralgia - Primary   R side Try a chin strap Hold  Lipitor x 1 month Norco prn, Diazepam prn (do not take together or w/Zolpidem)  Potential benefits of a short term opioid/benzo use as well as potential risks (i.e. addiction risk, apnea etc)  and complications (i.e. Somnolence, constipation and others) were explained to the patient and were aknowledged.  Blue-Emu cream was recommended to use 2-3 times a day       Headache   R side TMJ Try a chin strap Hold Lipitor x 1 month Norco prn, Diazepam prn (do not take together or w/Zolpidem)  Potential benefits of a short term opioid/benzo use as well as potential risks (i.e. addiction risk, apnea etc) and complications (i.e. Somnolence, constipation and others) were explained to the patient and were aknowledged.  Blue-Emu cream was recommended to use 2-3 times a day      Relevant Medications   HYDROcodone-acetaminophen (NORCO) 5-325 MG tablet   Insomnia   Start Zolpidem prn  Potential benefits of a long term benzodiazepines  use as well as potential risks  and complications were explained to the patient and were aknowledged.         Meds ordered this encounter  Medications   HYDROcodone-acetaminophen (NORCO) 5-325 MG tablet    Sig: Take 1 tablet by mouth every 6 (six) hours as needed for severe pain (pain score 7-10) (Renal colic).    Dispense:  20 tablet    Refill:  0   diazepam (VALIUM) 5 MG tablet    Sig: Take 0.5-1 tablets (2.5-5 mg total) by mouth 2 (two) times daily as needed for anxiety.    Dispense:  40 tablet    Refill:  1      Follow-up: Return in about 4 weeks (around 12/24/2023) for a follow-up visit.  Sonda Primes, MD

## 2023-11-26 NOTE — Assessment & Plan Note (Signed)
Hold Atorvastatin x 2-4 wks due to muscle pain

## 2023-11-26 NOTE — Assessment & Plan Note (Signed)
Start Zolpidem prn  Potential benefits of a long term benzodiazepines  use as well as potential risks  and complications were explained to the patient and were aknowledged.

## 2023-11-26 NOTE — Assessment & Plan Note (Signed)
R side TMJ Try a chin strap Hold Lipitor x 1 month Norco prn, Diazepam prn (do not take together or w/Zolpidem)  Potential benefits of a short term opioid/benzo use as well as potential risks (i.e. addiction risk, apnea etc) and complications (i.e. Somnolence, constipation and others) were explained to the patient and were aknowledged.  Blue-Emu cream was recommended to use 2-3 times a day

## 2023-11-26 NOTE — Assessment & Plan Note (Signed)
  On Ozempic  2 mg/wk A1c q 3 mo

## 2023-12-09 NOTE — Progress Notes (Signed)
 This encounter was created in error - please disregard.

## 2023-12-10 ENCOUNTER — Ambulatory Visit: Payer: Medicare Other

## 2023-12-10 VITALS — Ht 69.0 in | Wt 190.0 lb

## 2023-12-10 DIAGNOSIS — Z0000 Encounter for general adult medical examination without abnormal findings: Secondary | ICD-10-CM

## 2023-12-10 NOTE — Patient Instructions (Addendum)
 Jacob Gordon , Thank you for taking time to come for your Medicare Wellness Visit. I appreciate your ongoing commitment to your health goals. Please review the following plan we discussed and let me know if I can assist you in the future.   Referrals/Orders/Follow-Ups/Clinician Recommendations: It was nice talking to you today.  Remember that you are due for a Foot exam during your next office visit.  Also to ask [facility] to sent your [facility] exam to your PCP's office.  Aim for 30 minutes of exercise or brisk walking, 6-8 glasses of water , and 5 servings of fruits and vegetables each day.   This is a list of the screening recommended for you and due dates:  Health Maintenance  Topic Date Due   Complete foot exam   03/19/2014   Eye exam for diabetics  07/10/2023   COVID-19 Vaccine (6 - 2024-25 season) 09/25/2023   Hemoglobin A1C  05/18/2024   Yearly kidney health urinalysis for diabetes  08/24/2024   Yearly kidney function blood test for diabetes  11/18/2024   Medicare Annual Wellness Visit  12/09/2024   DTaP/Tdap/Td vaccine (7 - Td or Tdap) 08/03/2025   Colon Cancer Screening  01/08/2028   Pneumonia Vaccine  Completed   Flu Shot  Completed   Hepatitis C Screening  Completed   Zoster (Shingles) Vaccine  Completed   HPV Vaccine  Aged Out    Advanced directives: (Copy Requested) Please bring a copy of your health care power of attorney and living will to the office to be added to your chart at your convenience.  Next Medicare Annual Wellness Visit scheduled for next year: Yes

## 2023-12-10 NOTE — Progress Notes (Addendum)
 Subjective:   Jacob Gordon is a 68 y.o. male who presents for Medicare Annual/Subsequent preventive examination.  Visit Complete: Virtual I connected with  Jerre [name] Ruth on 12/10/23 by a video and audio enabled telemedicine application and verified that I am speaking with the correct person using two identifiers.  Patient Location: Home  Provider Location: Office/Clinic  I discussed the limitations of evaluation and management by telemedicine. The patient expressed understanding and agreed to proceed.  Vital Signs: Because this visit was a virtual/telehealth visit, some criteria may be missing or patient reported. Any vitals not documented were not able to be obtained and vitals that have been documented are patient reported.    Cardiac Risk Factors include: advanced age (>86men, >51 women);male gender;hypertension;Other (see comment);dyslipidemia, Risk factor comments: CAD     Objective:    Today's Vitals   12/10/23 1303 12/10/23 1305  Weight: 190 lb (86.2 kg)   Height: 5' 9 (1.753 m)   PainSc:  3    Body mass index is 28.06 kg/m.     12/10/2023    1:13 PM 08/20/2022    2:10 PM 12/24/2021    9:01 AM 07/28/2019    4:27 PM 07/22/2019   11:52 AM 01/07/2018    7:15 AM  Advanced Directives  Does Patient Have a Medical Advance Directive? Yes Yes No Yes Yes Yes  Type of Estate Agent of [place];Living will Healthcare Power of [place];Living will  Healthcare Power of [place];Living will Healthcare Power of [place];Living will Healthcare Power of [place];Living will  Does patient want to make changes to medical advance directive?    No - Patient declined No - Patient declined No - Patient declined  Copy of Healthcare Power of Attorney in Chart? No - copy requested No - copy requested  No - copy requested No - copy requested No - copy requested  Would patient like information on creating a medical advance directive?   No - Patient declined        Current Medications (verified) Outpatient Encounter Medications as of 12/10/2023  Medication Sig   amLODipine  (NORVASC ) 10 MG tablet TAKE 1 TABLET BY MOUTH EVERY DAY.   aspirin  EC 81 MG tablet Take 81 mg by mouth daily.   atorvastatin  (LIPITOR) 80 MG tablet TAKE 1 TABLET BY MOUTH DAILY   carvedilol  (COREG ) 12.5 MG tablet Take 1 tablet (12.5 mg total) by mouth 2 (two) times daily with a meal.   Cholecalciferol (VITAMIN D -3) 125 MCG (5000 UT) TABS Take 5,000 Units by mouth daily.   diazepam  (VALIUM ) 5 MG tablet Take 0.5-1 tablets (2.5-5 mg total) by mouth 2 (two) times daily as needed for anxiety.   fluticasone  (FLONASE ) 50 MCG/ACT nasal spray Place 2 sprays into both nostrils daily. (Patient taking differently: Place 2 sprays into both nostrils daily as needed.)   Galcanezumab -gnlm (EMGALITY ) 120 MG/ML SOAJ Inject 120 mg into the skin every 30 (thirty) days.   icosapent  Ethyl (VASCEPA ) 1 g capsule TAKE TWO CAPSULES BY MOUTH TWICE A DAY   metaxalone (SKELAXIN) 800 MG tablet Take 800 mg by mouth 3 (three) times daily.   OVER THE COUNTER MEDICATION Take 1 capsule by mouth daily. Suprema Dophilus Supplement - probiotic   pantoprazole  (PROTONIX ) 40 MG tablet TAKE 1 TABLET BY MOUTH DAILY   repaglinide  (PRANDIN ) 1 MG tablet TAKE ONE TABLET BY MOUTH THREE TIMES A DAY BEFORE MEALS   Semaglutide , 2 MG/DOSE, 8 MG/3ML SOPN Inject 2 mg as directed once a week.  SYRINGE-NEEDLE, DISP, 3 ML (B-D SYRINGE/NEEDLE 3CC/22GX1.5) 22G X 1-1/2 3 ML MISC Use as directed IM   tadalafil  (CIALIS ) 5 MG tablet TAKE 1 TABLET BY MOUTH DAILY   tamsulosin  (FLOMAX ) 0.4 MG CAPS capsule Take 1 capsule (0.4 mg total) by mouth daily as needed. Renal colic   testosterone  cypionate (DEPOTESTOSTERONE CYPIONATE) 200 MG/ML injection Inject 1 mL (200 mg total) into the muscle every 14 (fourteen) days.   Ubrogepant  (UBRELVY ) 50 MG TABS Take 50mg  by mouth every 2 hours as needed for migraines   Maximum 100 mg daily   zolpidem  (AMBIEN )  10 MG tablet TAKE 0.5-1 EVERY NIGHT AT BEDTIME AS NEEDED FOR SLEEP   HYDROcodone -acetaminophen  (NORCO) 5-325 MG tablet Take 1 tablet by mouth every 6 (six) hours as needed for severe pain (pain score 7-10) (Renal colic). (Patient not taking: Reported on 12/10/2023)   No facility-administered encounter medications on file as of 12/10/2023.    Allergies (verified) Tape, Amlodipine , Metformin  and related, Sulfa antibiotics, Valsartan, and Pravastatin sodium   History: Past Medical History:  Diagnosis Date   ALLERGIC RHINITIS    Chronic headaches    migraines   Complication of anesthesia    violent vomiting    Coronary artery disease    non obstructive   DEGENERATIVE DISC DISEASE    DIABETES MELLITUS, TYPE II    type 2   GERD    History of kidney stones    HLD (hyperlipidemia)    HYPERTENSION    Internal hemorrhoids    OSTEOARTHRITIS    PONV (postoperative nausea and vomiting)    Sleep apnea    no cpap mild case   THROMBOCYTOPENIA    Past Surgical History:  Procedure Laterality Date   COLONOSCOPY  10/01/11   internal hemorrhoids   LEFT HEART CATH AND CORONARY ANGIOGRAPHY N/A 01/07/2018   Procedure: LEFT HEART CATH AND CORONARY ANGIOGRAPHY;  Surgeon: Verlin Lonni BIRCH, MD;  Location: [facility];  Service: Cardiovascular;  Laterality: N/A;   SHOULDER SURGERY     right rotator cuff surg   TONSILLECTOMY AND ADENOIDECTOMY     TOTAL HIP ARTHROPLASTY Right 07/28/2019   Procedure: TOTAL HIP ARTHROPLASTY ANTERIOR APPROACH;  Surgeon: Melodi Lerner, MD;  Location: [facility];  Service: Orthopedics;  Laterality: Right;    VASECTOMY     Family History  Problem Relation Age of Onset   Diabetes Sister    Colon cancer Neg Hx    Social History   Socioeconomic History   Marital status: Married    Spouse name: Rebacca   Number of children: 1   Years of education: Not on file   Highest education level: Some college, no degree  Occupational History   Occupation:  Designer, Industrial/product /Part time  Tobacco Use   Smoking status: Never   Smokeless tobacco: Never  Vaping Use   Vaping status: Never Used  Substance and Sexual Activity   Alcohol use: No   Drug use: No   Sexual activity: Yes  Other Topics Concern   Not on file  Social History Narrative   Caffeine daily       Lives with wife   Social Drivers of Health   Financial Resource Strain: Low Risk  (12/10/2023)   Overall Financial Resource Strain (CARDIA)    Difficulty of Paying Living Expenses: Not hard at all  Food Insecurity: No Food Insecurity (12/10/2023)   Hunger Vital Sign    Worried About Running Out of Food in the Last Year: Never true  Ran Out of Food in the Last Year: Never true  Transportation Needs: No Transportation Needs (12/10/2023)   PRAPARE - Administrator, Civil Service (Medical): No    Lack of Transportation (Non-Medical): No  Physical Activity: Inactive (12/10/2023)   Exercise Vital Sign    Days of Exercise per Week: 0 days    Minutes of Exercise per Session: 0 min  Stress: Stress Concern Present (12/10/2023)   [employer] of Occupational Health - Occupational Stress Questionnaire    Feeling of Stress : Rather much  Social Connections: Moderately Isolated (12/10/2023)   Social Connection and Isolation Panel [NHANES]    Frequency of Communication with Friends and Family: More than three times a week    Frequency of Social Gatherings with Friends and Family: Never    Attends Religious Services: Never    Database Administrator or Organizations: No    Attends Engineer, Structural: Never    Marital Status: Married    Tobacco Counseling Counseling given: Not Answered   Clinical Intake:  Pre-visit preparation completed: Yes  Pain : 0-10 Pain Score: 3  Pain Type:  (jaw pain) Pain Onset: More than a month ago Pain Frequency: Constant     BMI - recorded: 28.06 Nutritional Status: BMI 25 -29 Overweight Nutritional Risks: None Diabetes:  Yes CBG done?: No Did pt. bring in CBG monitor from home?: No  How often do you need to have someone help you when you read instructions, pamphlets, or other written materials from your doctor or pharmacy?: 1 - Never  Interpreter Needed?: No  Information entered by :: Marea Reasner, RMA   Activities of Daily Living    12/10/2023    1:07 PM  In your present state of health, do you have any difficulty performing the following activities:  Hearing? 0  Vision? 0  Difficulty concentrating or making decisions? 0  Walking or climbing stairs? 0  Dressing or bathing? 0  Doing errands, shopping? 0  Preparing Food and eating ? N  Using the Toilet? N  In the past six months, have you accidently leaked urine? N  Do you have problems with loss of bowel control? N  Managing your Medications? N  Managing your Finances? N  Housekeeping or managing your Housekeeping? N    Patient Care Team: Plotnikov, Karlynn GAILS, MD as PCP - General Jeffrie Oneil BROCKS, MD as PCP - Cardiology (Cardiology) Melodi Lerner, MD as Consulting Physician (Orthopedic Surgery) Ines Onetha NOVAK, MD as Consulting Physician (Neurology)  Indicate any recent Medical Services you may have received from other than [facility] providers in the past year (date may be approximate).     Assessment:   This is a routine wellness examination for Banks.  Hearing/Vision screen Hearing Screening - Comments:: Noticed some decreased hearing Vision Screening - Comments:: Wears eyeglasses   Goals Addressed               This Visit's Progress     My goal is to lose 15 pounds. (pt-stated)        Still working on it-2025      Depression Screen    12/10/2023    1:16 PM 11/26/2023   11:16 AM 08/26/2023   11:04 AM 05/21/2023   10:28 AM 02/18/2023   11:19 AM 11/19/2022   11:26 AM 08/20/2022    2:29 PM  PHQ 2/9 Scores  PHQ - 2 Score 0 0 0 0 0 0 0  PHQ- 9 Score 0  6    Fall Risk    12/10/2023    1:13 PM 11/26/2023   11:16 AM  08/26/2023   11:04 AM 05/21/2023   10:28 AM 02/18/2023   11:19 AM  Fall Risk   Falls in the past year? 0 0 0 0 1  Number falls in past yr: 0 0 0 0 0  Injury with Fall? 0 0 0 0 0  Risk for fall due to : No Fall Risks No Fall Risks No Fall Risks No Fall Risks No Fall Risks  Follow up Falls prevention discussed;Falls evaluation completed Falls evaluation completed Falls evaluation completed Falls evaluation completed Falls evaluation completed    MEDICARE RISK AT HOME: Medicare Risk at Home Any stairs in or around the home?: Yes If so, are there any without handrails?: Yes Home free of loose throw rugs in walkways, pet beds, electrical cords, etc?: Yes Adequate lighting in your home to reduce risk of falls?: Yes Life alert?: No Use of a cane, walker or w/c?: No Grab bars in the bathroom?: No Shower chair or bench in shower?: Yes Elevated toilet seat or a handicapped toilet?: No  TIMED UP AND GO:  Was the test performed?  No    Cognitive Function:        12/10/2023    1:14 PM 08/20/2022    2:02 PM  6CIT Screen  What Year? 0 points 0 points  What month? 0 points 0 points  What time? 0 points 0 points  Count back from 20 0 points 0 points  Months in reverse 0 points 0 points  Repeat phrase 0 points 0 points  Total Score 0 points 0 points    Immunizations Immunization History  Administered Date(s) Administered   Fluad Quad(high Dose 65+) 07/31/2022   Influenza Split 11/13/2011, 08/25/2012   Influenza, High Dose Seasonal PF 07/31/2023   Influenza, Quadrivalent, Recombinant, Inj, Pf 08/25/2021   Influenza, Seasonal, Injecte, Preservative Fre 07/29/2014, 10/03/2017   Influenza,inj,Quad PF,6+ Mos 07/27/2013, 10/03/2017, 08/11/2018, 07/20/2019, 07/30/2020   Influenza,inj,Quad PF,6-35 Mos 08/31/2021   Influenza,inj,quad, With Preservative 07/20/2019   Influenza-Unspecified 07/29/2014, 08/16/2015, 08/21/2016, 07/20/2019, 07/22/2019, 07/10/2020   Moderna Covid-19 Fall Seasonal  Vaccine 79yrs & older 07/31/2023   PFIZER Comirnaty(Gray Top)Covid-19 Tri-Sucrose Vaccine 02/26/2021   PFIZER(Purple Top)SARS-COV-2 Vaccination 12/29/2019, 01/19/2020, 08/26/2020   PNEUMOCOCCAL CONJUGATE-20 04/29/2023   Pneumococcal Conjugate-13 01/15/2019   Pneumococcal Polysaccharide-23 03/04/2006, 08/04/2015   Td 12/06/2005, 08/04/2015   Td (Adult),unspecified 12/06/2005, 08/04/2015   Tdap 11/04/2005, 02/22/2015   Zoster Recombinant(Shingrix) 06/15/2020, 01/15/2021    TDAP status: Up to date  Flu Vaccine status: Up to date  Pneumococcal vaccine status: Up to date  Covid-19 vaccine status: Completed vaccines  Qualifies for Shingles Vaccine? Yes   Zostavax completed Yes   Shingrix Completed?: Yes  Screening Tests Health Maintenance  Topic Date Due   FOOT EXAM  03/19/2014   OPHTHALMOLOGY EXAM  07/10/2023   COVID-19 Vaccine (6 - 2024-25 season) 09/25/2023   HEMOGLOBIN A1C  05/18/2024   Diabetic kidney evaluation - Urine ACR  08/24/2024   Diabetic kidney evaluation - eGFR measurement  11/18/2024   Medicare Annual Wellness (AWV)  12/09/2024   DTaP/Tdap/Td (7 - Td or Tdap) 08/03/2025   Colonoscopy  01/08/2028   Pneumonia Vaccine 31+ Years old  Completed   INFLUENZA VACCINE  Completed   Hepatitis C Screening  Completed   Zoster Vaccines- Shingrix  Completed   HPV VACCINES  Aged Out    Health Maintenance  Health Maintenance Due  Topic Date Due   FOOT EXAM  03/19/2014   OPHTHALMOLOGY EXAM  07/10/2023   COVID-19 Vaccine (6 - 2024-25 season) 09/25/2023    Colorectal cancer screening: Type of screening: Colonoscopy. Completed 01/07/2018. Repeat every 10 years  Lung Cancer Screening: (Low Dose CT Chest recommended if Age 54-80 years, 20 pack-year currently smoking OR have quit w/in 15years.) does not qualify.   Lung Cancer Screening Referral: N/A  Additional Screening:  Hepatitis C Screening: does qualify; Completed 01/31/2010  Vision Screening: Recommended annual  ophthalmology exams for early detection of glaucoma and other disorders of the eye. Is the patient up to date with their annual eye exam?  Yes  Who is the provider or what is the name of the [facility] in which the patient [facility] [place] If pt is not established with a provider, would they like to be referred to a provider to establish care? No .   Dental Screening: Recommended annual dental exams for proper oral hygiene  Diabetic Foot Exam: Diabetic Foot Exam: Overdue, Pt has been advised about the importance in completing this exam. Pt is scheduled for diabetic foot exam on 12/31/23.  Community Resource Referral / Chronic Care Management: CRR required this visit?  No   CCM required this visit?  No     Plan:     I have personally reviewed and noted the following in the patient's chart:   Medical and social history Use of alcohol, tobacco or illicit drugs  Current medications and supplements including opioid prescriptions. Patient is not currently taking opioid prescriptions. Functional ability and status Nutritional status Physical activity Advanced directives List of other physicians Hospitalizations, surgeries, and ER visits in previous 12 months Vitals Screenings to include cognitive, depression, and falls Referrals and appointments  In addition, I have reviewed and discussed with patient certain preventive protocols, quality metrics, and best practice recommendations. A written personalized care plan for preventive services as well as general preventive health recommendations were provided to patient.     Riann Oman L Gordon Carlson, CMA   12/10/2023   After Visit Summary: (MyChart) Due to this being a telephonic visit, the after visit summary with patients personalized plan was offered to patient via MyChart   Nurse Notes: Patient is due for a foot exam and can get that during his up coming office visit with Dr. Garald.  He is also due for a Eye exam,  which patient stated that he has one coming up on May 29,2025.  Patient is up to date on all other health maintenance with no concerns to address today.  Medical screening examination/treatment/procedure(s) were performed by non-physician practitioner and as supervising physician I was immediately available for consultation/collaboration.  I agree with above. Karlynn Garald, MD

## 2023-12-31 ENCOUNTER — Ambulatory Visit: Payer: Medicare Other | Admitting: Internal Medicine

## 2024-01-04 ENCOUNTER — Other Ambulatory Visit: Payer: Self-pay | Admitting: Internal Medicine

## 2024-02-02 DIAGNOSIS — E119 Type 2 diabetes mellitus without complications: Secondary | ICD-10-CM | POA: Diagnosis not present

## 2024-02-02 LAB — HM DIABETES EYE EXAM

## 2024-02-10 ENCOUNTER — Encounter: Payer: Self-pay | Admitting: Internal Medicine

## 2024-02-18 ENCOUNTER — Other Ambulatory Visit: Payer: Self-pay | Admitting: Family

## 2024-02-18 MED ORDER — EZETIMIBE 10 MG PO TABS: 10.0000 mg | ORAL_TABLET | Freq: Every day | ORAL | 3 refills | Status: AC

## 2024-02-25 ENCOUNTER — Ambulatory Visit: Payer: Medicare Other | Admitting: Internal Medicine

## 2024-03-14 ENCOUNTER — Other Ambulatory Visit: Payer: Self-pay | Admitting: Internal Medicine

## 2024-03-25 DIAGNOSIS — K080 Exfoliation of teeth due to systemic causes: Secondary | ICD-10-CM | POA: Diagnosis not present

## 2024-04-08 DIAGNOSIS — K080 Exfoliation of teeth due to systemic causes: Secondary | ICD-10-CM | POA: Diagnosis not present

## 2024-04-19 ENCOUNTER — Other Ambulatory Visit: Payer: Self-pay

## 2024-04-19 ENCOUNTER — Other Ambulatory Visit

## 2024-04-19 DIAGNOSIS — E1121 Type 2 diabetes mellitus with diabetic nephropathy: Secondary | ICD-10-CM

## 2024-04-19 DIAGNOSIS — E291 Testicular hypofunction: Secondary | ICD-10-CM

## 2024-04-20 LAB — CBC WITH DIFFERENTIAL/PLATELET
Absolute Lymphocytes: 1759 {cells}/uL (ref 850–3900)
Absolute Monocytes: 657 {cells}/uL (ref 200–950)
Basophils Absolute: 22 {cells}/uL (ref 0–200)
Basophils Relative: 0.3 %
Eosinophils Absolute: 58 {cells}/uL (ref 15–500)
Eosinophils Relative: 0.8 %
HCT: 45.2 % (ref 38.5–50.0)
Hemoglobin: 14.1 g/dL (ref 13.2–17.1)
MCH: 26.6 pg — ABNORMAL LOW (ref 27.0–33.0)
MCHC: 31.2 g/dL — ABNORMAL LOW (ref 32.0–36.0)
MCV: 85.3 fL (ref 80.0–100.0)
MPV: 11.5 fL (ref 7.5–12.5)
Monocytes Relative: 9 %
Neutro Abs: 4803 {cells}/uL (ref 1500–7800)
Neutrophils Relative %: 65.8 %
Platelets: 162 10*3/uL (ref 140–400)
RBC: 5.3 10*6/uL (ref 4.20–5.80)
RDW: 17.7 % — ABNORMAL HIGH (ref 11.0–15.0)
Total Lymphocyte: 24.1 %
WBC: 7.3 10*3/uL (ref 3.8–10.8)

## 2024-04-20 LAB — COMPREHENSIVE METABOLIC PANEL WITH GFR
AG Ratio: 1.7 (calc) (ref 1.0–2.5)
ALT: 21 U/L (ref 9–46)
AST: 18 U/L (ref 10–35)
Albumin: 4.2 g/dL (ref 3.6–5.1)
Alkaline phosphatase (APISO): 69 U/L (ref 35–144)
BUN/Creatinine Ratio: 11 (calc) (ref 6–22)
BUN: 17 mg/dL (ref 7–25)
CO2: 29 mmol/L (ref 20–32)
Calcium: 9.4 mg/dL (ref 8.6–10.3)
Chloride: 104 mmol/L (ref 98–110)
Creat: 1.56 mg/dL — ABNORMAL HIGH (ref 0.70–1.35)
Globulin: 2.5 g/dL (ref 1.9–3.7)
Glucose, Bld: 106 mg/dL — ABNORMAL HIGH (ref 65–99)
Potassium: 4.1 mmol/L (ref 3.5–5.3)
Sodium: 140 mmol/L (ref 135–146)
Total Bilirubin: 1.1 mg/dL (ref 0.2–1.2)
Total Protein: 6.7 g/dL (ref 6.1–8.1)
eGFR: 48 mL/min/{1.73_m2} — ABNORMAL LOW (ref >=60)

## 2024-04-20 LAB — HEMOGLOBIN A1C
Hgb A1c MFr Bld: 6.1 % — ABNORMAL HIGH (ref <5.7)
Mean Plasma Glucose: 128 mg/dL
eAG (mmol/L): 7.1 mmol/L

## 2024-04-20 LAB — TESTOSTERONE: Testosterone: 1113 ng/dL — ABNORMAL HIGH (ref 250–827)

## 2024-04-21 ENCOUNTER — Ambulatory Visit: Payer: Self-pay | Admitting: Internal Medicine

## 2024-04-22 ENCOUNTER — Encounter: Payer: Self-pay | Admitting: Internal Medicine

## 2024-04-22 ENCOUNTER — Ambulatory Visit: Admitting: Internal Medicine

## 2024-04-22 VITALS — BP 112/70 | HR 86 | Temp 98.9°F | Ht 69.0 in | Wt 187.0 lb

## 2024-04-22 DIAGNOSIS — Z7984 Long term (current) use of oral hypoglycemic drugs: Secondary | ICD-10-CM

## 2024-04-22 DIAGNOSIS — I10 Essential (primary) hypertension: Secondary | ICD-10-CM

## 2024-04-22 DIAGNOSIS — K219 Gastro-esophageal reflux disease without esophagitis: Secondary | ICD-10-CM | POA: Diagnosis not present

## 2024-04-22 DIAGNOSIS — E291 Testicular hypofunction: Secondary | ICD-10-CM | POA: Diagnosis not present

## 2024-04-22 DIAGNOSIS — M2550 Pain in unspecified joint: Secondary | ICD-10-CM | POA: Diagnosis not present

## 2024-04-22 DIAGNOSIS — E1121 Type 2 diabetes mellitus with diabetic nephropathy: Secondary | ICD-10-CM

## 2024-04-22 MED ORDER — TESTOSTERONE CYPIONATE 200 MG/ML IM SOLN: 200.0000 mg | INTRAMUSCULAR | 5 refills | Status: DC

## 2024-04-22 MED ORDER — AMLODIPINE BESYLATE 10 MG PO TABS: ORAL_TABLET | ORAL | 3 refills | Status: AC

## 2024-04-22 MED ORDER — SEMAGLUTIDE (2 MG/DOSE) 8 MG/3ML ~~LOC~~ SOPN: 2.0000 mg | PEN_INJECTOR | SUBCUTANEOUS | 3 refills | Status: AC

## 2024-04-22 MED ORDER — ATORVASTATIN CALCIUM 80 MG PO TABS: 80.0000 mg | ORAL_TABLET | Freq: Every day | ORAL | 3 refills | Status: AC

## 2024-04-22 MED ORDER — CARVEDILOL 12.5 MG PO TABS: 12.5000 mg | ORAL_TABLET | Freq: Two times a day (BID) | ORAL | 3 refills | Status: AC

## 2024-04-22 MED ORDER — REPAGLINIDE 1 MG PO TABS: 1.0000 mg | ORAL_TABLET | Freq: Three times a day (TID) | ORAL | 3 refills | Status: AC

## 2024-04-22 MED ORDER — BD SYRINGE/NEEDLE 22G X 1-1/2" 3 ML MISC: 1 refills | Status: AC

## 2024-04-22 MED ORDER — ZOLPIDEM TARTRATE 10 MG PO TABS: 10.0000 mg | ORAL_TABLET | Freq: Every evening | ORAL | 3 refills | Status: AC | PRN

## 2024-04-22 NOTE — Assessment & Plan Note (Signed)
On Nexium  Better after we d/c Metformin?

## 2024-04-22 NOTE — Assessment & Plan Note (Signed)
Chronic  Continue on amlodipine, Coreg

## 2024-04-22 NOTE — Patient Instructions (Signed)
 Infrared Sauna Blanket

## 2024-04-22 NOTE — Assessment & Plan Note (Signed)
  On Ozempic  2 mg/wk A1c q 3 mo

## 2024-04-22 NOTE — Progress Notes (Signed)
 Subjective:  Patient ID: Jacob Gordon, male    DOB: [birth year]  Age: 68 y.o. MRN: [id number]  CC: Medical Management of Chronic Issues   HPI Jacob Gordon presents for DM, HTN, dyslipidemia  Outpatient Medications Prior to Visit  Medication Sig Dispense Refill   aspirin  EC 81 MG tablet Take 81 mg by mouth daily.     Cholecalciferol (VITAMIN D -3) 125 MCG (5000 UT) TABS Take 5,000 Units by mouth daily.     diazepam  (VALIUM ) 5 MG tablet Take 0.5-1 tablets (2.5-5 mg total) by mouth 2 (two) times daily as needed for anxiety. 40 tablet 1   ezetimibe  (ZETIA ) 10 MG tablet Take 1 tablet (10 mg total) by mouth daily. 30 tablet 3   fluticasone  (FLONASE ) 50 MCG/ACT nasal spray Place 2 sprays into both nostrils daily. (Patient taking differently: Place 2 sprays into both nostrils daily as needed.) 16 g 1   Galcanezumab -gnlm (EMGALITY ) 120 MG/ML SOAJ Inject 120 mg into the skin every 30 (thirty) days. 1.12 mL 11   HYDROcodone -acetaminophen  (NORCO) 5-325 MG tablet Take 1 tablet by mouth every 6 (six) hours as needed for severe pain (pain score 7-10) (Renal colic). 20 tablet 0   metaxalone (SKELAXIN) 800 MG tablet Take 800 mg by mouth 3 (three) times daily.     OVER THE COUNTER MEDICATION Take 1 capsule by mouth daily. Suprema Dophilus Supplement - probiotic     pantoprazole  (PROTONIX ) 40 MG tablet TAKE 1 TABLET BY MOUTH DAILY 30 tablet 11   tadalafil  (CIALIS ) 5 MG tablet TAKE 1 TABLET BY MOUTH DAILY 90 tablet 1   tamsulosin  (FLOMAX ) 0.4 MG CAPS capsule Take 1 capsule (0.4 mg total) by mouth daily as needed. Renal colic 90 capsule 1   tretinoin (RETIN-A) 0.05 % cream 1 application in the evening to face Externally Pea size amount to whole face at night; Duration: 30 days     Ubrogepant  (UBRELVY ) 50 MG TABS Take 50mg  by mouth every 2 hours as needed for migraines   Maximum 100 mg daily 16 tablet 11   amLODipine  (NORVASC ) 10 MG tablet TAKE 1 TABLET BY MOUTH EVERY DAY. 90 tablet 3    atorvastatin  (LIPITOR) 80 MG tablet TAKE 1 TABLET BY MOUTH DAILY 90 tablet 3   carvedilol  (COREG ) 12.5 MG tablet Take 1 tablet (12.5 mg total) by mouth 2 (two) times daily with a meal. 180 tablet 3   repaglinide  (PRANDIN ) 1 MG tablet TAKE ONE TABLET BY MOUTH THREE TIMES A DAY BEFORE MEALS 270 tablet 3   Semaglutide , 2 MG/DOSE, 8 MG/3ML SOPN Inject 2 mg as directed once a week. 9 mL 3   SYRINGE-NEEDLE, DISP, 3 ML (B-D SYRINGE/NEEDLE 3CC/22GX1.5) 22G X 1-1/2 3 ML MISC Use as directed IM 50 each 1   testosterone  cypionate (DEPOTESTOSTERONE CYPIONATE) 200 MG/ML injection Inject 1 mL (200 mg total) into the muscle every 14 (fourteen) days. 10 mL 5   zolpidem  (AMBIEN ) 10 MG tablet TAKE 1/2 TO 1 TABLET BY MOUTH EVERY NIGHT AT BEDTIME AS NEEDED FOR SLEEP 30 tablet 3   No facility-administered medications prior to visit.    ROS: Review of Systems  Constitutional:  Negative for appetite change, fatigue and unexpected weight change.  HENT:  Negative for congestion, nosebleeds, sneezing, sore throat and trouble swallowing.   Eyes:  Negative for itching and visual disturbance.  Respiratory:  Negative for cough.   Cardiovascular:  Negative for chest pain, palpitations and leg swelling.  Gastrointestinal:  Negative for abdominal distention,  blood in stool, diarrhea and nausea.  Genitourinary:  Negative for frequency and hematuria.  Musculoskeletal:  Positive for arthralgias and back pain. Negative for gait problem, joint swelling and neck pain.  Skin:  Negative for rash.  Neurological:  Negative for dizziness, tremors, speech difficulty and weakness.  Psychiatric/Behavioral:  Negative for agitation, dysphoric mood and sleep disturbance. The patient is not nervous/anxious.     Objective:  BP 112/70   Pulse 86   Temp 98.9 F (37.2 C) (Oral)   Ht 5' 9 (1.753 m)   Wt 187 lb (84.8 kg)   SpO2 98%   BMI 27.62 kg/m   BP Readings from Last 3 Encounters:  04/22/24 112/70  11/26/23 110/70  09/10/23  126/82    Wt Readings from Last 3 Encounters:  04/22/24 187 lb (84.8 kg)  12/10/23 190 lb (86.2 kg)  11/26/23 190 lb (86.2 kg)    Physical Exam Constitutional:      General: He is not in acute distress.    Appearance: He is well-developed.     Comments: NAD   Eyes:     Conjunctiva/sclera: Conjunctivae normal.     Pupils: Pupils are equal, round, and reactive to light.   Neck:     Thyroid : No thyromegaly.     Vascular: No JVD.   Cardiovascular:     Rate and Rhythm: Normal rate and regular rhythm.     Heart sounds: Normal heart sounds. No murmur heard.    No friction rub. No gallop.  Pulmonary:     Effort: Pulmonary effort is normal. No respiratory distress.     Breath sounds: Normal breath sounds. No wheezing or rales.  Chest:     Chest wall: No tenderness.  Abdominal:     General: Bowel sounds are normal. There is no distension.     Palpations: Abdomen is soft. There is no mass.     Tenderness: There is no abdominal tenderness. There is no guarding or rebound.   Musculoskeletal:        General: Tenderness present. Normal range of motion.     Cervical back: Normal range of motion.     Right lower leg: No edema.     Left lower leg: No edema.  Lymphadenopathy:     Cervical: No cervical adenopathy.   Skin:    General: Skin is warm and dry.     Findings: No rash.   Neurological:     Mental Status: He is alert and oriented to person, place, and time.     Cranial Nerves: No cranial nerve deficit.     Motor: No abnormal muscle tone.     Coordination: Coordination normal.     Gait: Gait normal.     Deep Tendon Reflexes: Reflexes are normal and symmetric.   Psychiatric:        Behavior: Behavior normal.        Thought Content: Thought content normal.        Judgment: Judgment normal.     Lab Results  Component Value Date   WBC 7.3 04/19/2024   HGB 14.1 04/19/2024   HCT 45.2 04/19/2024   PLT 162 04/19/2024   GLUCOSE 106 (H) 04/19/2024   CHOL 116 04/23/2022    TRIG 63.0 04/23/2022   HDL 40.60 04/23/2022   LDLDIRECT 184.1 10/20/2012   LDLCALC 63 04/23/2022   ALT 21 04/19/2024   AST 18 04/19/2024   NA 140 04/19/2024   K 4.1 04/19/2024   CL 104 04/19/2024  CREATININE 1.56 (H) 04/19/2024   BUN 17 04/19/2024   CO2 29 04/19/2024   TSH 0.72 11/19/2022   PSA 1.13 12/13/2020   INR 1.2 07/22/2019   HGBA1C 6.1 (H) 04/19/2024   MICROALBUR 1.3 08/25/2023    CT RENAL STONE STUDY Result Date: 12/27/2021 CLINICAL DATA:  Left renal colic. EXAM: CT ABDOMEN AND PELVIS WITHOUT CONTRAST TECHNIQUE: Multidetector CT imaging of the abdomen and pelvis was performed following the standard protocol without IV contrast. RADIATION DOSE REDUCTION: This exam was performed according to the departmental dose-optimization program which includes automated exposure control, adjustment of the mA and/or kV according to patient size and/or use of iterative reconstruction technique. COMPARISON:  CT with IV contrast 06/12/2015 FINDINGS: Lower chest: There is left lower lobe linear scarring or atelectasis. There are no lung base infiltrates. The cardiac size is normal. Small chronic pericardial effusion is noted anteriorly and calcification in the distal LAD and right coronary arteries. Hepatobiliary: Several scattered hepatic cysts. Largest in the right lobe is 2.7 cm and 7.4 Hounsfield units. Largest in the left lobe is 2.3 cm in 7.3 Hounsfield units. Some were present previously and larger than previously and some are new. There are additional too small to characterize scattered hypodensities. Gallbladder and bile ducts are unremarkable. Pancreas: Unremarkable without contrast. Spleen: Unremarkable without contrast. Adrenals/Urinary Tract: No adrenal mass is seen. Small cyst again noted posteriorly in the upper pole left kidney. The renal cortex otherwise unremarkable without contrast. There are several scattered punctate nonobstructive caliceal stones in the upper to midpole right  kidney, and scattered punctate up to 1-2 mm stones in the left renal collecting system. No hydronephrosis or ureteral stones are seen although please note the distal right ureter below the level of the acetabulum is obscured by metal artifact from a right hip arthroplasty. Bladder wall is thickened which was seen previously, but is not fully distended. Stomach/Bowel: No dilatation or wall thickening including the appendix. Moderate fecal stasis. There are colonic diverticula without evidence of colitis or diverticulitis. Vascular/Lymphatic: There is moderate aortoiliac calcific plaque. There is no AAA but there is ectasia in the common iliac arteries, which measure 1.7 cm on the right and 1.4 cm on the left. Scattered calcific plaque in the branch arteries. Reproductive: Enlarged prostate, measuring 4.5 cm with bladder impression. Other: Small umbilical fat hernia. No incarcerated hernia. No free air, hemorrhage or fluid. Musculoskeletal: There are chronic L5 pars defects without L5-S1 spondylolisthesis. No acute or significant osseous findings. Right hip arthroplasty. Left hip DJD. IMPRESSION: 1. There is nonobstructive micronephrolithiasis. No hydronephrosis or ureteral stone is seen, but with the most distal right ureter obscured by his right hip arthroplasty. 2. Thickened bladder which could be due to nondistention, hypertrophy or cystitis. 3. Prostatomegaly. 4. Aortic and coronary artery atherosclerosis. 5. Hepatic cysts and additional too small to characterize hypodensities. 6. Constipation with diverticulosis. 7. Umbilical fat hernia. Electronically Signed   By: Denman Fischer M.D.   On: 12/27/2021 03:20    Assessment & Plan:   Problem List Items Addressed This Visit     Diabetes mellitus type 2, controlled (HCC)    On Ozempic   2 mg/wk A1c q 3 mo      Relevant Medications   atorvastatin  (LIPITOR) 80 MG tablet   repaglinide  (PRANDIN ) 1 MG tablet   Semaglutide , 2 MG/DOSE, 8 MG/3ML SOPN   Other  Relevant Orders   TSH   Urinalysis   CBC with Differential/Platelet   Lipid panel   PSA  Comprehensive metabolic panel with GFR   Hemoglobin A1c   Testosterone    Microalbumin / creatinine urine ratio   HTN (hypertension)   Chronic  Continue on amlodipine , Coreg       Relevant Medications   amLODipine  (NORVASC ) 10 MG tablet   atorvastatin  (LIPITOR) 80 MG tablet   carvedilol  (COREG ) 12.5 MG tablet   Other Relevant Orders   TSH   Urinalysis   CBC with Differential/Platelet   Lipid panel   PSA   Comprehensive metabolic panel with GFR   Hemoglobin A1c   Testosterone    Microalbumin / creatinine urine ratio   GERD (gastroesophageal reflux disease) - Primary   On Nexium   Better after we d/c Metformin ?      Arthralgia   Infrared Sauna Blanket      Hypogonadism male   Relevant Orders   Testosterone       Meds ordered this encounter  Medications   amLODipine  (NORVASC ) 10 MG tablet    Sig: TAKE 1 TABLET BY MOUTH EVERY DAY.    Dispense:  90 tablet    Refill:  3   atorvastatin  (LIPITOR) 80 MG tablet    Sig: Take 1 tablet (80 mg total) by mouth daily.    Dispense:  90 tablet    Refill:  3   carvedilol  (COREG ) 12.5 MG tablet    Sig: Take 1 tablet (12.5 mg total) by mouth 2 (two) times daily with a meal.    Dispense:  180 tablet    Refill:  3   repaglinide  (PRANDIN ) 1 MG tablet    Sig: Take 1 tablet (1 mg total) by mouth 3 (three) times daily before meals.    Dispense:  270 tablet    Refill:  3   Semaglutide , 2 MG/DOSE, 8 MG/3ML SOPN    Sig: Inject 2 mg as directed once a week.    Dispense:  9 mL    Refill:  3   SYRINGE-NEEDLE, DISP, 3 ML (B-D SYRINGE/NEEDLE 3CC/22GX1.5) 22G X 1-1/2 3 ML MISC    Sig: Use as directed IM    Dispense:  50 each    Refill:  1   testosterone  cypionate (DEPOTESTOSTERONE CYPIONATE) 200 MG/ML injection    Sig: Inject 1 mL (200 mg total) into the muscle every 14 (fourteen) days.    Dispense:  10 mL    Refill:  5   zolpidem  (AMBIEN ) 10  MG tablet    Sig: Take 1 tablet (10 mg total) by mouth at bedtime as needed for sleep.    Dispense:  30 tablet    Refill:  3      Follow-up: Return in about 6 months (around 10/22/2024) for a follow-up visit.  Anitra Barn, MD

## 2024-04-22 NOTE — Assessment & Plan Note (Signed)
 Infrared Sauna Blanket

## 2024-04-30 ENCOUNTER — Telehealth: Payer: Self-pay

## 2024-04-30 ENCOUNTER — Other Ambulatory Visit (HOSPITAL_COMMUNITY): Payer: Self-pay

## 2024-04-30 NOTE — Telephone Encounter (Signed)
 Pharmacy Patient Advocate Encounter   Received notification from CoverMyMeds that prior authorization for Ozempic   is required/requested.   Insurance verification completed.   The patient is insured through [employer].   Per test claim: The current 84 day co-pay is, $135.00.  No PA needed at this time. This test claim was processed through [facility]- copay amounts may vary at other pharmacies due to pharmacy/plan contracts, or as the patient moves through the different stages of their insurance plan.     Key: [id number]

## 2024-05-12 DIAGNOSIS — K080 Exfoliation of teeth due to systemic causes: Secondary | ICD-10-CM | POA: Diagnosis not present

## 2024-06-08 DIAGNOSIS — K080 Exfoliation of teeth due to systemic causes: Secondary | ICD-10-CM | POA: Diagnosis not present

## 2024-06-28 DIAGNOSIS — K080 Exfoliation of teeth due to systemic causes: Secondary | ICD-10-CM | POA: Diagnosis not present

## 2024-07-14 DIAGNOSIS — K080 Exfoliation of teeth due to systemic causes: Secondary | ICD-10-CM | POA: Diagnosis not present

## 2024-07-15 ENCOUNTER — Encounter: Payer: Self-pay | Admitting: Internal Medicine

## 2024-07-27 ENCOUNTER — Ambulatory Visit (INDEPENDENT_AMBULATORY_CARE_PROVIDER_SITE_OTHER): Admitting: Internal Medicine

## 2024-07-27 ENCOUNTER — Encounter: Payer: Self-pay | Admitting: Internal Medicine

## 2024-07-27 VITALS — BP 116/86 | HR 84 | Temp 98.6°F | Ht 69.0 in | Wt 190.4 lb

## 2024-07-27 DIAGNOSIS — E1121 Type 2 diabetes mellitus with diabetic nephropathy: Secondary | ICD-10-CM

## 2024-07-27 DIAGNOSIS — I10 Essential (primary) hypertension: Secondary | ICD-10-CM | POA: Diagnosis not present

## 2024-07-27 DIAGNOSIS — J3089 Other allergic rhinitis: Secondary | ICD-10-CM

## 2024-07-27 MED ORDER — MONTELUKAST SODIUM 10 MG PO TABS: 10.0000 mg | ORAL_TABLET | Freq: Every day | ORAL | 3 refills | Status: DC

## 2024-07-27 MED ORDER — PSEUDOEPHEDRINE HCL ER 120 MG PO TB12: 120.0000 mg | ORAL_TABLET | Freq: Two times a day (BID) | ORAL | 1 refills | Status: DC | PRN

## 2024-07-27 MED ORDER — CETIRIZINE HCL 10 MG PO TABS: 10.0000 mg | ORAL_TABLET | Freq: Every day | ORAL | 3 refills | Status: DC

## 2024-07-27 MED ORDER — AZELASTINE HCL 0.1 % NA SOLN: 2.0000 | Freq: Two times a day (BID) | NASAL | 5 refills | Status: DC

## 2024-07-27 MED ORDER — METHYLPREDNISOLONE 4 MG PO TBPK: ORAL_TABLET | ORAL | 0 refills | Status: DC

## 2024-07-27 MED ORDER — MOMETASONE FUROATE 50 MCG/ACT NA SUSP: 2.0000 | Freq: Every day | NASAL | 2 refills | Status: AC

## 2024-07-27 NOTE — Assessment & Plan Note (Signed)
 Discussed using Medrol  pack and its potential effect on blood sugars

## 2024-07-27 NOTE — Assessment & Plan Note (Signed)
 Blood pressure is good.  He should be able to tolerate Sudafed as needed

## 2024-07-27 NOTE — Assessment & Plan Note (Signed)
 Severe refractory symptoms of nasal congestion since July.  It seems to be a seasonal problem.  Start Zyrtec  10 mg daily and.  Astelin  2 sprays twice daily, Nasonex  2 sprays once daily, Sudafed 12-hour twice daily as needed, Medrol  pack.  Start Singulair  10 mg daily No signs of infection

## 2024-07-27 NOTE — Progress Notes (Signed)
 Subjective:  Patient ID: Jacob Gordon, male    DOB: 17-May-1956  Age: 68 y.o. MRN: [id number]  CC: Nasal Congestion (Been going on since July. )   HPI Jacob Gordon presents for nasal congestion since July. [name] tried Afrin, Nyquil, Mucinex - no help Follow-up on diabetes, hypertension  Outpatient Medications Prior to Visit  Medication Sig Dispense Refill   amLODipine  (NORVASC ) 10 MG tablet TAKE 1 TABLET BY MOUTH EVERY DAY. 90 tablet 3   aspirin  EC 81 MG tablet Take 81 mg by mouth daily.     atorvastatin  (LIPITOR) 80 MG tablet Take 1 tablet (80 mg total) by mouth daily. 90 tablet 3   carvedilol  (COREG ) 12.5 MG tablet Take 1 tablet (12.5 mg total) by mouth 2 (two) times daily with a meal. 180 tablet 3   Cholecalciferol (VITAMIN D -3) 125 MCG (5000 UT) TABS Take 5,000 Units by mouth daily.     OVER THE COUNTER MEDICATION Take 1 capsule by mouth daily. Suprema Dophilus Supplement - probiotic     repaglinide  (PRANDIN ) 1 MG tablet Take 1 tablet (1 mg total) by mouth 3 (three) times daily before meals. 270 tablet 3   Semaglutide , 2 MG/DOSE, 8 MG/3ML SOPN Inject 2 mg as directed once a week. 9 mL 3   SYRINGE-NEEDLE, DISP, 3 ML (B-D SYRINGE/NEEDLE 3CC/22GX1.5) 22G X 1-1/2 3 ML MISC Use as directed IM 50 each 1   tadalafil  (CIALIS ) 5 MG tablet TAKE 1 TABLET BY MOUTH DAILY 90 tablet 1   tamsulosin  (FLOMAX ) 0.4 MG CAPS capsule Take 1 capsule (0.4 mg total) by mouth daily as needed. Renal colic 90 capsule 1   testosterone  cypionate (DEPOTESTOSTERONE CYPIONATE) 200 MG/ML injection Inject 1 mL (200 mg total) into the muscle every 14 (fourteen) days. 10 mL 5   diazepam  (VALIUM ) 5 MG tablet Take 0.5-1 tablets (2.5-5 mg total) by mouth 2 (two) times daily as needed for anxiety. (Patient not taking: Reported on 07/27/2024) 40 tablet 1   ezetimibe  (ZETIA ) 10 MG tablet Take 1 tablet (10 mg total) by mouth daily. (Patient not taking: Reported on 07/27/2024) 30 tablet 3   Galcanezumab -gnlm  (EMGALITY ) 120 MG/ML SOAJ Inject 120 mg into the skin every 30 (thirty) days. (Patient not taking: Reported on 07/27/2024) 1.12 mL 11   HYDROcodone -acetaminophen  (NORCO) 5-325 MG tablet Take 1 tablet by mouth every 6 (six) hours as needed for severe pain (pain score 7-10) (Renal colic). 20 tablet 0   metaxalone (SKELAXIN) 800 MG tablet Take 800 mg by mouth 3 (three) times daily.     pantoprazole  (PROTONIX ) 40 MG tablet TAKE 1 TABLET BY MOUTH DAILY 30 tablet 11   tretinoin (RETIN-A) 0.05 % cream 1 application in the evening to face Externally Pea size amount to whole face at night; Duration: 30 days     Ubrogepant  (UBRELVY ) 50 MG TABS Take 50mg  by mouth every 2 hours as needed for migraines   Maximum 100 mg daily 16 tablet 11   zolpidem  (AMBIEN ) 10 MG tablet Take 1 tablet (10 mg total) by mouth at bedtime as needed for sleep. 30 tablet 3   fluticasone  (FLONASE ) 50 MCG/ACT nasal spray Place 2 sprays into both nostrils daily. (Patient not taking: Reported on 07/27/2024) 16 g 1   No facility-administered medications prior to visit.    ROS: Review of Systems  Constitutional:  Positive for fatigue. Negative for appetite change and unexpected weight change.  HENT:  Positive for congestion, rhinorrhea and sinus pressure. Negative for nosebleeds,  postnasal drip, sinus pain, sneezing, sore throat and trouble swallowing.   Eyes:  Negative for itching and visual disturbance.  Respiratory:  Positive for shortness of breath. Negative for cough.   Cardiovascular:  Negative for chest pain, palpitations and leg swelling.  Gastrointestinal:  Negative for abdominal distention, blood in stool, diarrhea and nausea.  Genitourinary:  Negative for frequency and hematuria.  Musculoskeletal:  Negative for back pain, gait problem, joint swelling and neck pain.  Skin:  Negative for rash.  Neurological:  Negative for dizziness, tremors, speech difficulty and weakness.  Psychiatric/Behavioral:  Negative for agitation,  dysphoric mood and sleep disturbance. The patient is not nervous/anxious.     Objective:  BP 116/86   Pulse 84   Temp 98.6 F (37 C) (Oral)   Ht 5' 9 (1.753 m)   Wt 190 lb 6.4 oz (86.4 kg)   SpO2 97%   BMI 28.12 kg/m   BP Readings from Last 3 Encounters:  07/27/24 116/86  04/22/24 112/70  11/26/23 110/70    Wt Readings from Last 3 Encounters:  07/27/24 190 lb 6.4 oz (86.4 kg)  04/22/24 187 lb (84.8 kg)  12/10/23 190 lb (86.2 kg)    Physical Exam Constitutional:      General: He is not in acute distress.    Appearance: He is well-developed. He is not toxic-appearing.     Comments: NAD  HENT:     Nose: Congestion and rhinorrhea present.     Mouth/Throat:     Pharynx: Posterior oropharyngeal erythema present. No oropharyngeal exudate.  Eyes:     Conjunctiva/sclera: Conjunctivae normal.     Pupils: Pupils are equal, round, and reactive to light.  Neck:     Thyroid : No thyromegaly.     Vascular: No JVD.  Cardiovascular:     Rate and Rhythm: Normal rate and regular rhythm.     Heart sounds: Normal heart sounds. No murmur heard.    No friction rub. No gallop.  Pulmonary:     Effort: Pulmonary effort is normal. No respiratory distress.     Breath sounds: Normal breath sounds. No wheezing or rales.  Chest:     Chest wall: No tenderness.  Abdominal:     General: Bowel sounds are normal. There is no distension.     Palpations: Abdomen is soft. There is no mass.     Tenderness: There is no abdominal tenderness. There is no guarding or rebound.  Musculoskeletal:        General: No tenderness. Normal range of motion.     Cervical back: Normal range of motion.  Lymphadenopathy:     Cervical: No cervical adenopathy.  Skin:    General: Skin is warm and dry.     Findings: No rash.  Neurological:     Mental Status: He is alert and oriented to person, place, and time.     Cranial Nerves: No cranial nerve deficit.     Motor: No abnormal muscle tone.     Coordination:  Coordination normal.     Gait: Gait normal.     Deep Tendon Reflexes: Reflexes are normal and symmetric.  Psychiatric:        Behavior: Behavior normal.        Thought Content: Thought content normal.        Judgment: Judgment normal.   Nasal voice.  Swollen nasal passages.  No colored discharge  Lab Results  Component Value Date   WBC 7.3 04/19/2024   HGB 14.1 04/19/2024  HCT 45.2 04/19/2024   PLT 162 04/19/2024   GLUCOSE 106 (H) 04/19/2024   CHOL 116 04/23/2022   TRIG 63.0 04/23/2022   HDL 40.60 04/23/2022   LDLDIRECT 184.1 10/20/2012   LDLCALC 63 04/23/2022   ALT 21 04/19/2024   AST 18 04/19/2024   NA 140 04/19/2024   K 4.1 04/19/2024   CL 104 04/19/2024   CREATININE 1.56 (H) 04/19/2024   BUN 17 04/19/2024   CO2 29 04/19/2024   TSH 0.72 11/19/2022   PSA 1.13 12/13/2020   INR 1.2 07/22/2019   HGBA1C 6.1 (H) 04/19/2024   MICROALBUR 0.7 11/21/2008    CT RENAL STONE STUDY Result Date: 12/27/2021 CLINICAL DATA:  Left renal colic. EXAM: CT ABDOMEN AND PELVIS WITHOUT CONTRAST TECHNIQUE: Multidetector CT imaging of the abdomen and pelvis was performed following the standard protocol without IV contrast. RADIATION DOSE REDUCTION: This exam was performed according to the departmental dose-optimization program which includes automated exposure control, adjustment of the mA and/or kV according to patient size and/or use of iterative reconstruction technique. COMPARISON:  CT with IV contrast 06/12/2015 FINDINGS: Lower chest: There is left lower lobe linear scarring or atelectasis. There are no lung base infiltrates. The cardiac size is normal. Small chronic pericardial effusion is noted anteriorly and calcification in the distal LAD and right coronary arteries. Hepatobiliary: Several scattered hepatic cysts. Largest in the right lobe is 2.7 cm and 7.4 Hounsfield units. Largest in the left lobe is 2.3 cm in 7.3 Hounsfield units. Some were present previously and larger than previously and  some are new. There are additional too small to characterize scattered hypodensities. Gallbladder and bile ducts are unremarkable. Pancreas: Unremarkable without contrast. Spleen: Unremarkable without contrast. Adrenals/Urinary Tract: No adrenal mass is seen. Small cyst again noted posteriorly in the upper pole left kidney. The renal cortex otherwise unremarkable without contrast. There are several scattered punctate nonobstructive caliceal stones in the upper to midpole right kidney, and scattered punctate up to 1-2 mm stones in the left renal collecting system. No hydronephrosis or ureteral stones are seen although please note the distal right ureter below the level of the acetabulum is obscured by metal artifact from a right hip arthroplasty. Bladder wall is thickened which was seen previously, but is not fully distended. Stomach/Bowel: No dilatation or wall thickening including the appendix. Moderate fecal stasis. There are colonic diverticula without evidence of colitis or diverticulitis. Vascular/Lymphatic: There is moderate aortoiliac calcific plaque. There is no AAA but there is ectasia in the common iliac arteries, which measure 1.7 cm on the right and 1.4 cm on the left. Scattered calcific plaque in the branch arteries. Reproductive: Enlarged prostate, measuring 4.5 cm with bladder impression. Other: Small umbilical fat hernia. No incarcerated hernia. No free air, hemorrhage or fluid. Musculoskeletal: There are chronic L5 pars defects without L5-S1 spondylolisthesis. No acute or significant osseous findings. Right hip arthroplasty. Left hip DJD. IMPRESSION: 1. There is nonobstructive micronephrolithiasis. No hydronephrosis or ureteral stone is seen, but with the most distal right ureter obscured by his right hip arthroplasty. 2. Thickened bladder which could be due to nondistention, hypertrophy or cystitis. 3. Prostatomegaly. 4. Aortic and coronary artery atherosclerosis. 5. Hepatic cysts and additional  too small to characterize hypodensities. 6. Constipation with diverticulosis. 7. Umbilical fat hernia. Electronically Signed   By: Francis Quam M.D.   On: 12/27/2021 03:20    Assessment & Plan:   Problem List Items Addressed This Visit     Allergic rhinitis - Primary  Severe refractory symptoms of nasal congestion since July.  It seems to be a seasonal problem.  Start Zyrtec  10 mg daily and.  Astelin  2 sprays twice daily, Nasonex  2 sprays once daily, Sudafed 12-hour twice daily as needed, Medrol  pack.  Start Singulair  10 mg daily No signs of infection      Diabetes mellitus type 2, controlled (HCC)   Discussed using Medrol  pack and its potential effect on blood sugars      HTN (hypertension)   Blood pressure is good.  He should be able to tolerate Sudafed as needed         Meds ordered this encounter  Medications   cetirizine  (ZYRTEC ) 10 MG tablet    Sig: Take 1 tablet (10 mg total) by mouth daily.    Dispense:  100 tablet    Refill:  3   mometasone  (NASONEX ) 50 MCG/ACT nasal spray    Sig: Place 2 sprays into the nose daily.    Dispense:  17 g    Refill:  2   azelastine  (ASTELIN ) 0.1 % nasal spray    Sig: Place 2 sprays into both nostrils 2 (two) times daily. Use in each nostril as directed    Dispense:  30 mL    Refill:  5   montelukast  (SINGULAIR ) 10 MG tablet    Sig: Take 1 tablet (10 mg total) by mouth at bedtime.    Dispense:  90 tablet    Refill:  3   pseudoephedrine  (SUDAFED) 120 MG 12 hr tablet    Sig: Take 1 tablet (120 mg total) by mouth 2 (two) times daily as needed for congestion.    Dispense:  60 tablet    Refill:  1   methylPREDNISolone  (MEDROL  DOSEPAK) 4 MG TBPK tablet    Sig: As directed    Dispense:  21 tablet    Refill:  0      Follow-up: Return in about 4 weeks (around 08/24/2024) for a follow-up visit.  Marolyn Noel, MD

## 2024-08-05 DIAGNOSIS — L731 Pseudofolliculitis barbae: Secondary | ICD-10-CM | POA: Diagnosis not present

## 2024-08-11 DIAGNOSIS — L731 Pseudofolliculitis barbae: Secondary | ICD-10-CM | POA: Diagnosis not present

## 2024-08-18 ENCOUNTER — Encounter: Payer: Self-pay | Admitting: Internal Medicine

## 2024-08-23 ENCOUNTER — Other Ambulatory Visit: Payer: Self-pay | Admitting: Internal Medicine

## 2024-08-23 DIAGNOSIS — K080 Exfoliation of teeth due to systemic causes: Secondary | ICD-10-CM | POA: Diagnosis not present

## 2024-08-24 ENCOUNTER — Other Ambulatory Visit (INDEPENDENT_AMBULATORY_CARE_PROVIDER_SITE_OTHER)

## 2024-08-24 DIAGNOSIS — E785 Hyperlipidemia, unspecified: Secondary | ICD-10-CM

## 2024-08-24 DIAGNOSIS — E291 Testicular hypofunction: Secondary | ICD-10-CM | POA: Diagnosis not present

## 2024-08-24 DIAGNOSIS — E1121 Type 2 diabetes mellitus with diabetic nephropathy: Secondary | ICD-10-CM

## 2024-08-24 DIAGNOSIS — I10 Essential (primary) hypertension: Secondary | ICD-10-CM | POA: Diagnosis not present

## 2024-08-24 LAB — COMPREHENSIVE METABOLIC PANEL WITH GFR
ALT: 25 U/L (ref 0–53)
AST: 19 U/L (ref 0–37)
Albumin: 4 g/dL (ref 3.5–5.2)
Alkaline Phosphatase: 63 U/L (ref 39–117)
BUN: 13 mg/dL (ref 6–23)
CO2: 30 meq/L (ref 19–32)
Calcium: 8.7 mg/dL (ref 8.4–10.5)
Chloride: 104 meq/L (ref 96–112)
Creatinine, Ser: 1.32 mg/dL (ref 0.40–1.50)
GFR: 55.7 mL/min — ABNORMAL LOW (ref >60.00)
Glucose, Bld: 111 mg/dL — ABNORMAL HIGH (ref 70–99)
Potassium: 4.2 meq/L (ref 3.5–5.1)
Sodium: 140 meq/L (ref 135–145)
Total Bilirubin: 0.8 mg/dL (ref 0.2–1.2)
Total Protein: 6.4 g/dL (ref 6.0–8.3)

## 2024-08-24 LAB — URINALYSIS
Bilirubin Urine: NEGATIVE
Hgb urine dipstick: NEGATIVE
Ketones, ur: NEGATIVE
Leukocytes,Ua: NEGATIVE
Nitrite: NEGATIVE
Specific Gravity, Urine: 1.015 (ref 1.000–1.030)
Total Protein, Urine: NEGATIVE
Urine Glucose: NEGATIVE
Urobilinogen, UA: 1 (ref 0.0–1.0)
pH: 6 (ref 5.0–8.0)

## 2024-08-24 LAB — LIPID PANEL
Cholesterol: 92 mg/dL (ref 0–200)
HDL: 39.5 mg/dL (ref >39.00)
LDL Cholesterol: 46 mg/dL (ref 0–99)
NonHDL: 52.19
Total CHOL/HDL Ratio: 2
Triglycerides: 31 mg/dL (ref 0.0–149.0)
VLDL: 6.2 mg/dL (ref 0.0–40.0)

## 2024-08-24 LAB — CBC WITH DIFFERENTIAL/PLATELET
Basophils Absolute: 0 K/uL (ref 0.0–0.1)
Basophils Relative: 0.4 % (ref 0.0–3.0)
Eosinophils Absolute: 0 K/uL (ref 0.0–0.7)
Eosinophils Relative: 0.8 % (ref 0.0–5.0)
HCT: 42.9 % (ref 39.0–52.0)
Hemoglobin: 13.6 g/dL (ref 13.0–17.0)
Lymphocytes Relative: 25.4 % (ref 12.0–46.0)
Lymphs Abs: 1.5 K/uL (ref 0.7–4.0)
MCHC: 31.8 g/dL (ref 30.0–36.0)
MCV: 82.3 fl (ref 78.0–100.0)
Monocytes Absolute: 0.6 K/uL (ref 0.1–1.0)
Monocytes Relative: 9.5 % (ref 3.0–12.0)
Neutro Abs: 3.7 K/uL (ref 1.4–7.7)
Neutrophils Relative %: 63.9 % (ref 43.0–77.0)
Platelets: 150 K/uL (ref 150.0–400.0)
RBC: 5.21 Mil/uL (ref 4.22–5.81)
RDW: 19.6 % — ABNORMAL HIGH (ref 11.5–15.5)
WBC: 5.8 K/uL (ref 4.0–10.5)

## 2024-08-24 LAB — TSH: TSH: 1.4 u[IU]/mL (ref 0.35–5.50)

## 2024-08-24 LAB — MICROALBUMIN / CREATININE URINE RATIO
Creatinine,U: 251.6 mg/dL
Microalb Creat Ratio: 6 mg/g (ref 0.0–30.0)
Microalb, Ur: 1.5 mg/dL (ref 0.0–1.9)

## 2024-08-24 LAB — HEMOGLOBIN A1C: Hgb A1c MFr Bld: 6.6 % — ABNORMAL HIGH (ref 4.6–6.5)

## 2024-08-24 LAB — TESTOSTERONE: Testosterone: 732.75 ng/dL (ref 300.00–890.00)

## 2024-08-24 LAB — PSA: PSA: 1.68 ng/mL (ref 0.10–4.00)

## 2024-08-25 ENCOUNTER — Ambulatory Visit: Payer: Self-pay | Admitting: Internal Medicine

## 2024-08-25 ENCOUNTER — Encounter: Payer: Self-pay | Admitting: Internal Medicine

## 2024-08-25 ENCOUNTER — Ambulatory Visit: Admitting: Internal Medicine

## 2024-08-25 VITALS — BP 112/74 | HR 69 | Temp 98.4°F | Ht 69.0 in | Wt 191.0 lb

## 2024-08-25 DIAGNOSIS — Z7985 Long-term (current) use of injectable non-insulin antidiabetic drugs: Secondary | ICD-10-CM | POA: Diagnosis not present

## 2024-08-25 DIAGNOSIS — E119 Type 2 diabetes mellitus without complications: Secondary | ICD-10-CM

## 2024-08-25 DIAGNOSIS — I10 Essential (primary) hypertension: Secondary | ICD-10-CM

## 2024-08-25 DIAGNOSIS — R0981 Nasal congestion: Secondary | ICD-10-CM

## 2024-08-25 DIAGNOSIS — J3089 Other allergic rhinitis: Secondary | ICD-10-CM

## 2024-08-25 DIAGNOSIS — G4709 Other insomnia: Secondary | ICD-10-CM

## 2024-08-25 MED ORDER — CETIRIZINE-PSEUDOEPHEDRINE ER 5-120 MG PO TB12: 1.0000 | ORAL_TABLET | Freq: Two times a day (BID) | ORAL | 3 refills | Status: DC

## 2024-08-25 MED ORDER — CETIRIZINE-PSEUDOEPHEDRINE ER 5-120 MG PO TB12: 1.0000 | ORAL_TABLET | Freq: Two times a day (BID) | ORAL | 3 refills | Status: AC

## 2024-08-25 NOTE — Progress Notes (Addendum)
 Subjective:  Patient ID: Jacob Gordon, male    DOB: 11-06-1955  Age: 68 y.o. MRN: [id number]  CC: Medical Management of Chronic Issues (4 week follow up for nasal congestion, diabetes, and hypertension. Does still have some intermittent days of congestion but has gotten better)   HPI Jacob Gordon presents for for nasal congestion on Rx, diabetes, and hypertension. Does still have some intermittent days of congestion but has gotten better F/u DM, HTN    Outpatient Medications Prior to Visit  Medication Sig Dispense Refill   amLODipine  (NORVASC ) 10 MG tablet TAKE 1 TABLET BY MOUTH EVERY DAY. 90 tablet 3   aspirin  EC 81 MG tablet Take 81 mg by mouth daily.     atorvastatin  (LIPITOR) 80 MG tablet Take 1 tablet (80 mg total) by mouth daily. 90 tablet 3   azelastine  (ASTELIN ) 0.1 % nasal spray Place 2 sprays into both nostrils 2 (two) times daily. Use in each nostril as directed 30 mL 5   carvedilol  (COREG ) 12.5 MG tablet Take 1 tablet (12.5 mg total) by mouth 2 (two) times daily with a meal. 180 tablet 3   Cholecalciferol (VITAMIN D -3) 125 MCG (5000 UT) TABS Take 5,000 Units by mouth daily.     HYDROcodone -acetaminophen  (NORCO) 5-325 MG tablet Take 1 tablet by mouth every 6 (six) hours as needed for severe pain (pain score 7-10) (Renal colic). 20 tablet 0   metaxalone (SKELAXIN) 800 MG tablet Take 800 mg by mouth 3 (three) times daily.     mometasone  (NASONEX ) 50 MCG/ACT nasal spray Place 2 sprays into the nose daily. 17 g 2   montelukast  (SINGULAIR ) 10 MG tablet Take 1 tablet (10 mg total) by mouth at bedtime. 90 tablet 3   OVER THE COUNTER MEDICATION Take 1 capsule by mouth daily. Suprema Dophilus Supplement - probiotic     pantoprazole  (PROTONIX ) 40 MG tablet TAKE 1 TABLET BY MOUTH DAILY 30 tablet 11   repaglinide  (PRANDIN ) 1 MG tablet Take 1 tablet (1 mg total) by mouth 3 (three) times daily before meals. 270 tablet 3   Semaglutide , 2 MG/DOSE, 8 MG/3ML SOPN Inject 2 mg as  directed once a week. 9 mL 3   SYRINGE-NEEDLE, DISP, 3 ML (B-D SYRINGE/NEEDLE 3CC/22GX1.5) 22G X 1-1/2 3 ML MISC Use as directed IM 50 each 1   tadalafil  (CIALIS ) 5 MG tablet TAKE 1 TABLET BY MOUTH DAILY 90 tablet 1   tamsulosin  (FLOMAX ) 0.4 MG CAPS capsule Take 1 capsule (0.4 mg total) by mouth daily as needed. Renal colic 90 capsule 1   testosterone  cypionate (DEPOTESTOSTERONE CYPIONATE) 200 MG/ML injection Inject 1 mL (200 mg total) into the muscle every 14 (fourteen) days. 10 mL 5   tretinoin (RETIN-A) 0.05 % cream 1 application in the evening to face Externally Pea size amount to whole face at night; Duration: 30 days     Ubrogepant  (UBRELVY ) 50 MG TABS Take 50mg  by mouth every 2 hours as needed for migraines   Maximum 100 mg daily 16 tablet 11   zolpidem  (AMBIEN ) 10 MG tablet Take 1 tablet (10 mg total) by mouth at bedtime as needed for sleep. 30 tablet 3   cetirizine  (ZYRTEC ) 10 MG tablet Take 1 tablet (10 mg total) by mouth daily. 100 tablet 3   methylPREDNISolone  (MEDROL  DOSEPAK) 4 MG TBPK tablet As directed 21 tablet 0   pseudoephedrine  (SUDAFED) 120 MG 12 hr tablet Take 1 tablet (120 mg total) by mouth 2 (two) times daily as needed  for congestion. 60 tablet 1   diazepam  (VALIUM ) 5 MG tablet Take 0.5-1 tablets (2.5-5 mg total) by mouth 2 (two) times daily as needed for anxiety. (Patient not taking: Reported on 08/25/2024) 40 tablet 1   ezetimibe  (ZETIA ) 10 MG tablet Take 1 tablet (10 mg total) by mouth daily. (Patient not taking: Reported on 08/25/2024) 30 tablet 3   Galcanezumab -gnlm (EMGALITY ) 120 MG/ML SOAJ Inject 120 mg into the skin every 30 (thirty) days. (Patient not taking: Reported on 08/25/2024) 1.12 mL 11   No facility-administered medications prior to visit.    ROS: Review of Systems  Constitutional:  Negative for appetite change, fatigue and unexpected weight change.  HENT:  Positive for congestion. Negative for nosebleeds, sneezing, sore throat and trouble swallowing.    Eyes:  Negative for itching and visual disturbance.  Respiratory:  Negative for cough.   Cardiovascular:  Negative for chest pain, palpitations and leg swelling.  Gastrointestinal:  Negative for abdominal distention, blood in stool, diarrhea and nausea.  Genitourinary:  Negative for frequency and hematuria.  Musculoskeletal:  Positive for arthralgias and gait problem. Negative for back pain, joint swelling and neck pain.  Skin:  Negative for rash.  Neurological:  Negative for dizziness, tremors, speech difficulty and weakness.  Psychiatric/Behavioral:  Negative for agitation, dysphoric mood and sleep disturbance. The patient is not nervous/anxious.     Objective:  BP 112/74   Pulse 69   Temp 98.4 F (36.9 C)   Ht 5' 9 (1.753 m)   Wt 191 lb (86.6 kg)   SpO2 97%   BMI 28.21 kg/m   BP Readings from Last 3 Encounters:  08/25/24 112/74  07/27/24 116/86  04/22/24 112/70    Wt Readings from Last 3 Encounters:  08/25/24 191 lb (86.6 kg)  07/27/24 190 lb 6.4 oz (86.4 kg)  04/22/24 187 lb (84.8 kg)    Physical Exam Constitutional:      General: He is not in acute distress.    Appearance: Normal appearance. He is well-developed.     Comments: NAD  Eyes:     Conjunctiva/sclera: Conjunctivae normal.     Pupils: Pupils are equal, round, and reactive to light.  Neck:     Thyroid : No thyromegaly.     Vascular: No JVD.  Cardiovascular:     Rate and Rhythm: Normal rate and regular rhythm.     Heart sounds: Normal heart sounds. No murmur heard.    No friction rub. No gallop.  Pulmonary:     Effort: Pulmonary effort is normal. No respiratory distress.     Breath sounds: Normal breath sounds. No wheezing or rales.  Chest:     Chest wall: No tenderness.  Abdominal:     General: Bowel sounds are normal. There is no distension.     Palpations: Abdomen is soft. There is no mass.     Tenderness: There is no abdominal tenderness. There is no guarding or rebound.  Musculoskeletal:         General: No tenderness. Normal range of motion.     Cervical back: Normal range of motion.  Lymphadenopathy:     Cervical: No cervical adenopathy.  Skin:    General: Skin is warm and dry.     Findings: No rash.  Neurological:     Mental Status: He is alert and oriented to person, place, and time.     Cranial Nerves: No cranial nerve deficit.     Motor: No abnormal muscle tone.  Coordination: Coordination normal.     Gait: Gait normal.     Deep Tendon Reflexes: Reflexes are normal and symmetric.  Psychiatric:        Behavior: Behavior normal.        Thought Content: Thought content normal.        Judgment: Judgment normal.     Lab Results  Component Value Date   WBC 5.8 08/24/2024   HGB 13.6 08/24/2024   HCT 42.9 08/24/2024   PLT 150.0 08/24/2024   GLUCOSE 111 (H) 08/24/2024   CHOL 92 08/24/2024   TRIG 31.0 08/24/2024   HDL 39.50 08/24/2024   LDLDIRECT 184.1 10/20/2012   LDLCALC 46 08/24/2024   ALT 25 08/24/2024   AST 19 08/24/2024   NA 140 08/24/2024   K 4.2 08/24/2024   CL 104 08/24/2024   CREATININE 1.32 08/24/2024   BUN 13 08/24/2024   CO2 30 08/24/2024   TSH 1.40 08/24/2024   PSA 1.68 08/24/2024   INR 1.2 07/22/2019   HGBA1C 6.6 (H) 08/24/2024   MICROALBUR 1.5 08/24/2024    CT RENAL STONE STUDY Result Date: 12/27/2021 CLINICAL DATA:  Left renal colic. EXAM: CT ABDOMEN AND PELVIS WITHOUT CONTRAST TECHNIQUE: Multidetector CT imaging of the abdomen and pelvis was performed following the standard protocol without IV contrast. RADIATION DOSE REDUCTION: This exam was performed according to the departmental dose-optimization program which includes automated exposure control, adjustment of the mA and/or kV according to patient size and/or use of iterative reconstruction technique. COMPARISON:  CT with IV contrast 06/12/2015 FINDINGS: Lower chest: There is left lower lobe linear scarring or atelectasis. There are no lung base infiltrates. The cardiac size is  normal. Small chronic pericardial effusion is noted anteriorly and calcification in the distal LAD and right coronary arteries. Hepatobiliary: Several scattered hepatic cysts. Largest in the right lobe is 2.7 cm and 7.4 Hounsfield units. Largest in the left lobe is 2.3 cm in 7.3 Hounsfield units. Some were present previously and larger than previously and some are new. There are additional too small to characterize scattered hypodensities. Gallbladder and bile ducts are unremarkable. Pancreas: Unremarkable without contrast. Spleen: Unremarkable without contrast. Adrenals/Urinary Tract: No adrenal mass is seen. Small cyst again noted posteriorly in the upper pole left kidney. The renal cortex otherwise unremarkable without contrast. There are several scattered punctate nonobstructive caliceal stones in the upper to midpole right kidney, and scattered punctate up to 1-2 mm stones in the left renal collecting system. No hydronephrosis or ureteral stones are seen although please note the distal right ureter below the level of the acetabulum is obscured by metal artifact from a right hip arthroplasty. Bladder wall is thickened which was seen previously, but is not fully distended. Stomach/Bowel: No dilatation or wall thickening including the appendix. Moderate fecal stasis. There are colonic diverticula without evidence of colitis or diverticulitis. Vascular/Lymphatic: There is moderate aortoiliac calcific plaque. There is no AAA but there is ectasia in the common iliac arteries, which measure 1.7 cm on the right and 1.4 cm on the left. Scattered calcific plaque in the branch arteries. Reproductive: Enlarged prostate, measuring 4.5 cm with bladder impression. Other: Small umbilical fat hernia. No incarcerated hernia. No free air, hemorrhage or fluid. Musculoskeletal: There are chronic L5 pars defects without L5-S1 spondylolisthesis. No acute or significant osseous findings. Right hip arthroplasty. Left hip DJD.  IMPRESSION: 1. There is nonobstructive micronephrolithiasis. No hydronephrosis or ureteral stone is seen, but with the most distal right ureter obscured by his right hip  arthroplasty. 2. Thickened bladder which could be due to nondistention, hypertrophy or cystitis. 3. Prostatomegaly. 4. Aortic and coronary artery atherosclerosis. 5. Hepatic cysts and additional too small to characterize hypodensities. 6. Constipation with diverticulosis. 7. Umbilical fat hernia. Electronically Signed   By: Francis Quam M.D.   On: 12/27/2021 03:20    Assessment & Plan:   Problem List Items Addressed This Visit     Allergic rhinitis - Primary   Better on Zyrtec  10 mg daily and.  Astelin  2 sprays twice daily, Nasonex  2 sprays once daily, Sudafed 12-hour twice daily as needed, Medrol  pack.  Start Singulair  10 mg daily No signs of infection  Change to Zyrtec  D bid Will ref to see Dr Maurilio      Relevant Orders   Ambulatory referral to Allergy   Diabetes mellitus type 2, controlled (HCC)   On Ozempic       Relevant Orders   Basic metabolic panel with GFR   Hemoglobin A1c   HTN (hypertension)   Blood pressure is good.  He should be able to tolerate Sudafed as needed      Insomnia   Start Zolpidem  prn Better      Nasal congestion   Better           Meds ordered this encounter  Medications   DISCONTD: cetirizine -pseudoephedrine  (ZYRTEC -D ALLERGY & SINUS) 5-120 MG tablet    Sig: Take 1 tablet by mouth 2 (two) times daily.    Dispense:  180 tablet    Refill:  3   cetirizine -pseudoephedrine  (ZYRTEC -D ALLERGY & SINUS) 5-120 MG tablet    Sig: Take 1 tablet by mouth 2 (two) times daily.    Dispense:  180 tablet    Refill:  3      Follow-up: Return in about 4 months (around 12/26/2024) for a follow-up visit.  Marolyn Noel, MD

## 2024-08-25 NOTE — Assessment & Plan Note (Addendum)
 Better on Zyrtec  10 mg daily and.  Astelin  2 sprays twice daily, Nasonex  2 sprays once daily, Sudafed 12-hour twice daily as needed, Medrol  pack.  Start Singulair  10 mg daily No signs of infection  Change to Zyrtec  D bid Will ref to see Dr Kozlow

## 2024-08-25 NOTE — Assessment & Plan Note (Addendum)
On Ozempic

## 2024-08-25 NOTE — Assessment & Plan Note (Signed)
 Blood pressure is good.  He should be able to tolerate Sudafed as needed

## 2024-08-25 NOTE — Assessment & Plan Note (Signed)
 Start Zolpidem  prn Better

## 2024-08-25 NOTE — Assessment & Plan Note (Signed)
 Better

## 2024-09-08 DIAGNOSIS — K080 Exfoliation of teeth due to systemic causes: Secondary | ICD-10-CM | POA: Diagnosis not present

## 2024-09-12 ENCOUNTER — Other Ambulatory Visit: Payer: Self-pay | Admitting: Internal Medicine

## 2024-09-16 DIAGNOSIS — K080 Exfoliation of teeth due to systemic causes: Secondary | ICD-10-CM | POA: Diagnosis not present

## 2024-09-27 ENCOUNTER — Encounter: Payer: Self-pay | Admitting: Internal Medicine

## 2024-09-29 ENCOUNTER — Other Ambulatory Visit: Payer: Self-pay | Admitting: Internal Medicine

## 2024-09-29 MED ORDER — AZELASTINE HCL 0.1 % NA SOLN: 2.0000 | Freq: Two times a day (BID) | NASAL | 5 refills | Status: AC

## 2024-09-29 NOTE — Progress Notes (Signed)
 Astelin -needs a larger volume per month

## 2024-10-05 ENCOUNTER — Encounter: Payer: Self-pay | Admitting: Allergy and Immunology

## 2024-10-05 ENCOUNTER — Other Ambulatory Visit: Payer: Self-pay

## 2024-10-05 ENCOUNTER — Ambulatory Visit: Admitting: Allergy and Immunology

## 2024-10-05 VITALS — BP 128/86 | HR 88 | Temp 98.7°F | Resp 16 | Ht 69.0 in | Wt 189.9 lb

## 2024-10-05 DIAGNOSIS — J3089 Other allergic rhinitis: Secondary | ICD-10-CM

## 2024-10-05 DIAGNOSIS — K219 Gastro-esophageal reflux disease without esophagitis: Secondary | ICD-10-CM | POA: Diagnosis not present

## 2024-10-05 MED ORDER — ESOMEPRAZOLE MAGNESIUM 40 MG PO CPDR: 40.0000 mg | DELAYED_RELEASE_CAPSULE | Freq: Two times a day (BID) | ORAL | 5 refills | Status: DC

## 2024-10-05 MED ORDER — MONTELUKAST SODIUM 10 MG PO TABS: 10.0000 mg | ORAL_TABLET | Freq: Every day | ORAL | 3 refills | Status: AC

## 2024-10-05 MED ORDER — RYALTRIS 665-25 MCG/ACT NA SUSP: 2.0000 | Freq: Two times a day (BID) | NASAL | 1 refills | Status: DC

## 2024-10-05 NOTE — Patient Instructions (Addendum)
  1.  Return to clinic for skin testing without antihistamine use  2.  Treat and prevent inflammation of airway:   A. Ryaltris - 2 sprays each nostril 2 times per day  B. Does montelukast  help? If not, discontinue  3.  Treat and prevent reflux induced inflammation of airway:   A.  Replace all throat clearing with swallowing/drinking maneuver  B.  Increase Nexium  40 mg -1 tablet twice a day  4.  If needed:   A. Nasal saline  B. OTC antihistamines   5. Immunotherapy???  6. Influenza = Tamiflu . Covid = Paxlovid

## 2024-10-05 NOTE — Progress Notes (Unsigned)
 [place] - [place] - [place] - [place]   Dear Jacob Gordon,  Thank you for referring Jacob Gordon to the [facility] [place]  on 10/05/2024.   Below is a summation of this patient's evaluation and recommendations.  Thank you for your referral. I will keep you informed about this patient's response to treatment.   If you have any questions please do not hesitate to contact me.   Sincerely,  Jacob DOROTHA Denis, MD Allergy / Immunology [facility] [place]    ______________________________________________________________________    NEW PATIENT NOTE  Referring Provider: Garald Karlynn GAILS, MD Primary Provider: Garald Karlynn GAILS, MD Date of office visit: 10/05/2024    Subjective:   Chief Complaint:  Jacob Gordon (DOB: Apr 08, 1956) is a 68 y.o. male who presents to the clinic on 10/05/2024 with a chief complaint of Establish Care (He states he has nasal congestion and sinus infection. ) .     HPI: Jacob Gordon presents to this clinic in evaluation of congestion.  He has a lifelong history of having nasal congestion along with some occasional sneezing and this is especially a problem in the evening and especially after he lays down at nighttime.  He is also had some decreased ability to smell which has been an issue for many many years.  While utilizing nasal steroids and nasal antihistamines and montelukast  and Zyrtec -D this does appear to help his symptoms somewhat.  There is no obvious provoking factor giving rise to this issue.  He has constant throat clearing and a tickle in his throat and fluid in his throat and intermittently raspy voice.  He has heartburn with regurgitation that he believes is under pretty good control while using Nexium  once a day.  He does not really consume caffeine or chocolate or alcohol.  He has a history of migraine headache which has been inactive for the  past 2 years.  He now has a dull headache located around his eyes and nasal area about twice a week that may last a few hours unassociated with scotoma or dizziness or other neurological symptoms that he does not treat.  Past Medical History:  Diagnosis Date   ALLERGIC RHINITIS    Chronic headaches    migraines   Complication of anesthesia    violent vomiting    Coronary artery disease    non obstructive   DEGENERATIVE DISC DISEASE    DIABETES MELLITUS, TYPE II    type 2   GERD    History of kidney stones    HLD (hyperlipidemia)    HYPERTENSION    Internal hemorrhoids    OSTEOARTHRITIS    PONV (postoperative nausea and vomiting)    Sleep apnea    no cpap mild case   THROMBOCYTOPENIA     Past Surgical History:  Procedure Laterality Date   COLONOSCOPY  10/01/11   internal hemorrhoids   LEFT HEART CATH AND CORONARY ANGIOGRAPHY N/A 01/07/2018   Procedure: LEFT HEART CATH AND CORONARY ANGIOGRAPHY;  Surgeon: Jacob Lonni BIRCH, MD;  [facility];  Service: Cardiovascular;  Laterality: N/A;   SHOULDER SURGERY     right rotator cuff surg   TONSILLECTOMY AND ADENOIDECTOMY     TOTAL HIP ARTHROPLASTY Right 07/28/2019   Procedure: TOTAL HIP ARTHROPLASTY ANTERIOR APPROACH;  Surgeon: Jacob Lerner, MD;  Location: [facility];  Service: Orthopedics;  Laterality: Right;    VASECTOMY      Allergies  as of 10/05/2024       Reactions   Tape Other (See Comments)   ADHESIVE - blistered   Amlodipine  Nausea Only   Metformin  And Related    Upset stomach   Sulfa Antibiotics Itching   Valsartan Other (See Comments)   Not sure - possibly caused stomach upset.   Pravastatin Sodium Nausea Only        Medication List    amLODipine  10 MG tablet Commonly known as: NORVASC  TAKE 1 TABLET BY MOUTH EVERY DAY.   aspirin  EC 81 MG tablet Take 81 mg by mouth daily.   atorvastatin  80 MG tablet Commonly known as: LIPITOR Take 1 tablet (80 mg total) by mouth daily.    azelastine  0.1 % nasal spray Commonly known as: ASTELIN  Place 2 sprays into both nostrils 2 (two) times daily. Use in each nostril as directed   B-D SYRINGE/NEEDLE 3CC/22GX1.5 22G X 1-1/2 3 ML Misc Generic drug: SYRINGE-NEEDLE (DISP) 3 ML Use as directed IM   carvedilol  12.5 MG tablet Commonly known as: COREG  Take 1 tablet (12.5 mg total) by mouth 2 (two) times daily with a meal.   cetirizine -pseudoephedrine  5-120 MG tablet Commonly known as: ZyrTEC -D Allergy & Sinus Take 1 tablet by mouth 2 (two) times daily.   diazepam  5 MG tablet Commonly known as: VALIUM  Take 0.5-1 tablets (2.5-5 mg total) by mouth 2 (two) times daily as needed for anxiety.   Emgality  120 MG/ML Soaj Generic drug: Galcanezumab -gnlm Inject 120 mg into the skin every 30 (thirty) days.   ezetimibe  10 MG tablet Commonly known as: Zetia  Take 1 tablet (10 mg total) by mouth daily.   HYDROcodone -acetaminophen  5-325 MG tablet Commonly known as: Norco Take 1 tablet by mouth every 6 (six) hours as needed for severe pain (pain score 7-10) (Renal colic).   icosapent  Ethyl 1 g capsule Commonly known as: VASCEPA  TAKE 2 CAPSULES BY MOUTH TWICE A DAY   metaxalone 800 MG tablet Commonly known as: SKELAXIN Take 800 mg by mouth 3 (three) times daily.   mometasone  50 MCG/ACT nasal spray Commonly known as: Nasonex  Place 2 sprays into the nose daily.   montelukast  10 MG tablet Commonly known as: SINGULAIR  Take 1 tablet (10 mg total) by mouth at bedtime.   OVER THE COUNTER MEDICATION Take 1 capsule by mouth daily. Suprema Dophilus Supplement - probiotic   pantoprazole  40 MG tablet Commonly known as: PROTONIX  TAKE 1 TABLET BY MOUTH DAILY   repaglinide  1 MG tablet Commonly known as: PRANDIN  Take 1 tablet (1 mg total) by mouth 3 (three) times daily before meals.   Semaglutide  (2 MG/DOSE) 8 MG/3ML Sopn Inject 2 mg as directed once a week.   tadalafil  5 MG tablet Commonly known as: CIALIS  TAKE 1 TABLET BY  MOUTH DAILY   tamsulosin  0.4 MG Caps capsule Commonly known as: FLOMAX  Take 1 capsule (0.4 mg total) by mouth daily as needed. Renal colic   testosterone  cypionate 200 MG/ML injection Commonly known as: DEPOTESTOSTERONE CYPIONATE Inject 1 mL (200 mg total) into the muscle every 14 (fourteen) days.   tretinoin 0.05 % cream Commonly known as: RETIN-A 1 application in the evening to face Externally Pea size amount to whole face at night; Duration: 30 days   Ubrelvy  50 MG Tabs Generic drug: Ubrogepant  Take 50mg  by mouth every 2 hours as needed for migraines   Maximum 100 mg daily   Vitamin D -3 125 MCG (5000 UT) Tabs Take 5,000 Units by mouth daily.   zolpidem  10 MG  tablet Commonly known as: AMBIEN  Take 1 tablet (10 mg total) by mouth at bedtime as needed for sleep.    Review of systems negative except as noted in HPI / PMHx or noted below:  Review of Systems  Constitutional: Negative.   HENT: Negative.    Eyes: Negative.   Respiratory: Negative.    Cardiovascular: Negative.   Gastrointestinal: Negative.   Genitourinary: Negative.   Musculoskeletal: Negative.   Skin: Negative.   Neurological: Negative.   Endo/Heme/Allergies: Negative.   Psychiatric/Behavioral: Negative.      Family History  Problem Relation Age of Onset   Diabetes Sister    Colon cancer Neg Hx     Social History   Socioeconomic History   Marital status: Married    Spouse name: Rebacca   Number of children: 1   Years of education: Not on file   Highest education level: Some college, no degree  Occupational History   Occupation: Designer, Industrial/product /Part time  Tobacco Use   Smoking status: Never   Smokeless tobacco: Never  Vaping Use   Vaping status: Never Used  Substance and Sexual Activity   Alcohol use: No   Drug use: No   Sexual activity: Yes  Other Topics Concern   Not on file  Social History Narrative   Caffeine daily       Lives with wife   Social Drivers of Health   Financial  Resource Strain: Low Risk  (08/22/2024)   Overall Financial Resource Strain (CARDIA)    Difficulty of Paying Living Expenses: Not hard at all  Food Insecurity: No Food Insecurity (08/22/2024)   Hunger Vital Sign    Worried About Running Out of Food in the Last Year: Never true    Ran Out of Food in the Last Year: Never true  Transportation Needs: Unknown (08/22/2024)   PRAPARE - Administrator, Civil Service (Medical): No    Lack of Transportation (Non-Medical): Not on file  Physical Activity: Inactive (08/22/2024)   Exercise Vital Sign    Days of Exercise per Week: 0 days    Minutes of Exercise per Session: Not on file  Stress: Patient Declined (08/22/2024)   [employer] of Occupational Health - Occupational Stress Questionnaire    Feeling of Stress: Patient declined  Social Connections: Unknown (08/22/2024)   Social Connection and Isolation Panel    Frequency of Communication with Friends and Family: Once a week    Frequency of Social Gatherings with Friends and Family: Patient declined    Attends Religious Services: Not on Insurance Claims Handler of Clubs or Organizations: No    Attends Banker Meetings: Not on file    Marital Status: Married  Intimate Partner Violence: Not At Risk (12/10/2023)   Humiliation, Afraid, Rape, and Kick questionnaire    Fear of Current or Ex-Partner: No    Emotionally Abused: No    Physically Abused: No    Sexually Abused: No    Environmental and Social history  Lives in a house with a dry environment, no animals located inside the household, no carpet in the bedroom, no plastic on the bed, no plastic on the pillow, no smoking ongoing inside the household.  Objective:   Vitals:   10/05/24 0959  BP: 128/86  Pulse: 88  Resp: 16  Temp: 98.7 F (37.1 C)  SpO2: 98%   Height: 5' 9 (175.3 cm) Weight: 189 lb 14.4 oz (86.1 kg)  Physical Exam Constitutional:  Appearance: He is not diaphoretic.  HENT:      Head: Normocephalic.     Right Ear: Tympanic membrane, ear canal and external ear normal.     Left Ear: Tympanic membrane, ear canal and external ear normal.     Nose: Nose normal. No mucosal edema or rhinorrhea.     Mouth/Throat:     Pharynx: Uvula midline. No oropharyngeal exudate.  Eyes:     Conjunctiva/sclera: Conjunctivae normal.  Neck:     Thyroid : No thyromegaly.     Trachea: Trachea normal. No tracheal tenderness or tracheal deviation.  Cardiovascular:     Rate and Rhythm: Normal rate and regular rhythm.     Heart sounds: Normal heart sounds, S1 normal and S2 normal. No murmur heard. Pulmonary:     Effort: No respiratory distress.     Breath sounds: Normal breath sounds. No stridor. No wheezing or rales.  Lymphadenopathy:     Head:     Right side of head: No tonsillar adenopathy.     Left side of head: No tonsillar adenopathy.     Cervical: No cervical adenopathy.  Skin:    Findings: No erythema or rash.     Nails: There is no clubbing.  Neurological:     Mental Status: He is alert.     Diagnostics: Allergy skin tests were performed.   Spirometry was performed and demonstrated an FEV1 of *** @ *** % of predicted. FEV1/FVC = ***  The patient had an Asthma Control Test with the following results:  .     Assessment and Plan:    No diagnosis found.  Patient Instructions   1.  Return to clinic for skin testing without antihistamine use  2.  Treat and prevent inflammation of airway:   A. Ryaltris - 2 sprays each nostril 2 times per day  B. Does montelukast  help? If not, discontinue  3.  Treat and prevent reflux induced inflammation of airway:   A.  Replace all throat clearing with swallowing/drinking maneuver  B.  Increase Nexium  40 mg -1 tablet twice a day  4.  If needed:   A. Nasal saline  B. OTC antihistamines   5. Immunotherapy???  6. Influenza = Tamiflu . Covid = Paxlovid     Jacob DOROTHA Denis, MD Allergy / Immunology [facility] [place] 

## 2024-10-06 ENCOUNTER — Encounter: Payer: Self-pay | Admitting: Allergy and Immunology

## 2024-10-19 ENCOUNTER — Ambulatory Visit: Admitting: Allergy and Immunology

## 2024-10-19 ENCOUNTER — Encounter: Payer: Self-pay | Admitting: Allergy and Immunology

## 2024-10-19 DIAGNOSIS — J3089 Other allergic rhinitis: Secondary | ICD-10-CM | POA: Diagnosis not present

## 2024-10-20 NOTE — Progress Notes (Signed)
 Jacob Gordon presents to this clinic for skin testing.  Allergy  skin testing identified hypersensitivity gets house dust mite.  Allergen avoidance measures were provided.

## 2024-10-25 ENCOUNTER — Ambulatory Visit: Admitting: Internal Medicine

## 2024-10-31 ENCOUNTER — Encounter: Payer: Self-pay | Admitting: Internal Medicine

## 2024-11-16 ENCOUNTER — Other Ambulatory Visit: Payer: Self-pay

## 2024-11-16 ENCOUNTER — Ambulatory Visit: Admitting: Allergy and Immunology

## 2024-11-16 VITALS — BP 116/72 | HR 78 | Temp 97.9°F | Resp 16 | Ht 69.0 in | Wt 195.3 lb

## 2024-11-16 DIAGNOSIS — J3089 Other allergic rhinitis: Secondary | ICD-10-CM | POA: Diagnosis not present

## 2024-11-16 DIAGNOSIS — K219 Gastro-esophageal reflux disease without esophagitis: Secondary | ICD-10-CM

## 2024-11-16 DIAGNOSIS — H6992 Unspecified Eustachian tube disorder, left ear: Secondary | ICD-10-CM | POA: Diagnosis not present

## 2024-11-16 MED ORDER — ESOMEPRAZOLE MAGNESIUM 40 MG PO CPDR: 40.0000 mg | DELAYED_RELEASE_CAPSULE | Freq: Two times a day (BID) | ORAL | 5 refills | Status: AC

## 2024-11-16 MED ORDER — FAMOTIDINE 40 MG PO TABS: 40.0000 mg | ORAL_TABLET | Freq: Every day | ORAL | 5 refills | Status: AC

## 2024-11-16 MED ORDER — JATENZO 237 MG PO CAPS: 237.0000 mg | ORAL_CAPSULE | Freq: Two times a day (BID) | ORAL | 5 refills | Status: AC

## 2024-11-16 MED ORDER — RYALTRIS 665-25 MCG/ACT NA SUSP: 2.0000 | Freq: Two times a day (BID) | NASAL | 1 refills | Status: AC

## 2024-11-16 NOTE — Progress Notes (Unsigned)
 "  Jacob Gordon - Jacob Gordon Jacob Gordon - [place] - Jacob Gordon - Jacob Gordon   Follow-up Note  Referring Provider: Garald Karlynn GAILS, MD Primary Provider: Garald Karlynn GAILS, MD Date of Office Visit: 11/16/2024  Subjective:   Jacob Gordon (DOB: [birth year]) is a 69 y.o. male who [facility] on 11/16/2024 in re-evaluation of the following:  HPI: Jacob Gordon returns to this clinic in evaluation of allergic rhinitis and LPR.  I last saw him in this clinic 19 October 2024.  His big complaint is the fact that he has drainage in his throat.  That is still an issue.  He is very good about not throat clearing anymore.  He has been very good about performing dust mite avoidance measures and using his nasal steroid/antihistamine combination spray.  He is currently using his Nexium  twice a day.  And he has had some issues with his left ear feeling as though it is stuffy.  This has been an intermittent issue for a while.  Allergies as of 11/16/2024       Reactions   Tape Other (See Comments)   ADHESIVE - blistered   Amlodipine  Nausea Only   Metformin  And Related    Upset stomach   Sulfa Antibiotics Itching   Valsartan Other (See Comments)   Not sure - possibly caused stomach upset.   Pravastatin Sodium Nausea Only        Medication List    amLODipine  10 MG tablet Commonly known as: NORVASC  TAKE 1 TABLET BY MOUTH EVERY DAY.   aspirin  EC 81 MG tablet Take 81 mg by mouth daily.   atorvastatin  80 MG tablet Commonly known as: LIPITOR Take 1 tablet (80 mg total) by mouth daily.   azelastine  0.1 % nasal spray Commonly known as: ASTELIN  Place 2 sprays into both nostrils 2 (two) times daily. Use in each nostril as directed   B-D SYRINGE/NEEDLE 3CC/22GX1.5 22G X 1-1/2 3 ML Misc Generic drug: SYRINGE-NEEDLE (DISP) 3 ML Use as directed IM   carvedilol  12.5 MG tablet Commonly known as: COREG  Take 1 tablet (12.5 mg total) by mouth 2 (two) times daily with a  meal.   cetirizine -pseudoephedrine  5-120 MG tablet Commonly known as: ZyrTEC -D Allergy  & Sinus Take 1 tablet by mouth 2 (two) times daily.   diazepam  5 MG tablet Commonly known as: VALIUM  Take 0.5-1 tablets (2.5-5 mg total) by mouth 2 (two) times daily as needed for anxiety.   Emgality  120 MG/ML Soaj Generic drug: Galcanezumab -gnlm Inject 120 mg into the skin every 30 (thirty) days.   esomeprazole  40 MG capsule Commonly known as: NexIUM  Take 1 capsule (40 mg total) by mouth in the morning and at bedtime.   ezetimibe  10 MG tablet Commonly known as: Zetia  Take 1 tablet (10 mg total) by mouth daily.   HYDROcodone -acetaminophen  5-325 MG tablet Commonly known as: Norco Take 1 tablet by mouth every 6 (six) hours as needed for severe pain (pain score 7-10) (Renal colic).   icosapent  Ethyl 1 g capsule Commonly known as: VASCEPA  TAKE 2 CAPSULES BY MOUTH TWICE A DAY   Jatenzo  237 MG Caps Generic drug: Testosterone  Undecanoate Take 1 capsule (237 mg total) by mouth 2 (two) times daily.   metaxalone 800 MG tablet Commonly known as: SKELAXIN Take 800 mg by mouth 3 (three) times daily.   montelukast  10 MG tablet Commonly known as: SINGULAIR  Take 1 tablet (10 mg total) by mouth at bedtime.   OVER THE COUNTER MEDICATION Take 1 capsule by  mouth daily. Suprema Dophilus Supplement - probiotic   pantoprazole  40 MG tablet Commonly known as: PROTONIX  TAKE 1 TABLET BY MOUTH DAILY   repaglinide  1 MG tablet Commonly known as: PRANDIN  Take 1 tablet (1 mg total) by mouth 3 (three) times daily before meals.   Ryaltris  665-25 MCG/ACT Susp Generic drug: Olopatadine-Mometasone  Place 2 sprays into the nose in the morning and at bedtime.   Semaglutide  (2 MG/DOSE) 8 MG/3ML Sopn Inject 2 mg as directed once a week.   tadalafil  5 MG tablet Commonly known as: CIALIS  TAKE 1 TABLET BY MOUTH DAILY   tamsulosin  0.4 MG Caps capsule Commonly known as: FLOMAX  Take 1 capsule (0.4 mg total) by  mouth daily as needed. Renal colic   tretinoin 0.05 % cream Commonly known as: RETIN-A 1 application in the evening to face Externally Pea size amount to whole face at night; Duration: 30 days   Ubrelvy  50 MG Tabs Generic drug: Ubrogepant  Take 50mg  by mouth every 2 hours as needed for migraines   Maximum 100 mg daily   Vitamin D -3 125 MCG (5000 UT) Tabs Take 5,000 Units by mouth daily.   zolpidem  10 MG tablet Commonly known as: AMBIEN  Take 1 tablet (10 mg total) by mouth at bedtime as needed for sleep.      Past Medical History:  Diagnosis Date   ALLERGIC RHINITIS    Chronic headaches    migraines   Complication of anesthesia    violent vomiting    Coronary artery disease    non obstructive   DEGENERATIVE DISC DISEASE    DIABETES MELLITUS, TYPE II    type 2   GERD    History of kidney stones    HLD (hyperlipidemia)    HYPERTENSION    Internal hemorrhoids    OSTEOARTHRITIS    PONV (postoperative nausea and vomiting)    Sleep apnea    no cpap mild case   THROMBOCYTOPENIA     Past Surgical History:  Procedure Laterality Date   COLONOSCOPY  10/01/11   internal hemorrhoids   LEFT HEART CATH AND CORONARY ANGIOGRAPHY N/A 01/07/2018   Procedure: LEFT HEART CATH AND CORONARY ANGIOGRAPHY;  Surgeon: Jacob Lonni BIRCH, MD;  [facility];  Service: Cardiovascular;  Laterality: N/A;   SHOULDER SURGERY     right rotator cuff surg   TONSILLECTOMY AND ADENOIDECTOMY     TOTAL HIP ARTHROPLASTY Right 07/28/2019   Procedure: TOTAL HIP ARTHROPLASTY ANTERIOR APPROACH;  Surgeon: Jacob Lerner, MD;  Location: [facility];  Service: Orthopedics;  Laterality: Right;    VASECTOMY      Review of systems negative except as noted in HPI / PMHx or noted below:  Review of Systems  Constitutional: Negative.   HENT: Negative.    Eyes: Negative.   Respiratory: Negative.    Cardiovascular: Negative.   Gastrointestinal: Negative.   Genitourinary: Negative.    Musculoskeletal: Negative.   Skin: Negative.   Neurological: Negative.   Endo/Heme/Allergies: Negative.   Psychiatric/Behavioral: Negative.       Objective:   Vitals:   11/16/24 1130  BP: 116/72  Pulse: 78  Resp: 16  Temp: 97.9 F (36.6 C)  SpO2: 97%   Height: 5' 9 (175.3 cm)  Weight: 195 lb 4.8 oz (88.6 kg)   Physical Exam Constitutional:      Appearance: He is not diaphoretic.  HENT:     Head: Normocephalic.     Right Ear: Tympanic membrane, ear canal and external ear normal.  Left Ear: Ear canal and external ear normal. Tympanic membrane is retracted.     Nose: Nose normal. No mucosal edema or rhinorrhea.     Mouth/Throat:     Pharynx: Uvula midline. No oropharyngeal exudate.  Eyes:     Conjunctiva/sclera: Conjunctivae normal.  Neck:     Thyroid : No thyromegaly.     Trachea: Trachea normal. No tracheal tenderness or tracheal deviation.  Cardiovascular:     Rate and Rhythm: Normal rate and regular rhythm.     Heart sounds: Normal heart sounds, S1 normal and S2 normal. No murmur heard. Pulmonary:     Effort: No respiratory distress.     Breath sounds: Normal breath sounds. No stridor. No wheezing or rales.  Lymphadenopathy:     Head:     Right side of head: No tonsillar adenopathy.     Left side of head: No tonsillar adenopathy.     Cervical: No cervical adenopathy.  Skin:    Findings: No erythema or rash.     Nails: There is no clubbing.  Neurological:     Mental Status: He is alert.     Diagnostics: none  Assessment and Plan:   1. Perennial allergic rhinitis   2. LPRD (laryngopharyngeal reflux disease)   3. ETD (Eustachian tube dysfunction), left    1.  Allergen avoidance measures - dust mite  2.  Treat and prevent inflammation of airway:   A. Ryaltris  - 2 sprays each nostril 2 times per day  B. Can continue montelukast  if you think it helps  3.  Treat and prevent reflux induced inflammation of airway:   A.  Replace all throat clearing  with swallowing/drinking maneuver  B.  Nexium  40 mg -1 tablet twice a day  C.  Start famotidine  40 mg - 1 tablet in evening  4.  If needed:   A. Nasal saline  B. OTC antihistamines   C. Positive pressure inflation of middle ear  5. Visit with ENT to examine throat and ears  6. Influenza = Tamiflu . Covid = Paxlovid  7. Return to clinic in 12 weeks or earlier if problem  Jacob Gordon appears to have issues with some inflammation of his throat and his eustachian tube and we are going to increase his therapy for LPR with the addition of famotidine  to his proton pump inhibitor and he can use positive pressure inflation of his eustachian tube to help with his middle ear issue and we will have him visit with ENT to examine both his throat and ears.  I will see him back in this clinic in 12 weeks or earlier if there is a problem.  Camellia Denis, MD Allergy  / Immunology [facility] "

## 2024-11-16 NOTE — Patient Instructions (Signed)
" °  1.  Allergen avoidance measures - dust mite  2.  Treat and prevent inflammation of airway:   A. Ryaltris  - 2 sprays each nostril 2 times per day  B. Can continue montelukast  if you think it helps  3.  Treat and prevent reflux induced inflammation of airway:   A.  Replace all throat clearing with swallowing/drinking maneuver  B.  Nexium  40 mg -1 tablet twice a day  C.  Start famotidine  40 mg - 1 tablet in evening  4.  If needed:   A. Nasal saline  B. OTC antihistamines   C. Positive pressure inflation of middle ear  5. Visit with ENT to examine throat and ears  6. Influenza = Tamiflu . Covid = Paxlovid  7. Return to clinic in 12 weeks or earlier if problem  "

## 2024-11-17 ENCOUNTER — Encounter: Payer: Self-pay | Admitting: Allergy and Immunology

## 2024-11-23 ENCOUNTER — Encounter: Payer: Self-pay | Admitting: *Deleted

## 2024-11-23 NOTE — Progress Notes (Signed)
 Jacob Gordon                                          MRN: [id number]   11/23/2024   The [facility] Quality Team Specialist reviewed this patient medical record for the purposes of chart review for care gap closure. The following were reviewed: abstraction for care gap closure-kidney health evaluation for diabetes:eGFR  and uACR.    [facility] Quality Team

## 2024-11-24 ENCOUNTER — Other Ambulatory Visit

## 2024-11-24 ENCOUNTER — Encounter: Payer: Self-pay | Admitting: Allergy and Immunology

## 2024-11-24 DIAGNOSIS — E119 Type 2 diabetes mellitus without complications: Secondary | ICD-10-CM

## 2024-11-24 LAB — BASIC METABOLIC PANEL WITH GFR
BUN: 12 mg/dL (ref 6–23)
CO2: 30 meq/L (ref 19–32)
Calcium: 9.2 mg/dL (ref 8.4–10.5)
Chloride: 102 meq/L (ref 96–112)
Creatinine, Ser: 1.27 mg/dL (ref 0.40–1.50)
GFR: 58.24 mL/min — ABNORMAL LOW
Glucose, Bld: 101 mg/dL — ABNORMAL HIGH (ref 70–99)
Potassium: 3.9 meq/L (ref 3.5–5.1)
Sodium: 138 meq/L (ref 135–145)

## 2024-11-24 LAB — HEMOGLOBIN A1C: Hgb A1c MFr Bld: 6.7 % — ABNORMAL HIGH (ref 4.6–6.5)

## 2024-11-25 ENCOUNTER — Telehealth: Payer: Self-pay | Admitting: Allergy and Immunology

## 2024-11-25 ENCOUNTER — Ambulatory Visit: Payer: Self-pay | Admitting: Internal Medicine

## 2024-11-25 NOTE — Telephone Encounter (Signed)
 Jacob Gordon is scheduled with Dr. Tobie at [facility] for 2/19 at 11:00 am

## 2024-12-01 ENCOUNTER — Ambulatory Visit: Admitting: Internal Medicine

## 2024-12-01 ENCOUNTER — Encounter: Payer: Self-pay | Admitting: Internal Medicine

## 2024-12-01 VITALS — BP 134/80 | HR 85 | Temp 98.4°F | Ht 69.0 in | Wt 195.0 lb

## 2024-12-01 DIAGNOSIS — E291 Testicular hypofunction: Secondary | ICD-10-CM

## 2024-12-01 DIAGNOSIS — Z7985 Long-term (current) use of injectable non-insulin antidiabetic drugs: Secondary | ICD-10-CM | POA: Diagnosis not present

## 2024-12-01 DIAGNOSIS — R61 Generalized hyperhidrosis: Secondary | ICD-10-CM

## 2024-12-01 DIAGNOSIS — I10 Essential (primary) hypertension: Secondary | ICD-10-CM | POA: Diagnosis not present

## 2024-12-01 DIAGNOSIS — E119 Type 2 diabetes mellitus without complications: Secondary | ICD-10-CM | POA: Diagnosis not present

## 2024-12-01 NOTE — Assessment & Plan Note (Addendum)
 He stopped Testosterone  inj due to local pain. On Jatenzo  just started

## 2024-12-01 NOTE — Assessment & Plan Note (Signed)
 Blood pressure is good.  Continue on amlodipine , Coreg 

## 2024-12-01 NOTE — Assessment & Plan Note (Signed)
On Ozempic

## 2024-12-01 NOTE — Assessment & Plan Note (Signed)
 Resolved

## 2024-12-01 NOTE — Progress Notes (Signed)
 "  Subjective:  Patient ID: Jacob Gordon, male    DOB: 1955/12/28  Age: 69 y.o. MRN: [id number]  CC: Follow-up (Lab reults)   HPI Jacob Gordon presents for DM, rhinitis, HTN He stopped Testosterone  inj due to local pain. On Jatenzo  just started  Outpatient Medications Prior to Visit  Medication Sig Dispense Refill   amLODipine  (NORVASC ) 10 MG tablet TAKE 1 TABLET BY MOUTH EVERY DAY. 90 tablet 3   aspirin  EC 81 MG tablet Take 81 mg by mouth daily.     atorvastatin  (LIPITOR) 80 MG tablet Take 1 tablet (80 mg total) by mouth daily. 90 tablet 3   azelastine  (ASTELIN ) 0.1 % nasal spray Place 2 sprays into both nostrils 2 (two) times daily. Use in each nostril as directed 60 mL 5   carvedilol  (COREG ) 12.5 MG tablet Take 1 tablet (12.5 mg total) by mouth 2 (two) times daily with a meal. 180 tablet 3   cetirizine -pseudoephedrine  (ZYRTEC -D ALLERGY  & SINUS) 5-120 MG tablet Take 1 tablet by mouth 2 (two) times daily. 180 tablet 3   Cholecalciferol (VITAMIN D -3) 125 MCG (5000 UT) TABS Take 5,000 Units by mouth daily.     diazepam  (VALIUM ) 5 MG tablet Take 0.5-1 tablets (2.5-5 mg total) by mouth 2 (two) times daily as needed for anxiety. 40 tablet 1   esomeprazole  (NEXIUM ) 40 MG capsule Take 1 capsule (40 mg total) by mouth in the morning and at bedtime. 60 capsule 5   ezetimibe  (ZETIA ) 10 MG tablet Take 1 tablet (10 mg total) by mouth daily. 30 tablet 3   famotidine  (PEPCID ) 40 MG tablet Take 1 tablet (40 mg total) by mouth daily. 30 tablet 5   Galcanezumab -gnlm (EMGALITY ) 120 MG/ML SOAJ Inject 120 mg into the skin every 30 (thirty) days. 1.12 mL 11   HYDROcodone -acetaminophen  (NORCO) 5-325 MG tablet Take 1 tablet by mouth every 6 (six) hours as needed for severe pain (pain score 7-10) (Renal colic). 20 tablet 0   icosapent  Ethyl (VASCEPA ) 1 g capsule TAKE 2 CAPSULES BY MOUTH TWICE A DAY 360 capsule 1   metaxalone (SKELAXIN) 800 MG tablet Take 800 mg by mouth 3 (three) times daily.      mometasone  (NASONEX ) 50 MCG/ACT nasal spray Place 2 sprays into the nose daily. 17 g 2   montelukast  (SINGULAIR ) 10 MG tablet Take 1 tablet (10 mg total) by mouth at bedtime. 90 tablet 3   Olopatadine-Mometasone  (RYALTRIS ) 665-25 MCG/ACT SUSP Place 2 sprays into the nose in the morning and at bedtime. 87 g 1   OVER THE COUNTER MEDICATION Take 1 capsule by mouth daily. Suprema Dophilus Supplement - probiotic     pantoprazole  (PROTONIX ) 40 MG tablet TAKE 1 TABLET BY MOUTH DAILY 30 tablet 11   repaglinide  (PRANDIN ) 1 MG tablet Take 1 tablet (1 mg total) by mouth 3 (three) times daily before meals. 270 tablet 3   Semaglutide , 2 MG/DOSE, 8 MG/3ML SOPN Inject 2 mg as directed once a week. 9 mL 3   SYRINGE-NEEDLE, DISP, 3 ML (B-D SYRINGE/NEEDLE 3CC/22GX1.5) 22G X 1-1/2 3 ML MISC Use as directed IM 50 each 1   tadalafil  (CIALIS ) 5 MG tablet TAKE 1 TABLET BY MOUTH DAILY 90 tablet 1   tamsulosin  (FLOMAX ) 0.4 MG CAPS capsule Take 1 capsule (0.4 mg total) by mouth daily as needed. Renal colic 90 capsule 1   Testosterone  Undecanoate (JATENZO ) 237 MG CAPS Take 1 capsule (237 mg total) by mouth 2 (two) times daily. 60 capsule  5   tretinoin (RETIN-A) 0.05 % cream 1 application in the evening to face Externally Pea size amount to whole face at night; Duration: 30 days     Ubrogepant  (UBRELVY ) 50 MG TABS Take 50mg  by mouth every 2 hours as needed for migraines   Maximum 100 mg daily 16 tablet 11   zolpidem  (AMBIEN ) 10 MG tablet Take 1 tablet (10 mg total) by mouth at bedtime as needed for sleep. 30 tablet 3   No facility-administered medications prior to visit.    ROS: Review of Systems  Constitutional:  Negative for appetite change, fatigue and unexpected weight change.  HENT:  Negative for congestion, nosebleeds, sneezing, sore throat and trouble swallowing.   Eyes:  Negative for itching and visual disturbance.  Respiratory:  Negative for cough.   Cardiovascular:  Negative for chest pain, palpitations and  leg swelling.  Gastrointestinal:  Negative for abdominal distention, blood in stool, diarrhea and nausea.  Genitourinary:  Negative for frequency and hematuria.  Musculoskeletal:  Negative for back pain, gait problem, joint swelling and neck pain.  Skin:  Negative for rash.  Neurological:  Negative for dizziness, tremors, speech difficulty and weakness.  Psychiatric/Behavioral:  Negative for agitation, dysphoric mood, sleep disturbance and suicidal ideas. The patient is not nervous/anxious.     Objective:  BP 134/80   Pulse 85   Temp 98.4 F (36.9 C) (Oral)   Ht 5' 9 (1.753 m)   Wt 195 lb (88.5 kg)   SpO2 98%   BMI 28.80 kg/m   BP Readings from Last 3 Encounters:  12/01/24 134/80  11/16/24 116/72  10/05/24 128/86    Wt Readings from Last 3 Encounters:  12/01/24 195 lb (88.5 kg)  11/16/24 195 lb 4.8 oz (88.6 kg)  10/05/24 189 lb 14.4 oz (86.1 kg)    Physical Exam Constitutional:      General: He is not in acute distress.    Appearance: He is well-developed.     Comments: NAD  Eyes:     Conjunctiva/sclera: Conjunctivae normal.     Pupils: Pupils are equal, round, and reactive to light.  Neck:     Thyroid : No thyromegaly.     Vascular: No JVD.  Cardiovascular:     Rate and Rhythm: Normal rate and regular rhythm.     Heart sounds: Normal heart sounds. No murmur heard.    No friction rub. No gallop.  Pulmonary:     Effort: Pulmonary effort is normal. No respiratory distress.     Breath sounds: Normal breath sounds. No wheezing or rales.  Chest:     Chest wall: No tenderness.  Abdominal:     General: Bowel sounds are normal. There is no distension.     Palpations: Abdomen is soft. There is no mass.     Tenderness: There is no abdominal tenderness. There is no guarding or rebound.  Musculoskeletal:        General: No tenderness. Normal range of motion.     Cervical back: Normal range of motion.  Lymphadenopathy:     Cervical: No cervical adenopathy.  Skin:     General: Skin is warm and dry.     Findings: No rash.  Neurological:     Mental Status: He is alert and oriented to person, place, and time.     Cranial Nerves: No cranial nerve deficit.     Motor: No abnormal muscle tone.     Coordination: Coordination normal.     Gait: Gait normal.  Deep Tendon Reflexes: Reflexes are normal and symmetric.  Psychiatric:        Behavior: Behavior normal.        Thought Content: Thought content normal.        Judgment: Judgment normal.   Foot exam - nl B  Lab Results  Component Value Date   WBC 5.8 08/24/2024   HGB 13.6 08/24/2024   HCT 42.9 08/24/2024   PLT 150.0 08/24/2024   GLUCOSE 101 (H) 11/24/2024   CHOL 92 08/24/2024   TRIG 31.0 08/24/2024   HDL 39.50 08/24/2024   LDLDIRECT 184.1 10/20/2012   LDLCALC 46 08/24/2024   ALT 25 08/24/2024   AST 19 08/24/2024   NA 138 11/24/2024   K 3.9 11/24/2024   CL 102 11/24/2024   CREATININE 1.27 11/24/2024   BUN 12 11/24/2024   CO2 30 11/24/2024   TSH 1.40 08/24/2024   PSA 1.68 08/24/2024   INR 1.2 07/22/2019   HGBA1C 6.7 (H) 11/24/2024   MICROALBUR 1.5 08/24/2024    CT RENAL STONE STUDY Result Date: 12/27/2021 CLINICAL DATA:  Left renal colic. EXAM: CT ABDOMEN AND PELVIS WITHOUT CONTRAST TECHNIQUE: Multidetector CT imaging of the abdomen and pelvis was performed following the standard protocol without IV contrast. RADIATION DOSE REDUCTION: This exam was performed according to the departmental dose-optimization program which includes automated exposure control, adjustment of the mA and/or kV according to patient size and/or use of iterative reconstruction technique. COMPARISON:  CT with IV contrast 06/12/2015 FINDINGS: Lower chest: There is left lower lobe linear scarring or atelectasis. There are no lung base infiltrates. The cardiac size is normal. Small chronic pericardial effusion is noted anteriorly and calcification in the distal LAD and right coronary arteries. Hepatobiliary: Several  scattered hepatic cysts. Largest in the right lobe is 2.7 cm and 7.4 Hounsfield units. Largest in the left lobe is 2.3 cm in 7.3 Hounsfield units. Some were present previously and larger than previously and some are new. There are additional too small to characterize scattered hypodensities. Gallbladder and bile ducts are unremarkable. Pancreas: Unremarkable without contrast. Spleen: Unremarkable without contrast. Adrenals/Urinary Tract: No adrenal mass is seen. Small cyst again noted posteriorly in the upper pole left kidney. The renal cortex otherwise unremarkable without contrast. There are several scattered punctate nonobstructive caliceal stones in the upper to midpole right kidney, and scattered punctate up to 1-2 mm stones in the left renal collecting system. No hydronephrosis or ureteral stones are seen although please note the distal right ureter below the level of the acetabulum is obscured by metal artifact from a right hip arthroplasty. Bladder wall is thickened which was seen previously, but is not fully distended. Stomach/Bowel: No dilatation or wall thickening including the appendix. Moderate fecal stasis. There are colonic diverticula without evidence of colitis or diverticulitis. Vascular/Lymphatic: There is moderate aortoiliac calcific plaque. There is no AAA but there is ectasia in the common iliac arteries, which measure 1.7 cm on the right and 1.4 cm on the left. Scattered calcific plaque in the branch arteries. Reproductive: Enlarged prostate, measuring 4.5 cm with bladder impression. Other: Small umbilical fat hernia. No incarcerated hernia. No free air, hemorrhage or fluid. Musculoskeletal: There are chronic L5 pars defects without L5-S1 spondylolisthesis. No acute or significant osseous findings. Right hip arthroplasty. Left hip DJD. IMPRESSION: 1. There is nonobstructive micronephrolithiasis. No hydronephrosis or ureteral stone is seen, but with the most distal right ureter obscured by his  right hip arthroplasty. 2. Thickened bladder which could be due to nondistention,  hypertrophy or cystitis. 3. Prostatomegaly. 4. Aortic and coronary artery atherosclerosis. 5. Hepatic cysts and additional too small to characterize hypodensities. 6. Constipation with diverticulosis. 7. Umbilical fat hernia. Electronically Signed   By: Francis Quam M.D.   On: 12/27/2021 03:20    Assessment & Plan:   Problem List Items Addressed This Visit     Diabetes mellitus type 2, controlled (HCC)   On Ozempic       HTN (hypertension)   Blood pressure is good.  Continue on amlodipine , Coreg       Night sweats   Resolved       Hypogonadism male - Primary   He stopped Testosterone  inj due to local pain. On Jatenzo  just started      Relevant Orders   Comprehensive metabolic panel with GFR   Testosterone       No orders of the defined types were placed in this encounter.     Follow-up: Return in about 4 months (around 03/31/2025) for a follow-up visit.  Marolyn Noel, MD "

## 2024-12-10 ENCOUNTER — Ambulatory Visit: Payer: Medicare Other

## 2024-12-23 ENCOUNTER — Institutional Professional Consult (permissible substitution) (INDEPENDENT_AMBULATORY_CARE_PROVIDER_SITE_OTHER): Admitting: Otolaryngology

## 2024-12-28 ENCOUNTER — Ambulatory Visit: Admitting: Internal Medicine

## 2025-02-15 ENCOUNTER — Ambulatory Visit: Admitting: Allergy and Immunology

## 2025-03-31 ENCOUNTER — Ambulatory Visit: Admitting: Internal Medicine
